# Patient Record
Sex: Female | Born: 1970 | Race: Black or African American | Hispanic: No | Marital: Single | Smoking: Current every day smoker
Health system: Southern US, Community
[De-identification: ages and names within clinical notes are randomized; demographics above are authoritative.]

## PROBLEM LIST (undated history)

## (undated) ENCOUNTER — Ambulatory Visit

## (undated) DIAGNOSIS — F32A Depression, unspecified: Secondary | ICD-10-CM

## (undated) DIAGNOSIS — Z789 Other specified health status: Secondary | ICD-10-CM

## (undated) DIAGNOSIS — M1990 Unspecified osteoarthritis, unspecified site: Secondary | ICD-10-CM

## (undated) DIAGNOSIS — F1090 Alcohol use, unspecified, uncomplicated: Secondary | ICD-10-CM

## (undated) DIAGNOSIS — F319 Bipolar disorder, unspecified: Secondary | ICD-10-CM

## (undated) DIAGNOSIS — E119 Type 2 diabetes mellitus without complications: Secondary | ICD-10-CM

## (undated) DIAGNOSIS — T7840XA Allergy, unspecified, initial encounter: Secondary | ICD-10-CM

## (undated) DIAGNOSIS — I10 Essential (primary) hypertension: Secondary | ICD-10-CM

## (undated) DIAGNOSIS — E785 Hyperlipidemia, unspecified: Secondary | ICD-10-CM

## (undated) DIAGNOSIS — F329 Major depressive disorder, single episode, unspecified: Secondary | ICD-10-CM

## (undated) DIAGNOSIS — Z7289 Other problems related to lifestyle: Secondary | ICD-10-CM

## (undated) DIAGNOSIS — Z720 Tobacco use: Secondary | ICD-10-CM

## (undated) DIAGNOSIS — F419 Anxiety disorder, unspecified: Secondary | ICD-10-CM

## (undated) DIAGNOSIS — O24419 Gestational diabetes mellitus in pregnancy, unspecified control: Secondary | ICD-10-CM

## (undated) HISTORY — DX: Bipolar disorder, unspecified: F31.9

## (undated) HISTORY — DX: Gestational diabetes mellitus in pregnancy, unspecified control: O24.419

## (undated) HISTORY — DX: Other specified health status: Z78.9

## (undated) HISTORY — DX: Depression, unspecified: F32.A

## (undated) HISTORY — PX: WISDOM TOOTH EXTRACTION: SHX21

## (undated) HISTORY — DX: Other problems related to lifestyle: Z72.89

## (undated) HISTORY — DX: Essential (primary) hypertension: I10

## (undated) HISTORY — DX: Tobacco use: Z72.0

## (undated) HISTORY — DX: Hyperlipidemia, unspecified: E78.5

## (undated) HISTORY — DX: Unspecified osteoarthritis, unspecified site: M19.90

## (undated) HISTORY — DX: Anxiety disorder, unspecified: F41.9

## (undated) HISTORY — DX: Alcohol use, unspecified, uncomplicated: F10.90

## (undated) HISTORY — DX: Allergy, unspecified, initial encounter: T78.40XA

## (undated) HISTORY — DX: Major depressive disorder, single episode, unspecified: F32.9

---

## 1998-06-01 ENCOUNTER — Other Ambulatory Visit: Admission: RE | Admit: 1998-06-01 | Discharge: 1998-06-01 | Payer: Self-pay | Admitting: *Deleted

## 1998-07-20 ENCOUNTER — Ambulatory Visit (HOSPITAL_COMMUNITY): Admission: RE | Admit: 1998-07-20 | Discharge: 1998-07-20 | Payer: Self-pay | Admitting: Obstetrics and Gynecology

## 1998-12-06 ENCOUNTER — Encounter: Admission: RE | Admit: 1998-12-06 | Discharge: 1999-03-06 | Payer: Self-pay | Admitting: Obstetrics and Gynecology

## 1999-01-06 ENCOUNTER — Encounter: Payer: Self-pay | Admitting: Obstetrics and Gynecology

## 1999-01-06 ENCOUNTER — Ambulatory Visit (HOSPITAL_COMMUNITY): Admission: RE | Admit: 1999-01-06 | Discharge: 1999-01-06 | Payer: Self-pay | Admitting: Obstetrics and Gynecology

## 1999-01-19 ENCOUNTER — Encounter: Payer: Self-pay | Admitting: Obstetrics and Gynecology

## 1999-01-19 ENCOUNTER — Ambulatory Visit (HOSPITAL_COMMUNITY): Admission: RE | Admit: 1999-01-19 | Discharge: 1999-01-19 | Payer: Self-pay | Admitting: Obstetrics and Gynecology

## 1999-02-03 ENCOUNTER — Inpatient Hospital Stay (HOSPITAL_COMMUNITY): Admission: AD | Admit: 1999-02-03 | Discharge: 1999-02-05 | Payer: Self-pay | Admitting: Obstetrics and Gynecology

## 2001-07-23 HISTORY — PX: CHOLECYSTECTOMY: SHX55

## 2001-10-31 ENCOUNTER — Emergency Department (HOSPITAL_COMMUNITY): Admission: EM | Admit: 2001-10-31 | Discharge: 2001-10-31 | Payer: Self-pay | Admitting: Emergency Medicine

## 2002-01-24 ENCOUNTER — Emergency Department (HOSPITAL_COMMUNITY): Admission: EM | Admit: 2002-01-24 | Discharge: 2002-01-24 | Payer: Self-pay | Admitting: *Deleted

## 2002-01-24 ENCOUNTER — Encounter: Payer: Self-pay | Admitting: Emergency Medicine

## 2002-02-04 ENCOUNTER — Encounter (INDEPENDENT_AMBULATORY_CARE_PROVIDER_SITE_OTHER): Payer: Self-pay | Admitting: *Deleted

## 2002-02-04 ENCOUNTER — Observation Stay (HOSPITAL_COMMUNITY): Admission: RE | Admit: 2002-02-04 | Discharge: 2002-02-05 | Payer: Self-pay | Admitting: *Deleted

## 2002-07-28 ENCOUNTER — Other Ambulatory Visit: Admission: RE | Admit: 2002-07-28 | Discharge: 2002-07-28 | Payer: Self-pay | Admitting: Obstetrics and Gynecology

## 2005-08-21 ENCOUNTER — Other Ambulatory Visit: Admission: RE | Admit: 2005-08-21 | Discharge: 2005-08-21 | Payer: Self-pay | Admitting: Obstetrics and Gynecology

## 2006-05-08 ENCOUNTER — Ambulatory Visit: Payer: Self-pay | Admitting: General Practice

## 2006-06-26 ENCOUNTER — Ambulatory Visit: Payer: Self-pay | Admitting: Internal Medicine

## 2006-06-26 LAB — CONVERTED CEMR LAB
ALT: 20 units/L (ref 0–40)
AST: 21 units/L (ref 0–37)
Albumin: 3.8 g/dL (ref 3.5–5.2)
Alkaline Phosphatase: 45 units/L (ref 39–117)
BUN: 12 mg/dL (ref 6–23)
Basophils Absolute: 0 10*3/uL (ref 0.0–0.1)
Basophils Relative: 0.4 % (ref 0.0–1.0)
Bilirubin Urine: NEGATIVE
CO2: 28 meq/L (ref 19–32)
Calcium: 9.5 mg/dL (ref 8.4–10.5)
Chloride: 104 meq/L (ref 96–112)
Chol/HDL Ratio, serum: 4.9
Cholesterol: 171 mg/dL (ref 0–200)
Creatinine, Ser: 0.9 mg/dL (ref 0.4–1.2)
Eosinophil percent: 1.9 % (ref 0.0–5.0)
GFR calc non Af Amer: 76 mL/min
Glomerular Filtration Rate, Af Am: 92 mL/min/{1.73_m2}
Glucose, Bld: 115 mg/dL — ABNORMAL HIGH (ref 70–99)
HCT: 38.8 % (ref 36.0–46.0)
HDL: 34.9 mg/dL — ABNORMAL LOW (ref >39.0)
Hemoglobin: 12.9 g/dL (ref 12.0–15.0)
Ketones, ur: NEGATIVE mg/dL
LDL Cholesterol: 117 mg/dL — ABNORMAL HIGH (ref 0–99)
Leukocytes, UA: NEGATIVE
Lymphocytes Relative: 35.6 % (ref 12.0–46.0)
MCHC: 33.2 g/dL (ref 30.0–36.0)
MCV: 95.4 fL (ref 78.0–100.0)
Monocytes Absolute: 0.6 10*3/uL (ref 0.2–0.7)
Monocytes Relative: 9.7 % (ref 3.0–11.0)
Neutro Abs: 3.5 10*3/uL (ref 1.4–7.7)
Neutrophils Relative %: 52.4 % (ref 43.0–77.0)
Nitrite: NEGATIVE
Platelets: 436 10*3/uL — ABNORMAL HIGH (ref 150–400)
Potassium: 4.1 meq/L (ref 3.5–5.1)
RBC: 4.06 M/uL (ref 3.87–5.11)
RDW: 11.7 % (ref 11.5–14.6)
Sodium: 138 meq/L (ref 135–145)
Specific Gravity, Urine: 1.025 (ref 1.000–1.03)
TSH: 1.49 microintl units/mL (ref 0.35–5.50)
Total Bilirubin: 0.9 mg/dL (ref 0.3–1.2)
Total Protein, Urine: NEGATIVE mg/dL
Total Protein: 7 g/dL (ref 6.0–8.3)
Triglyceride fasting, serum: 96 mg/dL (ref 0–149)
Urine Glucose: NEGATIVE mg/dL
Urobilinogen, UA: 0.2 (ref 0.0–1.0)
VLDL: 19 mg/dL (ref 0–40)
WBC: 6.5 10*3/uL (ref 4.5–10.5)
pH: 5.5 (ref 5.0–8.0)

## 2006-07-01 ENCOUNTER — Ambulatory Visit: Payer: Self-pay | Admitting: Internal Medicine

## 2006-07-15 ENCOUNTER — Ambulatory Visit: Payer: Self-pay | Admitting: Internal Medicine

## 2006-08-27 ENCOUNTER — Ambulatory Visit: Payer: Self-pay | Admitting: Internal Medicine

## 2006-10-29 ENCOUNTER — Ambulatory Visit: Payer: Self-pay | Admitting: Internal Medicine

## 2007-01-07 ENCOUNTER — Ambulatory Visit: Payer: Self-pay | Admitting: Internal Medicine

## 2007-02-06 ENCOUNTER — Inpatient Hospital Stay (HOSPITAL_COMMUNITY): Admission: RE | Admit: 2007-02-06 | Discharge: 2007-02-11 | Payer: Self-pay | Admitting: *Deleted

## 2007-02-06 ENCOUNTER — Ambulatory Visit: Payer: Self-pay | Admitting: *Deleted

## 2007-02-06 ENCOUNTER — Ambulatory Visit: Payer: Self-pay | Admitting: Internal Medicine

## 2007-02-12 ENCOUNTER — Other Ambulatory Visit (HOSPITAL_COMMUNITY): Admission: RE | Admit: 2007-02-12 | Discharge: 2007-03-27 | Payer: Self-pay | Admitting: Psychiatry

## 2007-03-14 DIAGNOSIS — F17200 Nicotine dependence, unspecified, uncomplicated: Secondary | ICD-10-CM

## 2007-03-14 DIAGNOSIS — Z720 Tobacco use: Secondary | ICD-10-CM | POA: Insufficient documentation

## 2007-03-24 ENCOUNTER — Ambulatory Visit: Payer: Self-pay | Admitting: Psychiatry

## 2007-04-14 DIAGNOSIS — E1165 Type 2 diabetes mellitus with hyperglycemia: Secondary | ICD-10-CM | POA: Insufficient documentation

## 2007-05-14 ENCOUNTER — Ambulatory Visit: Payer: Self-pay | Admitting: General Practice

## 2007-05-14 DIAGNOSIS — F319 Bipolar disorder, unspecified: Secondary | ICD-10-CM

## 2007-05-29 ENCOUNTER — Ambulatory Visit: Payer: Self-pay | Admitting: Internal Medicine

## 2007-05-29 DIAGNOSIS — M25569 Pain in unspecified knee: Secondary | ICD-10-CM

## 2007-05-29 DIAGNOSIS — F1010 Alcohol abuse, uncomplicated: Secondary | ICD-10-CM | POA: Insufficient documentation

## 2007-05-29 LAB — CONVERTED CEMR LAB
BUN: 8 mg/dL (ref 6–23)
CO2: 27 meq/L (ref 19–32)
Calcium: 9.1 mg/dL (ref 8.4–10.5)
Chloride: 104 meq/L (ref 96–112)
Creatinine, Ser: 0.7 mg/dL (ref 0.4–1.2)
GFR calc Af Amer: 122 mL/min
GFR calc non Af Amer: 101 mL/min
Glucose, Bld: 95 mg/dL (ref 70–99)
Hgb A1c MFr Bld: 6.3 % — ABNORMAL HIGH (ref 4.6–6.0)
Potassium: 4.3 meq/L (ref 3.5–5.1)
Sodium: 138 meq/L (ref 135–145)
TSH: 1.21 microintl units/mL (ref 0.35–5.50)

## 2007-06-06 ENCOUNTER — Encounter (INDEPENDENT_AMBULATORY_CARE_PROVIDER_SITE_OTHER): Payer: Self-pay | Admitting: *Deleted

## 2007-07-10 ENCOUNTER — Ambulatory Visit: Payer: Self-pay | Admitting: Internal Medicine

## 2007-12-31 ENCOUNTER — Encounter: Payer: Self-pay | Admitting: Internal Medicine

## 2008-03-02 ENCOUNTER — Ambulatory Visit: Payer: Self-pay | Admitting: Internal Medicine

## 2008-03-04 ENCOUNTER — Telehealth: Payer: Self-pay | Admitting: Internal Medicine

## 2008-03-09 ENCOUNTER — Encounter: Payer: Self-pay | Admitting: Internal Medicine

## 2008-04-22 ENCOUNTER — Encounter: Payer: Self-pay | Admitting: Internal Medicine

## 2009-03-10 ENCOUNTER — Encounter: Admission: RE | Admit: 2009-03-10 | Discharge: 2009-03-10 | Payer: Self-pay | Admitting: Orthopedic Surgery

## 2009-08-05 ENCOUNTER — Ambulatory Visit: Payer: Self-pay | Admitting: Internal Medicine

## 2009-10-06 ENCOUNTER — Encounter: Payer: Self-pay | Admitting: Internal Medicine

## 2009-10-18 ENCOUNTER — Ambulatory Visit: Payer: Self-pay | Admitting: Internal Medicine

## 2009-10-18 LAB — CONVERTED CEMR LAB
Platelets: 447 10*3/uL
WBC, blood: 6.2 10*3/uL

## 2009-10-20 ENCOUNTER — Telehealth: Payer: Self-pay | Admitting: Internal Medicine

## 2010-01-12 ENCOUNTER — Encounter: Payer: Self-pay | Admitting: Internal Medicine

## 2010-01-12 ENCOUNTER — Telehealth: Payer: Self-pay | Admitting: Internal Medicine

## 2010-01-17 ENCOUNTER — Ambulatory Visit (HOSPITAL_BASED_OUTPATIENT_CLINIC_OR_DEPARTMENT_OTHER): Admission: RE | Admit: 2010-01-17 | Discharge: 2010-01-17 | Payer: Self-pay | Admitting: Internal Medicine

## 2010-01-17 ENCOUNTER — Ambulatory Visit: Payer: Self-pay | Admitting: Internal Medicine

## 2010-01-17 ENCOUNTER — Ambulatory Visit: Payer: Self-pay | Admitting: Interventional Radiology

## 2010-01-17 ENCOUNTER — Telehealth: Payer: Self-pay | Admitting: Internal Medicine

## 2010-01-17 DIAGNOSIS — M545 Low back pain: Secondary | ICD-10-CM

## 2010-01-17 DIAGNOSIS — R609 Edema, unspecified: Secondary | ICD-10-CM | POA: Insufficient documentation

## 2010-04-17 ENCOUNTER — Ambulatory Visit: Payer: Self-pay | Admitting: Internal Medicine

## 2010-05-31 ENCOUNTER — Encounter: Payer: Self-pay | Admitting: Internal Medicine

## 2010-06-20 ENCOUNTER — Encounter: Payer: Self-pay | Admitting: Internal Medicine

## 2010-08-07 ENCOUNTER — Encounter: Payer: Self-pay | Admitting: Internal Medicine

## 2010-08-07 ENCOUNTER — Ambulatory Visit
Admission: RE | Admit: 2010-08-07 | Discharge: 2010-08-07 | Payer: Self-pay | Source: Home / Self Care | Attending: Internal Medicine | Admitting: Internal Medicine

## 2010-08-07 LAB — CONVERTED CEMR LAB
BUN: 10 mg/dL (ref 6–23)
Calcium: 9.2 mg/dL (ref 8.4–10.5)
Creatinine, Ser: 0.84 mg/dL (ref 0.40–1.20)
Creatinine, Urine: 169.7 mg/dL
Microalb Creat Ratio: 3.5 mg/g (ref 0.0–30.0)

## 2010-08-08 ENCOUNTER — Encounter: Payer: Self-pay | Admitting: Internal Medicine

## 2010-08-20 LAB — CONVERTED CEMR LAB
BUN: 23 mg/dL
Calcium: 9.5 mg/dL
Cholesterol: 163 mg/dL
HDL: 46 mg/dL
LDL Cholesterol: 100 mg/dL
LDL/HDL Ratio: 100
TSH: 1.28 microintl units/mL
Triglyceride fasting, serum: 87 mg/dL

## 2010-08-24 NOTE — Progress Notes (Signed)
Summary: Rx.  Phone Note Refill Request Message from:  Patient on October 20, 2009 11:09 AM  Refills Requested: Medication #1:  VOLTAREN 1 % GEL apply three times a day as directed.   Dosage confirmed as above?Dosage Confirmed Her insurance wants her to use the pill instead of gel.Marland KitchenMarland KitchenMarland KitchenPlease call pharmacy, so pt. can pick up.   Initial call taken by: Michaelle Copas,  October 20, 2009 11:09 AM  Follow-up for Phone Call        I suggest she use over the counter NSAIDs - iburofen or aleve.  use sparingly.  take with food and plenty of fluids.  avoid long term use - > 2 weeks Follow-up by: D. Thomos Lemons DO,  October 20, 2009 11:45 AM  Additional Follow-up for Phone Call Additional follow up Details #1::        Called and informed pt. of Dr.Leotha Westermeyer's directions Additional Follow-up by: Michaelle Copas,  October 20, 2009 11:56 AM

## 2010-08-24 NOTE — Assessment & Plan Note (Signed)
Summary: [name]   Vital Signs:  Patient profile:   40 year old female Height:      67 inches Weight:      255.25 pounds BMI:     40.12 O2 Sat:      98 % on Room air Temp:     98.2 degrees F oral Pulse rate:   72 / minute Pulse rhythm:   regular Resp:     16 per minute BP sitting:   128 / 80  (right arm) Cuff size:   large  Vitals Entered By: Tanya Wilcox CMA (January 17, 2010 10:08 AM)  O2 Flow:  Room air CC: Rm 2 Right foot swelling Is Patient Diabetic? No   Primary Care Provider:  Dondra Spry DO  CC:  Rm 2 Right foot swelling.  History of Present Illness: right foot swelling for the past month , pian with extension and rotation, pain has improved, swelling does occur daily, some elevation and rubbing of alchol with relief, Lamotrigine will increaase to 300mg  and refill on test strips needed to local and mail order  Preventive Screening-Counseling & Management  Alcohol-Tobacco     Smoking Status: current  Allergies: No Known Drug Allergies  Past History:  Past Medical History: Bipolar Alcohol use  History of gestational diabetes  Tobacco abuse   History of cannabis use Borderline DM II Left knee osteoarthritis  Past Surgical History: Cholecystectomy - 2003     Family History: Mother has hypertension and high cholesterol. Father deceased age 36 - history of alcohol abuse ( died in MVA )      Social History: Single Current Smoker - 1/4 pack day  Occupation:[employer]  Alcohol - occasionally Drug use-no     Review of Systems       right sided low back pain.  no dysuria.  " I can feel it when I urinate".  no fever or chills  Physical Exam  General:  alert, well-developed, and well-nourished.   Lungs:  normal respiratory effort and normal breath sounds.   Heart:  normal rate, regular rhythm, and no gallop.   Abdomen:  soft, non-tender, and normal bowel sounds.   no flank tenderness Extremities:  trace left pedal edema and 1+  right pedal edema.     Impression & Recommendations:  Problem # 1:  EDEMA (ICD-782.3) pt c/o right lower ext / ankle swelling.  she denies trauma or injury.  probable venous insuff.  rule out DVT rx for compression stocking provided Orders: LE Venous Duplex (DVT) (DVT)  Problem # 2:  DIABETES MELLITUS, TYPE II, BORDERLINE (ICD-790.29) some wt gain since prev visit.  she sometimes forgets to take evening dose.  she has pill box Pt counseled on diet and exercise.  Her updated medication list for this problem includes:    Metformin Hcl 1000 Mg Tabs (Metformin hcl) ..... One by mouth bid  Orders: T-Basic Metabolic Panel [id number]) T- Hemoglobin A1C (09811-91478) T-Urinalysis (81003-65000)  Problem # 3:  BIPOLAR AFFECTIVE DISORDER (ICD-296.80) pt c/o fatigue.  lamictal titrated to 300 mg by pysch.  rule to anemia.  lamictal can be assoc with pancytopenia Orders: T-CBC w/Diff ([id number])  Problem # 4:  BACK PAIN, RIGHT (ICD-724.5) intermittent right sided back pain. probable spondylosis.  use OTC analgesics as needed check u/a  Complete Medication List: 1)  Abilify 15 Mg Tabs (Aripiprazole) .... Take 1 tablet by mouth once a day 2)  Onetouch Ultra Test Strp (Glucose blood) .... Test daily  3)  Metformin Hcl 1000 Mg Tabs (Metformin hcl) .... One by mouth bid 4)  Zolpidem Tartrate 5 Mg Tabs (Zolpidem tartrate) .... One by mouth at bedtime prn 5)  Lamotrigine 200 Mg Tabs (Lamotrigine) .... One by mouth once daily 6)  Voltaren 1 % Gel (Diclofenac sodium) .... Apply three times a day as directed  Patient Instructions: 1)  www.diabetes.org 2)  Limit carbs to 30 grams per meal 3)  Keep your Sept appointment 4)  Limit your sodium intake 2.5 grams per day Prescriptions: METFORMIN HCL 1000 MG TABS (METFORMIN HCL) one by mouth bid  #180 x 1   Entered and Authorized by:   D. Thomos Lemons DO   Signed by:   D. Thomos Lemons DO on 01/17/2010   Method used:   Electronically to         [facility]* (mail-order)       [facility]       [address]       [place], [place]  [address]       Ph: [phone]       Fax: [phone]   RxID:   [id number] [place] TEST   STRP (GLUCOSE BLOOD) test daily  #100 x 3   Entered and Authorized by:   D. Thomos Lemons DO   Signed by:   D. Thomos Lemons DO on 01/17/2010   Method used:   Electronically to        [facility]* (mail-order)       [facility]       [address]       [place], [place]  [address]       Ph: [phone]       Fax: [phone]   RxID:   [id number]

## 2010-08-24 NOTE — Letter (Signed)
   [place] [facility] [address] [place], [place]  [address]  [place] Phone: [phone]      August 08, 2010   [facility] [address] [place], [place] [address]  RE:  LAB RESULTS  Dear  Ms. Agresta,  The following is an interpretation of your most recent lab tests.  Please take note of any instructions provided or changes to medications that have resulted from your lab work.  ELECTROLYTES:  Good - no changes needed  KIDNEY FUNCTION TESTS:  Good - no changes needed    DIABETIC STUDIES:  Fair - schedule a follow-up appointment Blood Glucose: 132   HgbA1C: 7.1   Microalbumin/Creatinine Ratio: 3.5          Sincerely Yours,    Dr. Thomos Lemons  Appended Document:  mailed

## 2010-08-24 NOTE — Assessment & Plan Note (Signed)
Summary: 6 MONTH FOLLOW UP/MHF   Vital Signs:  Patient profile:   40 year old female Weight:      258 pounds BMI:     40.55 O2 Sat:      100 % on Room air Temp:     98.4 degrees F oral Pulse rate:   69 / minute Pulse rhythm:   regular Resp:     20 per minute BP sitting:   120 / 68  (right arm) Cuff size:   large  Vitals Entered By: Glendell Docker CMA (April 17, 2010 3:24 PM)  O2 Flow:  Room air CC: 6 month follow up  Is Patient Diabetic? No Pain Assessment Patient in pain? no      Comments flu shot to be given with employer   Primary Care Provider:  D. Thomos Lemons DO  CC:  6 month follow up .  History of Present Illness: 40 y/o AA female with hx of bipolar and borderline DM for f/u some wt gain since prev visit  Current Diet: Breakfast:  [place] sandwich Lunch: [place] sandwich, chips and soft drink DInner: chicken breast , [place],  spaghetti,  hamburger helper Snacks: infrequent Beverage: soft drinks     Preventive Screening-Counseling & Management  Alcohol-Tobacco     Smoking Status: current  Allergies (verified): No Known Drug Allergies  Past History:  Past Medical History: Bipolar Alcohol use  History of gestational diabetes   Tobacco abuse   History of cannabis use Borderline DM II Left knee osteoarthritis  Social History: Single (daughter - age 39) [employer] Current Smoker - 1/4 pack day  Occupation: [employer]  Alcohol - occasionally Drug use-no      Physical Exam  General:  alert, well-developed, and well-nourished.   Lungs:  normal respiratory effort and normal breath sounds.   Heart:  normal rate, soft blowing murmur LSB   Impression & Recommendations:  Problem # 1:  DIABETES MELLITUS, TYPE II, BORDERLINE (ICD-790.29) recent A1c reported at 6.1 Pt counseled on diet and exercise.  Her updated medication list for this problem includes:    Metformin Hcl 1000 Mg Tabs (Metformin hcl) ..... One by mouth bid  Labs  Reviewed: Creat: 0.7 (05/29/2007)     Last Eye Exam: normal (01/12/2010)  Problem # 2:  BIPOLAR AFFECTIVE DISORDER (ICD-296.80) depakote may be contributing to wt gain  Complete Medication List: 1)  Onetouch Ultra Test Strp (Glucose blood) .... Test daily 2)  Metformin Hcl 1000 Mg Tabs (Metformin hcl) .... One by mouth bid 3)  Zolpidem Tartrate 5 Mg Tabs (Zolpidem tartrate) .... One by mouth at bedtime prn 4)  Lamictal 25 Mg Tabs (Lamotrigine) .... 2 tablets by mouth at bedtime 5)  Voltaren 1 % Gel (Diclofenac sodium) .... Apply three times a day as directed 6)  Wellbutrin Xl 300 Mg Xr24h-tab (Bupropion hcl) .... Take 1 tablet by mouth every morning 7)  Depakote 500 Mg Tbec (Divalproex sodium) .... 2 tablets by mouth at bedtime  Patient Instructions: 1)  Please schedule a follow-up appointment in 4 months.  Current Allergies (reviewed today): No known allergies

## 2010-08-24 NOTE — Miscellaneous (Signed)
Summary: Orders Update  Clinical Lists Changes  Orders: Added new Test order of TLB-BMP (Basic Metabolic Panel-BMET) (80048-METABOL) - Signed Added new Test order of TLB-A1C / Hgb A1C (Glycohemoglobin) (83036-A1C) - Signed 

## 2010-08-24 NOTE — Progress Notes (Signed)
Summary: Test Results  Phone Note Outgoing Call   Summary of Call: call pt - lower ext doppler negative for blood clot Initial call taken by: D. Thomos Lemons DO,  January 17, 2010 5:37 PM  Follow-up for Phone Call        patient advised per Dr Artist Pais instructions Follow-up by: Glendell Docker CMA,  January 18, 2010 8:28 AM

## 2010-08-24 NOTE — Assessment & Plan Note (Signed)
Summary: Medication refills/hea   Vital Signs:  Patient profile:   40 year old female Height:      67 inches Weight:      244 pounds BMI:     38.35 O2 Sat:      100 % on Room air Temp:     98.1 degrees F oral Pulse rate:   82 / minute Pulse rhythm:   regular Resp:     18 per minute BP sitting:   120 / 76  (right arm) Cuff size:   large  Vitals Entered By: Glendell Docker CMA (August 05, 2009 2:30 PM)  O2 Flow:  Room air  Primary Care Provider:  D. Thomos Lemons DO  CC:  Follow up Medication.  History of Present Illness: Follow up medication  40 y/o AA female with hx of mood d/o (bipolar) and borerline diabetes for f/u she has not taken any medication for the past 6 months she is monitoring her blood sugar.   CBG yesterday - 112  Preventive Screening-Counseling & Management  Alcohol-Tobacco     Smoking Status: current      Drug Use:  no.    Allergies (verified): No Known Drug Allergies  Past History:  Past Medical History: Bipolar Alcohol use  History of gestational diabetes Tobacco abuse  History of cannabis use  Past Surgical History: Cholecystectomy - 2003    Family History: Mother has hypertension and high cholesterol. Father deceased age 21 - history of alcohol abuse ( died in MVA )   Social History: Single Current Smoker - 1/4 pack day Occupation:[employer]  Alcohol - occasionally Drug use-no Drug Use:  no  Review of Systems       chronic insomnia  Physical Exam  General:  alert and overweight-appearing.   Neck:  supple and no masses.   Lungs:  normal respiratory effort and normal breath sounds.   Heart:  normal rate, regular rhythm, no murmur, and no gallop.   Extremities:  No lower extremity edema' Neurologic:  cranial nerves II-XII intact and gait normal.   Psych:  normally interactive, good eye contact, and slightly anxious.     Impression & Recommendations:  Problem # 1:  BIPOLAR AFFECTIVE DISORDER (ICD-296.80) Pt stopped  sertraline on her own.   she noticed increased tearfulness and irritability.  Restart sertraline.  arrange f/u with psych for long term mgt.  med compliance encouraged.  no new life stressor.  Orders: Psychiatric Referral (Psych)  Problem # 2:  DIABETES MELLITUS, TYPE II, BORDERLINE (ICD-790.29) resume metformin.  monitor a1c.  Her updated medication list for this problem includes:    Metformin Hcl 500 Mg Tabs (Metformin hcl) ..... One by mouth bid  Labs Reviewed: Creat: 0.7 (05/29/2007)     Problem # 3:  TOBACCO ABUSE (ICD-305.1)  Complete Medication List: 1)  Abilify 5 Mg Tabs (Aripiprazole) .... One by mouth at bedtime 2)  Sertraline Hcl 50 Mg Tabs (Sertraline hcl) .... One half by mouth once daily x 7 days, then one by mouth qd 3)  Onetouch Ultra Test Strp (Glucose blood) .... Test daily 4)  Metformin Hcl 500 Mg Tabs (Metformin hcl) .... One by mouth bid 5)  Zolpidem Tartrate 5 Mg Tabs (Zolpidem tartrate) .... One by mouth at bedtime prn  Patient Instructions: 1)  Please schedule a follow-up appointment in 2 months. Prescriptions: ZOLPIDEM TARTRATE 5 MG TABS (ZOLPIDEM TARTRATE) one by mouth at bedtime prn  #30 x 1   Entered and Authorized by:  Dondra Spry DO   Signed by:   D. Thomos Lemons DO on 08/05/2009   Method used:   Print then Give to Patient   RxID:   [id number] SERTRALINE HCL 50 MG TABS (SERTRALINE HCL) one half by mouth once daily x 7 days, then one by mouth qd  #30 x 2   Entered and Authorized by:   D. Thomos Lemons DO   Signed by:   D. Thomos Lemons DO on 08/05/2009   Method used:   Print then Give to Patient   RxID:   [id number] METFORMIN HCL 500 MG TABS (METFORMIN HCL) one by mouth bid  #180 x 1   Entered and Authorized by:   D. Thomos Lemons DO   Signed by:   D. Thomos Lemons DO on 08/05/2009   Method used:   Print then Give to Patient   RxID:   [id number]   Current Allergies (reviewed today): No known allergies    Immunization  History:  Influenza Immunization History:    Influenza:  historical (04/12/2009)

## 2010-08-24 NOTE — Letter (Signed)
Summary: [facility]   Imported By: Lanelle Bal 01/19/2010 16:17:09  _____________________________________________________________________  External Attachment:    Type:   Image     Comment:   External Document

## 2010-08-24 NOTE — Miscellaneous (Signed)
Summary: eye exam update  Clinical Lists Changes  Observations: Added new observation of DMEYEEXAMNXT: 01/2011 (01/12/2010 14:02) Added new observation of DMEYEEXMRES: normal (01/12/2010 14:02) Added new observation of EYE EXAM BY: Fredrich Birks, OD [phone] (01/12/2010 14:02) Added new observation of DIAB EYE EX: normal (01/12/2010 14:02)        Diabetes Management Exam:    Eye Exam:       Eye Exam done elsewhere          Date: 01/12/2010          Results: normal          Done by: Fredrich Birks, OD [phone]

## 2010-08-24 NOTE — Assessment & Plan Note (Signed)
Summary: 2 mo. f/u, cpx-jr   Vital Signs:  Patient profile:   40 year old female Height:      67 inches Weight:      251 pounds BMI:     39.45 O2 Sat:      100 % on Room air Temp:     98.6 degrees F oral Pulse rate:   73 / minute Pulse rhythm:   regular Resp:     16 per minute BP sitting:   114 / 70  (right arm) Cuff size:   large  Vitals Entered By: Glendell Docker CMA (October 18, 2009 2:35 PM)  O2 Flow:  Room air  Primary Care Provider:  D. Thomos Lemons DO   History of Present Illness: 40 y/o with bipolar, abnormal glucose for routine cpx she reestablished with Dr. Tomasa Rand lamictal added. she is feeling better  DM II borderline.  she is having tough time controlling her appetite reviewed labs  a1c stable at 6.1 lipids - LDL < 100  Preventive Screening-Counseling & Management  Alcohol-Tobacco     Smoking Status: current  Allergies (verified): No Known Drug Allergies  Past History:  Past Medical History: Bipolar Alcohol use  History of gestational diabetes Tobacco abuse   History of cannabis use Borderline DM II Left knee osteoarthritis  Family History: Mother has hypertension and high cholesterol. Father deceased age 34 - history of alcohol abuse ( died in MVA )     Social History: Single Current Smoker - 1/4 pack day  Occupation:[employer]  Alcohol - occasionally Drug use-no   Review of Systems  The patient denies weight loss, weight gain, chest pain, syncope, dyspnea on exertion, headaches, abdominal pain, melena, hematochezia, and severe indigestion/heartburn.         intermittent left knee pain.  better with voltaren gel.  she was seen by ortho - knee pain from DJD  Physical Exam  General:  alert and overweight-appearing.   Head:  normocephalic and atraumatic.   Eyes:  pupils equal, pupils round, and pupils reactive to light.   Ears:  R ear normal and L ear normal.   Mouth:  Oral mucosa and oropharynx without lesions or exudates.  Teeth  in good repair. Neck:  No deformities, masses, or tenderness noted. Lungs:  Normal respiratory effort, chest expands symmetrically. Lungs are clear to auscultation, no crackles or wheezes. Heart:  Normal rate and regular rhythm. S1 and S2 normal without gallop, murmur, click, rub or other extra sounds. Abdomen:  obese, soft, non-tender, normal bowel sounds, no masses, no hepatomegaly, and no splenomegaly.   Extremities:  No lower extremity edema  Neurologic:  cranial nerves II-XII intact and gait normal.     Impression & Recommendations:  Problem # 1:  PREVENTIVE HEALTH CARE (ICD-V70.0) Reviewed adult health maintenance protocols.  pt counseled to quit smoking - 3-5 minutes.  she is planning on using nicotine patches.  nicotine lozenges cause sore throat  Pap smear: normal (09/06/2009) Flu Vax: Historical (04/12/2009)   Pneumovax: Pneumovax (05/29/2007) Chol: 171 (06/26/2006)   HDL: 34.9 (06/26/2006)   LDL: 117 (06/26/2006)   TG: 96 (06/26/2006) TSH: 1.21 (05/29/2007)   HgbA1C: 6.3 (05/29/2007)     Problem # 2:  DIABETES MELLITUS, TYPE II, BORDERLINE (ICD-790.29) A1c 6.1.  increase metformin to 1 gram by mouth two times a day.  carb counting guide provide.  limit carbs to  Her updated medication list for this problem includes:    Metformin Hcl 1000 Mg Tabs (Metformin hcl) .Marland KitchenMarland KitchenMarland KitchenMarland Kitchen  One by mouth bid  Labs Reviewed: Creat: 0.7 (05/29/2007)     Complete Medication List: 1)  Abilify 15 Mg Tabs (Aripiprazole) .... Take 1 tablet by mouth once a day 2)  Onetouch Ultra Test Strp (Glucose blood) .... Test daily 3)  Metformin Hcl 1000 Mg Tabs (Metformin hcl) .... One by mouth bid 4)  Zolpidem Tartrate 5 Mg Tabs (Zolpidem tartrate) .... One by mouth at bedtime prn 5)  Lamotrigine 200 Mg Tabs (Lamotrigine) .... One by mouth once daily 6)  Voltaren 1 % Gel (Diclofenac sodium) .... Apply three times a day as directed  Patient Instructions: 1)  Please schedule a follow-up appointment in 6  months. 2)  BMP prior to visit, ICD-9:  790.29 3)  HbgA1C prior to visit, ICD-9:  790.29 4)  [email or web address] 5)  Please return for lab work one (1) week before your next appointment.  Prescriptions: METFORMIN HCL 1000 MG TABS (METFORMIN HCL) one by mouth bid  #180 x 1   Entered and Authorized by:   D. Thomos Lemons DO   Signed by:   D. Thomos Lemons DO on 10/18/2009   Method used:   Electronically to        [facility] [address] [phone]* (retail)       [address] [place], [place]  [address]       Ph: [phone]       Fax: [phone]   RxID:   [id number] VOLTAREN 1 % GEL (DICLOFENAC SODIUM) apply three times a day as directed  #3 pk x 5   Entered and Authorized by:   D. Thomos Lemons DO   Signed by:   D. Thomos Lemons DO on 10/18/2009   Method used:   Electronically to        [facility] [address] [phone]* (retail)       [address] [place], [place]  [address]       Ph: [phone]       Fax: [phone]   RxID:   [id number] METFORMIN HCL 1000 MG TABS (METFORMIN HCL) one by mouth bid  #60 x 5   Entered and Authorized by:   D. Thomos Lemons DO   Signed by:   D. Thomos Lemons DO on 10/18/2009   Method used:   Electronically to        [facility] [address] [phone]* (retail)       [address] [place], [place]  [address]       Ph: [phone]       Fax: [phone]   RxID:   [id number]    Preventive Care Screening  Pap Smear:    Date:  09/06/2009    Results:  normal      Current Allergies (reviewed today): No known allergies    Appended Document: Orders Update    Clinical Lists Changes  Orders: Added new Service order of Tdap => 79yrs IM (37169) - Signed Added new Service order of Admin 1st Vaccine (67893) - Signed Observations: Added new observation of TD BOOST VIS: 06/10/07 version given October 18, 2009. (10/18/2009 15:44) Added new observation of TD BOOSTERLO: [id number] (10/18/2009  15:44) Added new observation of TD BOOST EXP: 09/17/2011 (10/18/2009 15:44) Added new observation of TD BOOSTERBY: Glendell Docker CMA (10/18/2009 15:44) Added new observation of TD BOOSTERRT: IM (10/18/2009 15:44) Added new observation of TDBOOSTERDSE: 0.5 ml (10/18/2009 15:44) Added  new observation of TD BOOSTERMF: GlaxoSmithKline (10/18/2009 15:44) Added new observation of TD BOOST SIT: left deltoid (10/18/2009 15:44) Added new observation of TD BOOSTER: Tdap (10/18/2009 15:44)       Immunizations Administered:  Tetanus Vaccine:    Vaccine Type: Tdap    Site: left deltoid    Mfr: GlaxoSmithKline    Dose: 0.5 ml    Route: IM    Given by: Glendell Docker CMA    Exp. Date: 09/17/2011    Lot #: [id number]    VIS given: 06/10/07 version given October 18, 2009.

## 2010-08-24 NOTE — Miscellaneous (Signed)
Summary: Lab Results  Clinical Lists Changes  Observations: Added new observation of PLATELETS: 447 10*3/mm3 (10/18/2009 15:47) Added new observation of HCT: 38.7 % (10/18/2009 15:47) Added new observation of HGB: 13.3 g/dL (84/69/6295 28:41) Added new observation of WBC: 6.2 10*3/mm3 (10/18/2009 15:47) Added new observation of TSH: 1.280 microintl units/mL (10/06/2009 16:04) Added new observation of CALCIUM: 9.5 mg/dL (32/44/0102 72:53) Added new observation of BUN: 23 mg/dL (66/44/0347 42:59) Added new observation of CO2 TOTAL: 22 mmol/L (10/06/2009 16:00) Added new observation of CHLORIDE: 100 mmol/L (10/06/2009 16:00) Added new observation of POTASSIUM: 4.7 mmol/L (10/06/2009 16:00) Added new observation of SODIUM: 136 mmol/L (10/06/2009 16:00) Added new observation of HGBA1C: 6.1 % (10/06/2009 15:54) Added new observation of C-LDL/C-HDL: 100  (10/06/2009 15:54) Added new observation of TRIGLYCERIDE: 87 mg/dL (56/38/7564 33:29) Added new observation of HDL: 46 mg/dL (51/88/4166 06:30) Added new observation of LDL: 100 mg/dL (16/07/930 35:57) Added new observation of CHOLESTEROL: 163 mg/dL (32/20/2542 70:62)      Lipid Panel Test Date: 10/06/2009                        Value        Units        H/L   Reference  Cholesterol:          163          mg/dL              (376-283) LDL Cholesterol:      100          mg/dL              (15-176) HDL Cholesterol:      46           mg/dL              (16-07) Triglyceride:         87           mg/dL              (37-106) LDL/HDL ratio:        100                        H    (2-5) HgBA1c                6.1                              Lab name:             [facility]    Lab Results  CBC               Normals    WBC:     6.2   x10E3/uL   (4.8 - 10.8)     HGB:     13.3   g/dL     (26.9 - 48.5)    HCT:     38.7   %     (37 - 47)    PLT:     447   x10E3/uL   (130 - 440)      Lab Results  CBC               Normals    WBC:      6.2   x10E3/uL   (4.8 - 10.8)     HGB:     13.3  g/dL     (40.9 - 81.1)    HCT:     38.7   %     (37 - 47)    PLT:     447   x10E3/uL   (130 - 440)          Chemistry Labs Test Date: 10/06/2009                      Value Units        H/L   Reference  Sodium:             136   mmol/L        L    (137-145) Potassium:          4.7   mmol/L             (3.6-5.0) Chloride:           100   mmol/L        L    (101-111) CO2:                22    mmol/L             (22-31) BUN:                22    mg/dL              (9-14) Creatinine:         0.96  mg/dL              (7.8-2.9) Glucose-fasting:    107   mg/dL              (56-213) Calcium (total):    9.5   mg/dL              (0-86.5)  Lab name:           [facility]      Lab Entry Test Date: 10/06/2009                        Value        Units        H/L   Reference  TSH:                  1.280        mIU/ml             (0.5-5.0)  Lab name:             [facility]

## 2010-08-24 NOTE — Medication Information (Signed)
Summary: Nonadherence with Metformin/[facility]  Nonadherence with Metformin/[facility]   Imported By: Lanelle Bal 07/20/2010 15:05:14  _____________________________________________________________________  External Attachment:    Type:   Image     Comment:   External Document

## 2010-08-24 NOTE — Progress Notes (Signed)
Summary: labs mailed  Phone Note Call from Patient Call back at [phone]   Caller: Patient Summary of Call: Pt called and requested that lab results from March be mailed to her home address.  Results mailed. Attempted to call pt at home and notify her that labs had been mailed but received poor connection and call was lost.  Mervin Kung CMA  January 12, 2010 5:32 PM

## 2010-08-29 LAB — HM PAP SMEAR: HM Pap smear: NORMAL

## 2010-08-30 NOTE — Assessment & Plan Note (Signed)
Summary: 4 MONTH FOLLOW UP/MHF   Vital Signs:  Patient profile:   40 year old female Height:      67 inches Weight:      266.50 pounds BMI:     41.89 O2 Sat:      100 % on Room air Temp:     98.2 degrees F oral Pulse rate:   69 / minute Resp:     18 per minute BP sitting:   122 / 80  (right arm) Cuff size:   large  Vitals Entered By: Glendell Docker CMA (August 07, 2010 10:41 AM)  O2 Flow:  Room air CC: 4 Month Follow up  Is Patient Diabetic? No Pain Assessment Patient in pain? no      Comments concerned with elevated blood sugar greater than 300, in morning 160-170, after meals runs about 260 and up, 7 day avg 177 & 30 day average 193, non-fasting this am   Primary Care Provider:  Dondra Spry DO  CC:  4 Month Follow up .  History of Present Illness: 40 y/o female for f/u re:  dM II blood sugar much higher than normal she denies signs or symptoms of infection sub optimal diet  Preventive Screening-Counseling & Management  Alcohol-Tobacco     Smoking Status: current  Allergies (verified): No Known Drug Allergies  Past History:  Past Medical History: Bipolar Alcohol use  History of gestational diabetes   Tobacco abuse    History of cannabis use Borderline DM II Left knee osteoarthritis  Past Surgical History: Cholecystectomy - 2003      Family History: Mother has hypertension and high cholesterol. Father deceased age 81 - history of alcohol abuse ( died in MVA )       Social History: Single (daughter - age 73) [facility] Current Smoker - 1/4 pack day  Occupation: [employer]  Alcohol - occasionally  Drug use-no      Physical Exam  General:  alert, well-developed, and well-nourished.   Lungs:  normal respiratory effort and normal breath sounds.   Heart:  normal rate, regular rhythm, and no gallop.   Extremities:  trace left pedal edema and trace right pedal edema.     Impression & Recommendations:  Problem # 1:  DIABETES MELLITUS, TYPE II  (ICD-250.00) Assessment Deteriorated add victoza we discussed potential side effects  Her updated medication list for this problem includes:    Metformin Hcl 1000 Mg Tabs (Metformin hcl) ..... One by mouth bid    Victoza 18 Mg/64ml Soln (Liraglutide) ..... Inject 1.2 mg subcutaneously once daily  Labs Reviewed: Creat: 0.7 (05/29/2007)     Last Eye Exam: normal (01/12/2010) Reviewed HgBA1c results: 6.1 (10/06/2009)  6.3 (05/29/2007)  Complete Medication List: 1)  Onetouch Ultra Test Strp (Glucose blood) .... Test daily 2)  Metformin Hcl 1000 Mg Tabs (Metformin hcl) .... One by mouth bid 3)  Lamictal 25 Mg Tabs (Lamotrigine) .... 2 tablets by mouth at bedtime 4)  Wellbutrin Xl 300 Mg Xr24h-tab (Bupropion hcl) .... Take 1 tablet by mouth every morning 5)  Depakote 500 Mg Tbec (Divalproex sodium) .... 3  tablets by mouth at bedtime 6)  Victoza 18 Mg/31ml Soln (Liraglutide) .... Inject 1.2 mg subcutaneously once daily 7)  Bd Pen Needle Short U/f 31g X 8 Mm Misc (Insulin pen needle) .... Use once daily as directed  Other Orders: T-Basic Metabolic Panel [phone]) T- Hemoglobin A1C ([id number]) T-Urine Microalbumin w/creat. ratio [phone])  Patient Instructions: 1)  Please schedule a  follow-up appointment in 1 month. 2)  Limit your carbs to 25-30 grams per meal Prescriptions: BD PEN NEEDLE SHORT U/F 31G X 8 MM MISC (INSULIN PEN NEEDLE) use once daily as directed  #30 x 0   Entered and Authorized by:   D. Thomos Lemons DO   Signed by:   D. Thomos Lemons DO on 08/07/2010   Method used:   Electronically to        [facility] [phone]* (retail)       [address] [place], [place]  [address]       Ph: [phone]       Fax: [phone]   RxID:   [id number] VOZDGUY 18 MG/3ML SOLN (LIRAGLUTIDE) inject 1.2 mg Subcutaneously once daily  #1 x 0   Entered and Authorized by:   D. Thomos Lemons DO   Signed by:   D. Thomos Lemons DO on 08/07/2010   Method used:    Samples Given   RxID:   [id number]    Orders Added: 1)  T-Basic Metabolic Panel [[id number]] 2)  T- Hemoglobin A1C [83036-23375] 3)  T-Urine Microalbumin w/creat. ratio [[id number]] 4)  Est. Patient Level III [43329]    Current Allergies (reviewed today): No known allergies

## 2010-09-08 ENCOUNTER — Encounter: Payer: Self-pay | Admitting: Internal Medicine

## 2010-09-08 ENCOUNTER — Ambulatory Visit (INDEPENDENT_AMBULATORY_CARE_PROVIDER_SITE_OTHER): Payer: 59 | Admitting: Internal Medicine

## 2010-09-08 DIAGNOSIS — M545 Low back pain, unspecified: Secondary | ICD-10-CM

## 2010-09-08 DIAGNOSIS — E119 Type 2 diabetes mellitus without complications: Secondary | ICD-10-CM

## 2010-09-11 ENCOUNTER — Encounter: Payer: Self-pay | Admitting: Internal Medicine

## 2010-09-13 NOTE — Assessment & Plan Note (Signed)
Summary: 1 MONTH FOLLOW UP/MHF   Vital Signs:  Patient profile:   40 year old female Height:      67 inches Weight:      259 pounds BMI:     40.71 O2 Sat:      100 % on Room air Temp:     98.3 degrees F oral Pulse rate:   91 / minute Resp:     18 per minute BP sitting:   122 / 80  (left arm) Cuff size:   large  Vitals Entered By: Glendell Docker CMA (September 08, 2010 9:30 AM)  O2 Flow:  Room air CC: 1 month Follow , Type 2 diabetes mellitus follow-up Is Patient Diabetic? Yes Did you bring your meter with you today? Yes Pain Assessment Patient in pain? no      Comments c/o left low back discomfort for the past 4 months, Victoza causing her to have low blood sugars with the Metformin when she increased the dose, 7 day avg 172, 14 day 157, 30 day 132,patient states she has switched to electronic cigarette   Primary Care Provider:  DThomos Lemons DO  CC:  1 month Follow  and Type 2 diabetes mellitus follow-up.  History of Present Illness:  Type 2 Diabetes Mellitus Follow-Up      This is a 40 year old woman who presents for Type 2 diabetes mellitus follow-up.  The patient denies self managed hypoglycemia, hypoglycemia requiring help, and weight gain.  Since the last visit the patient reports good dietary compliance and compliance with medications.   one episode of significant nausea in AM after taking victoza.  no abd pain.  no vomiting  occ left low back pain.  can radiate to left leg.  no severe pain.  worse with prolonged standing no lowere ext weakness  Preventive Screening-Counseling & Management  Alcohol-Tobacco     Smoking Status: quit  Allergies (verified): No Known Drug Allergies  Past History:  Past Medical History: Bipolar Alcohol use  History of gestational diabetes    Tobacco abuse    History of cannabis use Borderline DM II Left knee osteoarthritis  Social History: Smoking Status:  quit  Physical Exam  General:  alert, well-developed, and  well-nourished.   Lungs:  normal respiratory effort and normal breath sounds.   Heart:  normal rate, regular rhythm, and no gallop.   Extremities:  No lower extremity edema Neurologic:  cranial nerves II-XII intact and gait normal.     Impression & Recommendations:  Problem # 1:  DIABETES MELLITUS, TYPE II (ICD-250.00)  Pt having intermittent nausea with victoza.  Pt advised to use lower dose .6 mg to reduce GI side effects.  we discussed symptoms of pancreatitis.  pt to report persistent GI symptoms and stop victoza with red flag symptoms  Her updated medication list for this problem includes:    Metformin Hcl 1000 Mg Tabs (Metformin hcl) ..... One by mouth bid    Victoza 18 Mg/52ml Soln (Liraglutide) ..... Inject 1.2 mg subcutaneously once daily  Orders: T- Hemoglobin A1C ([id number]) T-Basic Metabolic Panel ([id number]) T-Urine Microalbumin w/creat. ratio [phone])  Problem # 2:  BACK PAIN, LUMBAR (ICD-724.2) back pain due lumbar spondylosis vs bulging disc occ mild radiation to left leg. reviewed stretching exerises Patient advised to call office if symptoms persist or worsen.  Complete Medication List: 1)  Onetouch Ultra Test Strp (Glucose blood) .... Test daily 2)  Metformin Hcl 1000 Mg Tabs (Metformin hcl) .... One by mouth  bid 3)  Lamictal 150 Mg Tabs (Lamotrigine) .... Take 1 tab by mouth at bedtime 4)  Wellbutrin Xl 300 Mg Xr24h-tab (Bupropion hcl) .... Take 1 tablet by mouth every morning 5)  Depakote 500 Mg Tbec (Divalproex sodium) .... 3  tablets by mouth at bedtime 6)  Victoza 18 Mg/106ml Soln (Liraglutide) .... Inject 1.2 mg subcutaneously once daily 7)  Bd Pen Needle Short U/f 31g X 8 Mm Misc (Insulin pen needle) .... Use once daily as directed  Patient Instructions: 1)  Please schedule a follow-up appointment in 3 months. 2)  BMP prior to visit, ICD-9:  250.00 3)  HbgA1C prior to visit, ICD-9:  250.00 4)  Urine Microalbumin prior to visit, ICD-9:   250.00 5)  Please return for lab work one (1) week before your next appointment.  Prescriptions: METFORMIN HCL 1000 MG TABS (METFORMIN HCL) one by mouth bid  #180 x 1   Entered and Authorized by:   D. Thomos Lemons DO   Signed by:   D. Thomos Lemons DO on 09/08/2010   Method used:   Print then Give to Patient   RxID:   [id number] VICTOZA 18 MG/3ML SOLN (LIRAGLUTIDE) inject 1.2 mg Subcutaneously once daily  #1 month x 3   Entered and Authorized by:   D. Thomos Lemons DO   Signed by:   D. Thomos Lemons DO on 09/08/2010   Method used:   Electronically to        [facility]  [address] [phone][facility] (retail)       [address]       [place], [place]  [address]       Ph: [phone] or [phone]       Fax: [phone]   RxID:   [id number]    Orders Added: 1)  T- Hemoglobin A1C [83036-23375] 2)  T-Basic Metabolic Panel [[id number]] 3)  T-Urine Microalbumin w/creat. ratio [[id number]] 4)  Est. Patient Level III [33825]    Current Allergies (reviewed today): No known allergies

## 2010-09-14 ENCOUNTER — Telehealth: Payer: Self-pay | Admitting: Internal Medicine

## 2010-09-19 NOTE — Medication Information (Signed)
Summary: Victoza Approved  Victoza Approved   Imported By: Maryln Gottron 09/14/2010 15:14:28  _____________________________________________________________________  External Attachment:    Type:   Image     Comment:   External Document

## 2010-09-19 NOTE — Progress Notes (Signed)
Summary: Pen Needles  Phone Note Call from Patient Call back at Home Phone [phone]   Caller: Patient Call For: D. Thomos Lemons DO Summary of Call: patient called and left voice message stating she was given a rx fro Victoza, however she does not have the pen needles to use them. She is [facility] [address] Initial call taken by: Glendell Docker CMA,  September 14, 2010 11:51 AM  Follow-up for Phone Call        call returned to patient she states the [facility] does not accept her insurance. She is requesting [facility] [address]. [facility] [address] per patient request Follow-up by: Glendell Docker CMA,  September 14, 2010 11:56 AM    Prescriptions: BD PEN NEEDLE SHORT U/F 31G X 8 MM MISC (INSULIN PEN NEEDLE) use once daily as directed  #30 x 0   Entered by:   Glendell Docker CMA   Authorized by:   D. Thomos Lemons DO   Signed by:   Glendell Docker CMA on 09/14/2010   Method used:   [facility]ronically [facility]        [facility]  [address] [phone][facility] (retail)       [address]       [place], [place]  [address]       Ph: [phone] or [phone]       Fax: [phone]   RxID:   [id number]

## 2010-12-05 NOTE — Discharge Summary (Signed)
Tanya Wilcox, Tanya Wilcox NO.:  [id number]   MEDICAL RECORD NO.:  [id number]          PATIENT TYPE:  IPS   LOCATION:  0301                          FACILITY:  [facility]   PHYSICIAN:  Jasmine Pang, M.D. DATE OF BIRTH:  Feb 15, 1971   DATE OF ADMISSION:  02/06/2007  DATE OF DISCHARGE:  02/11/2007                               DISCHARGE SUMMARY   IDENTIFYING INFORMATION:  This is a 40 year old single African-American  female who was admitted on a voluntary basis on February 06, 2007.   HISTORY OF PRESENT ILLNESS:  The patient complains of depression and  irritability.  She states she has been wanting to die.  She has been  drinking to help her sleep.  She has been using marijuana to help her  sleep and calm down.  She is out of her medications recently and was  asking her employer for some time off.  She has been sleeping about 3-4  hours at night.  She has been using Tylenol PM to sleep.  She has  decreased appetite.  She has no psychosis.   PAST PSYCHIATRIC HISTORY:  This is the first [facility] admission for the  patient.  At age 51, she overdosed on Advil.  She also is on Celexa 20  mg daily with a recent increase to 40 mg daily.   ALCOHOL/DRUG HISTORY:  The patient has been smoking cigarettes daily.   MEDICAL HISTORY:  She has non-insulin-dependent diabetes mellitus.   MEDICATIONS:  She is currently on Celexa 40 mg daily as indicated above.  She also had recently taken 2 Valiums.   ALLERGIES:  She has no known drug allergies.   PHYSICAL EXAMINATION:  The patient was an overweight female, anxious  without physical or medical problems.   LABORATORY DATA:  CBC was within normal limits.  CMET was within normal  limits.   HOSPITAL COURSE:  Upon admission, the patient was continued on her  metformin 1000 mg p.o. q.d. and Ambien 5 mg p.o. q.h.s. p.r.n., may  repeat x1 in one hour, Ativan 1 mg p.o. q.4h. p.r.n. anxiety.  On February 07, 2007, she was started on Seroquel 50 mg p.o.  b.i.d. and at h.s.  Ativan was discontinued and Librium 25 mg p.o. q.6h. p.r.n. withdrawal  symptoms or anxiety.  On February 08, 2007, she was changed to metformin 500  mg in the morning and 500 mg at dinner.  She preferred the dose this  way.  She was also started on Zoloft 50 mg p.o. q.d.  On February 10, 2007,  she was started on Seroquel 50 mg p.o. now for anxiety and then Seroquel  50 mg p.o. q.6h. p.r.n. agitation.  The patient tolerated her  medications well with no significant side effects.  She was friendly and  cooperative.  She described a lot of financial stressors.  She stated  she had begun to feel suicidal.  She had had one OD in the past.  She  stated she was getting irritable with others at work and not sleeping.  She was trying alcohol and marijuana to  calm herself down.  She recently  felt so bad she could not go to work.  As hospitalization progressed,  mental status began to improve.  She was able to participate  appropriately in unit therapeutic groups and activities.  She still had  some anxiety about going home.  She states she has to deal with finances  and a recent breakup.  Her family visited.  Her mother was very  understanding and supportive.  She continued to have some irritability  and dysphoria but this was improving as hospitalization progressed.  On  February 11, 2007, mental status had improved.  The patient was friendly and  cooperative with good eye contact.  Speech was normal rate and flow.  Psychomotor activity was within normal limits.  Mood euthymic.  Affect  wide range.  There was no suicidal or homicidal ideation.  No thoughts  of self-injurious behavior.  No auditory or visual hallucinations.  No  paranoia or delusions.  Thoughts were logical and goal-directed.  Thought content no predominant theme.  Cognitive was grossly back to  baseline which was within normal limits.  The patient planned to return  to her house in [place] and her mother planned to  take her home.  She  was going to do CD IOP at the [facility].  Sharon  __________ from the CD IOP came up and met her.   DISCHARGE DIAGNOSES:  AXIS I:  Mood disorder not otherwise specified.  Polysubstance dependence.  AXIS II:  None.  AXIS III:  Non-insulin-dependent diabetes mellitus.  AXIS IV:  Severe (problems with primary support group, occupational  problem, economic problem, medical problems, burden of psychiatric  illness).  AXIS V:  GAF upon discharge 50; GAF upon admission 35; GAF highest past  year 65.   ACTIVITY/DIET:  There were no specific activity level or dietary  restrictions.   POST-HOSPITAL CARE PLANS:  The patient will go to the CD IOP at [facility] on February 14, 2007 at 10:30 a.m.  She will also follow up  with her family doctor as advised for medical problems.   DISCHARGE MEDICATIONS:  1. Seroquel 50 mg twice a day and at bedtime.  2. Zoloft 50 mg daily.  3. Metformin 500 mg p.o. b.i.d.      Jasmine Pang, M.D.  Electronically Signed     BHS/MEDQ  D:  03/11/2007  T:  03/11/2007  Job:  [id number]

## 2010-12-07 ENCOUNTER — Ambulatory Visit: Payer: 59 | Admitting: Internal Medicine

## 2010-12-08 NOTE — Assessment & Plan Note (Signed)
[facility]                           PRIMARY CARE OFFICE NOTE   Tanya Wilcox, Tanya Wilcox                        MRN:          [id number]  DATE:07/01/2006                            DOB:          07-Nov-1970    CHIEF COMPLAINT:  New patient to practice.   HISTORY OF PRESENT ILLNESS:  The patient is a 40 year old, African-  American female here to establish primary care.  She has not had a  primary care doctor within her recent past.  She did see an OB/GYN.  She  has a 8-year-old child.  She is known to have gestational diabetes.  The  patient states that she has not had any follow up of blood tests  checking for diabetes in some time.  She did have recent labs before our  visit today which is notable for mild hyperglycemia fasting at 115.  Hemoglobin A1c was not obtained.  She is not at this time having any  overt symptoms of polyuria or polydipsia.   Her main concern today has been some issues with her mood.  She has had  a history of depressed mood, intermittent, for several years.  She has  been on Wellbutrin in the past for smoking cessation.  She states that  this did help somewhat her symptomatology.  Upon further questioning,  she has issues of poor sleep, irritability, and has a history of mildly  impulsive behavior going on spending sprees, no issues with keeping a  job.  She has worked with current employer for the last 8 years.   There is no history of substance abuse; however, she states that in the  past she was a heavier drinker and states that the drinking was mainly  to help her sleep at night.  She has chronic insomnia.   PAST MEDICAL HISTORY SUMMARY:  1. Gestational diabetes.  2. Mood disorder.  3. Status post cholecystectomy, 2003.   CURRENT MEDICATIONS:  None.   ALLERGIES TO MEDICATIONS:  None known.   SOCIAL HISTORY:  The patient is single and works in Clinical biochemist for  [employer] and has a 64-year-old child who  lives with her.   FAMILY HISTORY:  Mother is age 20, has hypertension and high  cholesterol.  Father deceased at age 39.  He had a history of alcohol  abuse but died secondary to a motor vehicle accident.  No family history  of diabetes, colon cancer.   HABITS:  Currently she seldom drinks, 1 drink per week, but does smoke  on a daily basis, approximately 1 pack per day.  She has been doing so  since age 27.  She has tried Wellbutrin in the past without success.   REVIEW OF SYSTEMS:  She denies any fevers or chills.  No HEENT symptoms.  Denies any chest pain or shortness of breath.  Denies any chronic  heartburn. Frequent flatulence unrelated to foods.  No dysuria,  frequency, urgency and all other systems negative.   PHYSICAL EXAM:  VITALS:  Height is 5 feet 7 inches.  Weight is 257  pounds.  Temperature  is 98.3.  Pulse is 76.  BP is 137/90 in the left  arm in a seated position.  GENERAL:  The patient is a pleasant, morbidly obese, 40 year old African-  American female in no apparent distress.  HEENT: Normocephalic, atraumatic.  Pupils are equal and reactive to  light bilaterally.  Extraocular motility was intact.  The patient was  anicteric.  Conjunctivae was within normal limits.  External auditory  canals and tympanic membranes are clear bilaterally.  Hearing was  grossly normal.  Was able to hear finger rub bilaterally.  Oropharyngeal  exam was unremarkable.  NECK:  Supple, thick.  No adenopathy, carotid bruits, or thyromegaly.  CHEST:  Normal respiratory effort.  Chest was clear to auscultation  bilaterally.  No rhonchi, rales, or wheezing.  CARDIOVASCULAR:  Regular rate and rhythm.  No significant murmurs, rubs,  or gallops appreciated.  ABDOMEN:  Soft, protuberant, nontender.  Positive bowel sounds.  No  organomegaly.  MUSCULOSKELETAL:  No clubbing, cyanosis, or edema.  SKIN:  Warm and dry.  NEUROLOGIC:  Cranial nerves II through XII were grossly intact.  She was   nonfocal.   LABORATORY DATA:  Baseline CBC showed H and H of 12.9 and 38.8.  White  blood cells were 6.5,  platelets were 436.  Total cholesterol 171,  triglycerides 96, HDL 34.9, LDL of 117.  Comprehensive metabolic profile  was notable for elevated glucose at 115, creatinine at 0.9, BUN 12.  LFTs were unremarkable.  UA was negative.   IMPRESSION/RECOMMENDATIONS:  1. Abnormal glucose.  2. History of gestational diabetes.  3. Mood disorder, possible bipolar versus generalized anxiety      disorder.  4. Tobacco abuse.  5. Health maintenance.   RECOMMENDATIONS:  I am reluctant to try the patient on a typical agent  due to fear of weight gain.  There is a possibility that her main issues  are with generalized anxiety disorder and she will be started on  citalopram 20 mg once a day, 1/2 tablet to be taken for 7 days to be  upwardly titrated thereafter.  She is to followup with me in 2 weeks  regarding effects of the medication.   In terms of her abnormal glucose, she will obtain a hemoglobin A1c today  but start Metformin 500 mg p.o. b.i.d.  For the first week, she is to  take a 1/2 of a 500 mg tablet.  She was warned of the possible GI side  effects.  Ideally, I would like to have her on at least 850 twice a day.  If she is overtly diabetic, she may be a candidate for Byetta to further  assist in weight loss.   On followup visit, we will discuss possible use of Chantix for smoking  cessation; however, I would like to address her anxiety/depression  issues first.     Barbette Hair. Artist Pais, DO  Electronically Signed    RDY/MedQ  DD: 07/01/2006  DT: 07/01/2006  Job #: [id number]

## 2010-12-08 NOTE — Op Note (Signed)
[facility]  Patient:    MARQUETA, PULLEY Visit Number: [id number] MRN: [id number]          Service Type: SUR Location: [id number] Attending Physician:  Vikki Ports. Dictated by:   Earna Coder, M.D. Proc. Date: 02/04/02 Admit Date:  02/04/2002 Discharge Date: 02/05/2002                             Operative Report  PREOPERATIVE DIAGNOSIS:  Symptomatic cholelithiasis.  POSTOPERATIVE DIAGNOSIS:  Symptomatic cholelithiasis.  OPERATION PERFORMED:  Laparoscopic cholecystectomy.  SURGEON:  Stephenie Acres, M.D.  ASSISTANT:  Rose Phi. Maple Hudson, M.D.  ANESTHESIA:  General.  DESCRIPTION OF PROCEDURE:  The patient was taken to the operating room and placed in supine position.  After adequate anesthesia was induced using endotracheal tube, the abdomen was prepped and draped in normal sterile fashion.  A transverse infraumbilical incision was made, I dissected down to the fascia.  The fascia was opened vertically.  A 0 Vicryl pursestring suture was placed around the fascial defect and the peritoneum was entered.  Hasson trocar was placed in the abdomen and the abdomen insufflated with carbon dioxide.  This was accomplished to 15 mHg.  Under direct visualization, a 10 mm port was placed in a subxiphoid region and two 5 mm ports were placed in the right abdomen.  The gallbladder was identified and retracted cephalad.  A number of filmy adhesions were taken down off the infundibulum and the infundibulum was retracted laterally.  A nice window was created posterior to the cystic duct identifying its junction with the gallbladder.  The liver could be seen behind this window.  The cystic duct was triply clipped and divided.  The cystic artery was identified, dissected free of surrounding structures.  Good window was created posterior to it and it was triply clipped and divided.  The gallbladder was taken off the gallbladder bed using  Bovie electrocautery.  It was removed through the umbilical port.  Adequate hemostasis was assured.  The pneumoperitoneum was released.  All incisions were injected using Marcaine.  The trocars were removed and the infraumbilical fascial defect was closed with the 0 Vicryl pursestring suture.  Skin incisions were closed with subcuticular 4-0 Monocryl.  Steri-Strips and sterile dressings were applied.  The patient tolerated the procedure well and went to PACU in good condition. Dictated by:   Earna Coder, M.D. Attending Physician:  Danna Hefty R. DD:  02/04/02 TD:  02/09/02 Job: [id number]

## 2011-01-11 LAB — HM DIABETES EYE EXAM: HM Diabetic Eye Exam: NORMAL

## 2011-01-12 ENCOUNTER — Encounter: Payer: Self-pay | Admitting: Internal Medicine

## 2011-02-20 ENCOUNTER — Ambulatory Visit (INDEPENDENT_AMBULATORY_CARE_PROVIDER_SITE_OTHER): Payer: 59 | Admitting: Internal Medicine

## 2011-02-20 ENCOUNTER — Encounter: Payer: Self-pay | Admitting: Internal Medicine

## 2011-02-20 DIAGNOSIS — R112 Nausea with vomiting, unspecified: Secondary | ICD-10-CM

## 2011-02-20 DIAGNOSIS — R197 Diarrhea, unspecified: Secondary | ICD-10-CM | POA: Insufficient documentation

## 2011-02-20 MED ORDER — ONDANSETRON 8 MG PO TBDP
8.0000 mg | ORAL_TABLET | Freq: Three times a day (TID) | ORAL | Status: DC | PRN
Start: 2011-02-20 — End: 2011-02-26

## 2011-02-20 NOTE — Assessment & Plan Note (Signed)
Suspect possible gastroenteritis. Begin zofran prn n/v as well as otc imodium for diarrhea. Maintain hydration. Monitor fsbs carefully. Followup if no improvement or worsening.

## 2011-02-20 NOTE — Progress Notes (Signed)
  Subjective:    Patient ID: Tanya Wilcox, female    DOB: [birth year], 40 y.o.   MRN: [id number]  HPI Pt presents to clinic for evaluation of nausea. Notes 2 day h/o nausea without emesis. Has associated mild lower abdominal cramping and has noted recently loose watery stools without blood. No fever or chills but does c/o generalized malaise. No recent sick exposure. No other alleviating or exacerbating factors. No other complaints.  Reviewed pmh, medications and allergies    Review of Systems see hpi     Objective:   Physical Exam  Nursing note and vitals reviewed. Constitutional: She appears well-developed and well-nourished. No distress.  HENT:  Head: Normocephalic and atraumatic.  Right Ear: External ear normal.  Left Ear: External ear normal.  Eyes: Conjunctivae are normal. No scleral icterus.  Neck: Neck supple.  Abdominal: Soft. Bowel sounds are normal. She exhibits no distension and no mass. There is no tenderness. There is no rebound and no guarding.  Neurological: She is alert.  Skin: Skin is warm and dry. She is not diaphoretic.  Psychiatric: She has a normal mood and affect.          Assessment & Plan:   No problem-specific assessment & plan notes found for this encounter.

## 2011-02-26 ENCOUNTER — Encounter: Payer: Self-pay | Admitting: Internal Medicine

## 2011-02-26 ENCOUNTER — Ambulatory Visit (INDEPENDENT_AMBULATORY_CARE_PROVIDER_SITE_OTHER): Payer: 59 | Admitting: Internal Medicine

## 2011-02-26 DIAGNOSIS — E119 Type 2 diabetes mellitus without complications: Secondary | ICD-10-CM

## 2011-02-26 DIAGNOSIS — R519 Headache, unspecified: Secondary | ICD-10-CM | POA: Insufficient documentation

## 2011-02-26 DIAGNOSIS — F319 Bipolar disorder, unspecified: Secondary | ICD-10-CM

## 2011-02-26 DIAGNOSIS — R51 Headache: Secondary | ICD-10-CM

## 2011-02-26 DIAGNOSIS — R609 Edema, unspecified: Secondary | ICD-10-CM

## 2011-02-26 LAB — CBC WITH DIFFERENTIAL/PLATELET
Basophils Absolute: 0 10*3/uL (ref 0.0–0.1)
Eosinophils Absolute: 0.1 10*3/uL (ref 0.0–0.7)
Eosinophils Relative: 2 % (ref 0–5)
Lymphocytes Relative: 40 % (ref 12–46)
Lymphs Abs: 2.6 10*3/uL (ref 0.7–4.0)
MCV: 92.9 fL (ref 78.0–100.0)
Neutrophils Relative %: 51 % (ref 43–77)
Platelets: 433 10*3/uL — ABNORMAL HIGH (ref 150–400)
RBC: 3.8 MIL/uL — ABNORMAL LOW (ref 3.87–5.11)
RDW: 12.6 % (ref 11.5–15.5)
WBC: 6.5 10*3/uL (ref 4.0–10.5)

## 2011-02-26 LAB — BASIC METABOLIC PANEL
CO2: 23 mEq/L (ref 19–32)
Calcium: 8.8 mg/dL (ref 8.4–10.5)
Creat: 0.78 mg/dL (ref 0.50–1.10)
Sodium: 135 mEq/L (ref 135–145)

## 2011-02-26 MED ORDER — FUROSEMIDE 20 MG PO TABS: ORAL_TABLET | ORAL | Status: DC

## 2011-02-26 MED ORDER — LIRAGLUTIDE 18 MG/3ML ~~LOC~~ SOLN: 1.2000 mg | Freq: Every day | SUBCUTANEOUS | Status: DC

## 2011-02-26 MED ORDER — ISOMETHEPTENE-APAP-DICHLORAL 65-325-100 MG PO CAPS: 1.0000 | ORAL_CAPSULE | ORAL | Status: DC | PRN

## 2011-02-26 NOTE — Progress Notes (Signed)
  Subjective:    Patient ID: Tanya Wilcox, female    DOB: 05-Jan-1971, 40 y.o.   MRN: [id number]  HPI Pt presents to clinic for evaluation of multiple medical problems. States stopped all medications duet to ?side effects. FSBS 200-300 without hypoglycemia. C/o mild recent bilateral le edema without dyspnea or calf swelling. Has had 4 days of daily ha's that are mild and waxing/waning. Denies neurologic deficits. No obvious triggers. No exacerbating or alleviating factors. No other complaints.  Reviewed pmh, medications and allergies.    Review of Systems see hpi     Objective:   Physical Exam  Nursing note and vitals reviewed. Constitutional: She appears well-developed and well-nourished. No distress.  HENT:  Head: Normocephalic and atraumatic.  Right Ear: External ear normal.  Left Ear: External ear normal.  Eyes: Conjunctivae and EOM are normal. Pupils are equal, round, and reactive to light. No scleral icterus.  Neck: Neck supple.  Cardiovascular: Normal rate, regular rhythm, normal heart sounds and intact distal pulses.  Exam reveals no gallop and no friction rub.   No murmur heard. Pulmonary/Chest: Effort normal and breath sounds normal. No respiratory distress. She has no wheezes. She has no rales.  Musculoskeletal:       Trace to +1 bilateral le edema. No calf swelling/tenderness or palpable cords  Neurological: She is alert.  Skin: Skin is warm and dry. She is not diaphoretic.  Psychiatric: She has a normal mood and affect.          Assessment & Plan:

## 2011-02-26 NOTE — Assessment & Plan Note (Signed)
Resume victoza and daily fsbs. Obtain cbc, chem7, a1c, urine microalbumin.

## 2011-02-26 NOTE — Assessment & Plan Note (Signed)
Attempt low dose lasix prn.

## 2011-02-26 NOTE — Patient Instructions (Signed)
Please schedule chem7, a1c 250.0 and lipid/lft 272.4 prior to next appt

## 2011-02-26 NOTE — Assessment & Plan Note (Signed)
Attempt midrin prn. followup if no improvement or worsening.

## 2011-02-26 NOTE — Assessment & Plan Note (Signed)
Stable. Off medications. Instructed to notify psychiatry immediately. States understanding and agreement.

## 2011-02-27 LAB — MICROALBUMIN / CREATININE URINE RATIO
Creatinine, Urine: 224.5 mg/dL
Microalb, Ur: 0.56 mg/dL (ref 0.00–1.89)

## 2011-02-27 LAB — HEMOGLOBIN A1C: Mean Plasma Glucose: 171 mg/dL — ABNORMAL HIGH (ref <117)

## 2011-02-28 ENCOUNTER — Other Ambulatory Visit: Payer: Self-pay | Admitting: *Deleted

## 2011-02-28 ENCOUNTER — Telehealth: Payer: Self-pay | Admitting: *Deleted

## 2011-02-28 MED ORDER — LIRAGLUTIDE 18 MG/3ML ~~LOC~~ SOLN: 1.2000 mg | Freq: Every day | SUBCUTANEOUS | Status: DC

## 2011-02-28 NOTE — Telephone Encounter (Signed)
Call placed to patient regarding lab results. She was advised to resume Victoza. Rx refill was sent to pharmacy on 02/26/2011

## 2011-02-28 NOTE — Telephone Encounter (Signed)
Patient called requesting rx for Victoza to [facility]. She states that she is able to fill rx's at the local pharmacy only 3 times.   Rx for Victoza submitted to [facility]

## 2011-04-23 ENCOUNTER — Other Ambulatory Visit: Payer: 59

## 2011-04-30 ENCOUNTER — Encounter: Payer: Self-pay | Admitting: Internal Medicine

## 2011-04-30 ENCOUNTER — Ambulatory Visit (INDEPENDENT_AMBULATORY_CARE_PROVIDER_SITE_OTHER): Payer: 59 | Admitting: Internal Medicine

## 2011-04-30 ENCOUNTER — Telehealth: Payer: Self-pay | Admitting: Internal Medicine

## 2011-04-30 DIAGNOSIS — E785 Hyperlipidemia, unspecified: Secondary | ICD-10-CM

## 2011-04-30 DIAGNOSIS — E119 Type 2 diabetes mellitus without complications: Secondary | ICD-10-CM

## 2011-04-30 DIAGNOSIS — F319 Bipolar disorder, unspecified: Secondary | ICD-10-CM

## 2011-04-30 MED ORDER — LIRAGLUTIDE 18 MG/3ML ~~LOC~~ SOLN: 1.8000 mg | Freq: Every day | SUBCUTANEOUS | Status: DC

## 2011-04-30 NOTE — Progress Notes (Signed)
  Subjective:    Patient ID: Tanya Wilcox, female    DOB: 1971/03/02, 40 y.o.   MRN: [id number]  HPI Pt presents to clinic for followup of multiple medical problems. BP reviewed nl. States typical fsbs 150-160 without hypoglycemia. Has resumed and tolerating victoza. Has lost ~11 lbs since last visit. Is off bipolar medications and canceled psychiatry followup due to feeling good. Agree to contact them for followup though. States will obtain flu vaccine through employer. No other complaints.   Past Medical History  Diagnosis Date  . Bipolar disorder   . Alcohol use   . Tobacco abuse   . Gestational diabetes     borderline II  . Osteoarthritis     knee   Past Surgical History  Procedure Date  . Cholecystectomy 2003    reports that she has been smoking Cigarettes.  She has been smoking about .25 packs per day. She does not have any smokeless tobacco history on file. She reports that she drinks alcohol. Her drug history not on file. family history includes Alcohol abuse in her father; Hyperlipidemia in her mother; and Hypertension in her mother. No Known Allergies   Review of Systems see hpi    Objective:   Physical Exam  Physical Exam  Nursing note and vitals reviewed. Constitutional: Appears well-developed and well-nourished. No distress.  HENT:  Head: Normocephalic and atraumatic.  Right Ear: External ear normal.  Left Ear: External ear normal.  Eyes: Conjunctivae are normal. No scleral icterus.  Neck: Neck supple. Carotid bruit is not present.  Cardiovascular: Normal rate, regular rhythm and normal heart sounds.  Exam reveals no gallop and no friction rub.   No murmur heard. Pulmonary/Chest: Effort normal and breath sounds normal. No respiratory distress. He has no wheezes. no rales.  Lymphadenopathy:    He has no cervical adenopathy.  Neurological:Alert.  Skin: Skin is warm and dry. Not diaphoretic.  Psychiatric: Has a normal mood and affect.        Assessment &  Plan:

## 2011-04-30 NOTE — Assessment & Plan Note (Signed)
Obtain lipid/lft. 

## 2011-04-30 NOTE — Assessment & Plan Note (Signed)
asx currently off medications. Recommend followup with psychiatry and agrees.

## 2011-04-30 NOTE — Telephone Encounter (Signed)
Patient called with fax for [facility]. Fax [phone]

## 2011-04-30 NOTE — Assessment & Plan Note (Signed)
suboptimal control. Increase victoza dose. Obtain chem7 and a1c

## 2011-04-30 NOTE — Patient Instructions (Signed)
Please schedule chem7, a1c (250.0) and lipid/lft (272.4) prior to next visit 

## 2011-04-30 NOTE — Telephone Encounter (Signed)
Lab orders entered for December. Reminder flag sent to fax labs to [facility] for December 2012.

## 2011-05-02 ENCOUNTER — Telehealth: Payer: Self-pay | Admitting: Internal Medicine

## 2011-05-02 MED ORDER — GLUCOSE BLOOD VI STRP: 1.0000 | ORAL_STRIP | Freq: Three times a day (TID) | Status: DC

## 2011-05-02 NOTE — Telephone Encounter (Signed)
Rx refill sent to parmacy.

## 2011-05-02 NOTE — Telephone Encounter (Signed)
New Rx  One touch ultra test strips. 90 day supply. Refills 4

## 2011-05-04 LAB — URINE DRUGS OF ABUSE SCREEN W ALC, ROUTINE (REF LAB)
Benzodiazepines.: NEGATIVE
Cocaine Metabolites: NEGATIVE
Cocaine Metabolites: NEGATIVE
Creatinine,U: 162
Creatinine,U: 93
Ethyl Alcohol: <5
Ethyl Alcohol: <5
Phencyclidine (PCP): NEGATIVE
Phencyclidine (PCP): NEGATIVE

## 2011-05-04 LAB — PROPOXYPHENE, CONFIRMATION
Propoxyphene Metabolite: 2000 ng/mL
Propoxyphene or Metab. GC/MS: NEGATIVE

## 2011-05-07 LAB — URINE DRUGS OF ABUSE SCREEN W ALC, ROUTINE (REF LAB)
Barbiturate Quant, Ur: NEGATIVE
Benzodiazepines.: NEGATIVE
Benzodiazepines.: NEGATIVE
Cocaine Metabolites: NEGATIVE
Creatinine,U: 122.3
Ethyl Alcohol: <5
Ethyl Alcohol: <10
Marijuana Metabolite: NEGATIVE
Methadone: NEGATIVE
Phencyclidine (PCP): NEGATIVE
Phencyclidine (PCP): NEGATIVE
Propoxyphene: NEGATIVE

## 2011-05-07 LAB — COMPREHENSIVE METABOLIC PANEL
ALT: 15
AST: 15
Alkaline Phosphatase: 44
CO2: 26
Calcium: 8.9
GFR calc Af Amer: >60
Glucose, Bld: 102 — ABNORMAL HIGH
Potassium: 4.3
Sodium: 141
Total Protein: 6.4

## 2011-05-07 LAB — DRUGS OF ABUSE SCREEN W/O ALC, ROUTINE URINE
Cocaine Metabolites: NEGATIVE
Creatinine,U: 98.6
Methadone: NEGATIVE
Opiate Screen, Urine: NEGATIVE
Phencyclidine (PCP): NEGATIVE

## 2011-05-07 LAB — URINE MICROSCOPIC-ADD ON

## 2011-05-07 LAB — URINALYSIS, ROUTINE W REFLEX MICROSCOPIC
Bilirubin Urine: NEGATIVE
Glucose, UA: NEGATIVE
Ketones, ur: NEGATIVE
Specific Gravity, Urine: 1.017
pH: 7.5

## 2011-05-07 LAB — CBC
Hemoglobin: 12.9
RBC: 3.91
RDW: 12.1

## 2011-05-07 LAB — MAGNESIUM: Magnesium: 2.1

## 2011-07-03 ENCOUNTER — Encounter: Payer: Self-pay | Admitting: Internal Medicine

## 2011-07-03 ENCOUNTER — Ambulatory Visit (INDEPENDENT_AMBULATORY_CARE_PROVIDER_SITE_OTHER): Payer: 59 | Admitting: Internal Medicine

## 2011-07-03 VITALS — BP 112/82 | HR 91 | Temp 98.1°F | Resp 18

## 2011-07-03 DIAGNOSIS — F419 Anxiety disorder, unspecified: Secondary | ICD-10-CM

## 2011-07-03 DIAGNOSIS — F411 Generalized anxiety disorder: Secondary | ICD-10-CM

## 2011-07-03 MED ORDER — BUPROPION HCL ER (XL) 300 MG PO TB24: 300.0000 mg | ORAL_TABLET | ORAL | Status: DC

## 2011-07-03 MED ORDER — ALPRAZOLAM 0.25 MG PO TABS: 0.2500 mg | ORAL_TABLET | Freq: Three times a day (TID) | ORAL | Status: AC | PRN

## 2011-07-03 NOTE — Progress Notes (Signed)
  Subjective:    Patient ID: Tanya Wilcox, female    DOB: 27-Feb-1971, 40 y.o.   MRN: [id number]  HPI Pt presents to clinic for evaluation of anxiety. Notes recent increase in anxiety without obvious secondary stressors. Mind is racing. Denies depressive sx's but wishes to avoid that occuring. Has h/o bipolar previously followed by psychiatry. Stopped medications and seeing psychiatrist. Did not like having to take medications. Previously took depakote and lamictal with wellbutrin being added last. Never saw therapist and interested in pursuing this. Denies si/hi. No alleviating or exacerbating factors. No other complaints.  Past Medical History  Diagnosis Date  . Bipolar disorder   . Alcohol use   . Tobacco abuse   . Gestational diabetes     borderline II  . Osteoarthritis     knee   Past Surgical History  Procedure Date  . Cholecystectomy 2003    reports that she has been smoking Cigarettes.  She has been smoking about .25 packs per day. She does not have any smokeless tobacco history on file. She reports that she drinks alcohol. Her drug history not on file. family history includes Alcohol abuse in her father; Hyperlipidemia in her mother; and Hypertension in her mother. No Known Allergies   Review of Systems see hpi     Objective:   Physical Exam  Nursing note and vitals reviewed. Constitutional: She appears well-developed and well-nourished. No distress.  HENT:  Head: Normocephalic and atraumatic.  Neurological: She is alert.  Skin: Skin is warm and dry. She is not diaphoretic.  Psychiatric: Judgment and thought content normal. Her mood appears anxious. Her affect is not angry, not blunt, not labile and not inappropriate. Her speech is not rapid and/or pressured, not delayed, not tangential and not slurred. She is actively hallucinating. She is not agitated, not aggressive, is not hyperactive, not slowed, not withdrawn and not combative. Cognition and memory are normal. She  does not exhibit a depressed mood. She is communicative. She is attentive.          Assessment & Plan:

## 2011-07-03 NOTE — Assessment & Plan Note (Signed)
Resume wellbutrin xl 300mg  po qd. Pt reluctant to pursue multiple daily medications. Attempt short term sparing use xanax. Aware of potential for sedation, addiction and/or tolerance. Refer to psychiatry and therapist.

## 2011-07-24 LAB — CBC AND DIFFERENTIAL: Hemoglobin: 14.4 g/dL (ref 12.0–16.0)

## 2011-07-25 ENCOUNTER — Ambulatory Visit: Payer: 59 | Admitting: Internal Medicine

## 2011-07-26 ENCOUNTER — Ambulatory Visit (INDEPENDENT_AMBULATORY_CARE_PROVIDER_SITE_OTHER): Payer: 59 | Admitting: Internal Medicine

## 2011-07-26 ENCOUNTER — Encounter: Payer: Self-pay | Admitting: Internal Medicine

## 2011-07-26 ENCOUNTER — Ambulatory Visit (HOSPITAL_BASED_OUTPATIENT_CLINIC_OR_DEPARTMENT_OTHER)
Admission: RE | Admit: 2011-07-26 | Discharge: 2011-07-26 | Disposition: A | Discharge status: Home / Self Care | Payer: 59 | Source: Ambulatory Visit | Attending: Internal Medicine | Admitting: Internal Medicine

## 2011-07-26 ENCOUNTER — Telehealth: Payer: Self-pay | Admitting: Internal Medicine

## 2011-07-26 DIAGNOSIS — M545 Low back pain, unspecified: Secondary | ICD-10-CM

## 2011-07-26 DIAGNOSIS — F319 Bipolar disorder, unspecified: Secondary | ICD-10-CM

## 2011-07-26 DIAGNOSIS — M79609 Pain in unspecified limb: Secondary | ICD-10-CM

## 2011-07-26 DIAGNOSIS — M549 Dorsalgia, unspecified: Secondary | ICD-10-CM

## 2011-07-26 DIAGNOSIS — E119 Type 2 diabetes mellitus without complications: Secondary | ICD-10-CM

## 2011-07-26 DIAGNOSIS — F411 Generalized anxiety disorder: Secondary | ICD-10-CM

## 2011-07-26 DIAGNOSIS — E785 Hyperlipidemia, unspecified: Secondary | ICD-10-CM

## 2011-07-26 DIAGNOSIS — F419 Anxiety disorder, unspecified: Secondary | ICD-10-CM

## 2011-07-26 DIAGNOSIS — M5380 Other specified dorsopathies, site unspecified: Secondary | ICD-10-CM

## 2011-07-26 LAB — BASIC METABOLIC PANEL
CO2: 24 mEq/L (ref 19–32)
Calcium: 9.5 mg/dL (ref 8.4–10.5)
Chloride: 105 mEq/L (ref 96–112)
Glucose, Bld: 145 mg/dL — ABNORMAL HIGH (ref 70–99)
Potassium: 4.5 mEq/L (ref 3.5–5.3)
Sodium: 138 mEq/L (ref 135–145)

## 2011-07-26 LAB — HEPATIC FUNCTION PANEL
ALT: 21 U/L (ref 0–35)
AST: 17 U/L (ref 0–37)
Albumin: 4.1 g/dL (ref 3.5–5.2)
Alkaline Phosphatase: 49 U/L (ref 39–117)
Indirect Bilirubin: 0.2 mg/dL (ref 0.0–0.9)
Total Protein: 7 g/dL (ref 6.0–8.3)

## 2011-07-26 LAB — LIPID PANEL
HDL: 42 mg/dL (ref >39)
LDL Cholesterol: 120 mg/dL — ABNORMAL HIGH (ref 0–99)

## 2011-07-26 MED ORDER — DICLOFENAC SODIUM 75 MG PO TBEC: DELAYED_RELEASE_TABLET | ORAL | Status: DC

## 2011-07-26 NOTE — Telephone Encounter (Signed)
Patient has follow up appt on 10/24/11. Please schedule chem7, cbc, a1c (250.0) prior to next visit. Patient will be going to [facility] lab.

## 2011-07-26 NOTE — Assessment & Plan Note (Signed)
Improved. reattempt psychiatrist and therapist referral.

## 2011-07-26 NOTE — Telephone Encounter (Signed)
Lab orders entered for March 2013. 

## 2011-07-26 NOTE — Progress Notes (Signed)
  Subjective:    Patient ID: Tanya Wilcox, female    DOB: 10-13-1970, 41 y.o.   MRN: [id number]  HPI Pt presents to clinic for followup of multiple medical problems. H/o bipolar/anxiety and is improved taking welbutrin. Did not hear from psychiatrist or therapist office for appt. States is not checking fsbs. BP reviewed normotensive. Notes chronic intermittent left lbp radiating down left leg to above the knee. Duration is several months. No paresthesias, injury or weakness. Taking no medication for the problem. No alleviating of exacerbating factors. No other complaints.  Past Medical History  Diagnosis Date  . Bipolar disorder   . Alcohol use   . Tobacco abuse   . Gestational diabetes     borderline II  . Osteoarthritis     knee   Past Surgical History  Procedure Date  . Cholecystectomy 2003    reports that she has been smoking Cigarettes.  She has been smoking about .25 packs per day. She has never used smokeless tobacco. She reports that she drinks alcohol. Her drug history not on file. family history includes Alcohol abuse in her father; Hyperlipidemia in her mother; and Hypertension in her mother. No Known Allergies    Review of Systems see hpi     Objective:   Physical Exam  Nursing note and vitals reviewed. Constitutional: She appears well-developed and well-nourished. No distress.  HENT:  Head: Normocephalic and atraumatic.  Right Ear: External ear normal.  Left Ear: External ear normal.  Eyes: Conjunctivae are normal. No scleral icterus.  Neck: Neck supple.  Cardiovascular: Normal rate, regular rhythm and normal heart sounds.   Pulmonary/Chest: Effort normal.  Musculoskeletal:       LLE strength 5/5. Gait nl  Neurological: She is alert.  Skin: Skin is warm and dry. She is not diaphoretic.  Psychiatric: She has a normal mood and affect. Her behavior is normal.          Assessment & Plan:

## 2011-07-26 NOTE — Patient Instructions (Signed)
Please schedule chem7, cbc, a1c 250.0 prior to next visit 

## 2011-07-26 NOTE — Assessment & Plan Note (Signed)
Obtain lipid/lft. 

## 2011-07-26 NOTE — Assessment & Plan Note (Signed)
Obtain ls plain radiograph. Attempt voltaren prn with food and no other nsaids. Followup if no improvement or worsening.

## 2011-07-26 NOTE — Assessment & Plan Note (Signed)
Unknown control. Resume fsbs. Obtain chem7 and a1c

## 2011-08-03 ENCOUNTER — Other Ambulatory Visit: Payer: Self-pay | Admitting: Internal Medicine

## 2011-08-03 DIAGNOSIS — E785 Hyperlipidemia, unspecified: Secondary | ICD-10-CM

## 2011-08-14 ENCOUNTER — Ambulatory Visit (INDEPENDENT_AMBULATORY_CARE_PROVIDER_SITE_OTHER): Payer: 59 | Admitting: Professional

## 2011-08-14 DIAGNOSIS — F411 Generalized anxiety disorder: Secondary | ICD-10-CM

## 2011-08-21 ENCOUNTER — Ambulatory Visit (INDEPENDENT_AMBULATORY_CARE_PROVIDER_SITE_OTHER): Payer: 59 | Admitting: Professional

## 2011-08-21 DIAGNOSIS — F411 Generalized anxiety disorder: Secondary | ICD-10-CM

## 2011-08-23 ENCOUNTER — Ambulatory Visit: Payer: 59 | Admitting: Professional

## 2011-08-28 ENCOUNTER — Ambulatory Visit (INDEPENDENT_AMBULATORY_CARE_PROVIDER_SITE_OTHER): Payer: 59 | Admitting: Professional

## 2011-08-28 DIAGNOSIS — F411 Generalized anxiety disorder: Secondary | ICD-10-CM

## 2011-09-11 ENCOUNTER — Ambulatory Visit: Payer: 59 | Admitting: Professional

## 2011-09-25 ENCOUNTER — Ambulatory Visit (INDEPENDENT_AMBULATORY_CARE_PROVIDER_SITE_OTHER): Payer: 59 | Admitting: Professional

## 2011-09-25 DIAGNOSIS — F411 Generalized anxiety disorder: Secondary | ICD-10-CM

## 2011-10-09 ENCOUNTER — Ambulatory Visit (INDEPENDENT_AMBULATORY_CARE_PROVIDER_SITE_OTHER): Payer: 59 | Admitting: Professional

## 2011-10-09 DIAGNOSIS — F411 Generalized anxiety disorder: Secondary | ICD-10-CM

## 2011-10-24 ENCOUNTER — Encounter: Payer: Self-pay | Admitting: Internal Medicine

## 2011-10-24 ENCOUNTER — Ambulatory Visit (INDEPENDENT_AMBULATORY_CARE_PROVIDER_SITE_OTHER): Payer: 59 | Admitting: Internal Medicine

## 2011-10-24 ENCOUNTER — Other Ambulatory Visit: Payer: Self-pay | Admitting: *Deleted

## 2011-10-24 VITALS — BP 122/80 | HR 70 | Temp 98.0°F | Resp 18 | Ht 71.0 in | Wt 251.0 lb

## 2011-10-24 DIAGNOSIS — M545 Low back pain, unspecified: Secondary | ICD-10-CM

## 2011-10-24 DIAGNOSIS — E119 Type 2 diabetes mellitus without complications: Secondary | ICD-10-CM

## 2011-10-24 DIAGNOSIS — E785 Hyperlipidemia, unspecified: Secondary | ICD-10-CM

## 2011-10-24 LAB — CBC
Hemoglobin: 14.4 g/dL (ref 12.0–15.0)
MCH: 33.3 pg (ref 26.0–34.0)
MCV: 97.7 fL (ref 78.0–100.0)
Platelets: 439 10*3/uL — ABNORMAL HIGH (ref 150–400)
RBC: 4.33 MIL/uL (ref 3.87–5.11)
WBC: 6.9 10*3/uL (ref 4.0–10.5)

## 2011-10-24 LAB — BASIC METABOLIC PANEL
CO2: 25 mEq/L (ref 19–32)
Chloride: 101 mEq/L (ref 96–112)
Creat: 0.88 mg/dL (ref 0.50–1.10)
Glucose, Bld: 263 mg/dL — ABNORMAL HIGH (ref 70–99)

## 2011-10-24 LAB — LIPID PANEL
Cholesterol: 221 mg/dL — ABNORMAL HIGH (ref 0–200)
LDL Cholesterol: 136 mg/dL — ABNORMAL HIGH (ref 0–99)
Total CHOL/HDL Ratio: 5.8 Ratio
Triglycerides: 235 mg/dL — ABNORMAL HIGH (ref <150)
VLDL: 47 mg/dL — ABNORMAL HIGH (ref 0–40)

## 2011-10-24 NOTE — Patient Instructions (Signed)
Please schedule labs prior to next visit Chem7, a1c-250.0 

## 2011-10-24 NOTE — Assessment & Plan Note (Signed)
Obtain lipid 

## 2011-10-24 NOTE — Assessment & Plan Note (Signed)
resolved 

## 2011-10-24 NOTE — Progress Notes (Signed)
  Subjective:    Patient ID: Tanya Wilcox, female    DOB: [birth year], 41 y.o.   MRN: [id number]  HPI Pt presents to clinic for followup of multiple medical problems. Mood improved. Now seeing therapist and psychiatrist. Continues to use tobacco one ppd but not ready to quit. Back pain resolved and only had to take voltaren twice. Reports fsbs 260-300. Taking victoza 3x/wk. When takes victoza fsbs doesn't come down.   Past Medical History  Diagnosis Date  . Bipolar disorder   . Alcohol use   . Tobacco abuse   . Gestational diabetes     borderline II  . Osteoarthritis     knee   Past Surgical History  Procedure Date  . Cholecystectomy 2003    reports that she has been smoking Cigarettes.  She has been smoking about .25 packs per day. She has never used smokeless tobacco. She reports that she drinks alcohol. Her drug history not on file. family history includes Alcohol abuse in her father; Hyperlipidemia in her mother; and Hypertension in her mother. No Known Allergies    Review of Systems see hpi     Objective:   Physical Exam  Physical Exam  Nursing note and vitals reviewed. Constitutional: Appears well-developed and well-nourished. No distress.  HENT:  Head: Normocephalic and atraumatic.  Right Ear: External ear normal.  Left Ear: External ear normal.  Eyes: Conjunctivae are normal. No scleral icterus.  Neck: Neck supple. Carotid bruit is not present.  Cardiovascular: Normal rate, regular rhythm and normal heart sounds.  Exam reveals no gallop and no friction rub.   No murmur heard. Pulmonary/Chest: Effort normal and breath sounds normal. No respiratory distress. He has no wheezes. no rales.  Lymphadenopathy:    He has no cervical adenopathy.  Neurological:Alert.  Skin: Skin is warm and dry. Not diaphoretic.  Psychiatric: Has a normal mood and affect.  Diabetic foot exam: +2 DP pulses, no diabetic wounds, ulcerations or significant callousing. Monofilament exam nl.        Assessment & Plan:

## 2011-10-24 NOTE — Assessment & Plan Note (Signed)
Loss of control complicated by noncompliance. Obtain cbc, chem7, a1c and lipid. Consider change of victoza pending results.

## 2011-10-29 ENCOUNTER — Telehealth: Payer: Self-pay | Admitting: *Deleted

## 2011-10-29 DIAGNOSIS — E785 Hyperlipidemia, unspecified: Secondary | ICD-10-CM

## 2011-10-29 MED ORDER — ATORVASTATIN CALCIUM 20 MG PO TABS: 20.0000 mg | ORAL_TABLET | Freq: Every day | ORAL | Status: DC

## 2011-10-29 MED ORDER — SITAGLIPTIN PHOSPHATE 100 MG PO TABS: 100.0000 mg | ORAL_TABLET | Freq: Every day | ORAL | Status: DC

## 2011-10-29 NOTE — Telephone Encounter (Signed)
Patient returned phone call , she was informed per Dr Rodena Medin instructions and has verbalized understanding. Lab work entered for May 2013.

## 2011-10-29 NOTE — Telephone Encounter (Signed)
Message copied by Glendell Docker on Mon Oct 29, 2011  8:48 AM ------      Message from: Staci Righter.      Created: Sun Oct 28, 2011  1:05 PM       Blood sugar definitely rising. Stop victoza. Begin januvia 100mg  qd. Call fsbs in 2wks.       Chol too high. rec lipitor 20mg  qd if no past intolerances. Will needs lipid/lft 272.4 in 4 wks and appt after that. Bring fsbs log

## 2011-10-29 NOTE — Telephone Encounter (Signed)
Call placed to patient. A detailed voice message was left informing patient. Patient advised to return call to acknowledge receipt of phone message.

## 2011-10-30 ENCOUNTER — Ambulatory Visit (INDEPENDENT_AMBULATORY_CARE_PROVIDER_SITE_OTHER): Payer: 59 | Admitting: Professional

## 2011-10-30 DIAGNOSIS — F411 Generalized anxiety disorder: Secondary | ICD-10-CM

## 2011-11-13 ENCOUNTER — Ambulatory Visit: Payer: 59 | Admitting: Professional

## 2011-11-26 ENCOUNTER — Other Ambulatory Visit: Payer: Self-pay | Admitting: *Deleted

## 2011-11-26 DIAGNOSIS — E785 Hyperlipidemia, unspecified: Secondary | ICD-10-CM

## 2011-11-26 LAB — HEPATIC FUNCTION PANEL
AST: 18 U/L (ref 0–37)
Alkaline Phosphatase: 59 U/L (ref 39–117)
Indirect Bilirubin: 0.1 mg/dL (ref 0.0–0.9)
Total Bilirubin: 0.2 mg/dL — ABNORMAL LOW (ref 0.3–1.2)

## 2011-11-26 LAB — LIPID PANEL
LDL Cholesterol: 93 mg/dL (ref 0–99)
Triglycerides: 163 mg/dL — ABNORMAL HIGH (ref <150)

## 2012-01-08 ENCOUNTER — Other Ambulatory Visit: Payer: Self-pay | Admitting: Internal Medicine

## 2012-01-08 NOTE — Telephone Encounter (Signed)
Rx refill sent to pharmacy. 

## 2012-01-08 NOTE — Telephone Encounter (Signed)
60

## 2012-01-21 ENCOUNTER — Encounter: Payer: Self-pay | Admitting: Internal Medicine

## 2012-01-21 ENCOUNTER — Telehealth: Payer: Self-pay | Admitting: Internal Medicine

## 2012-01-21 ENCOUNTER — Ambulatory Visit (INDEPENDENT_AMBULATORY_CARE_PROVIDER_SITE_OTHER): Payer: 59 | Admitting: Internal Medicine

## 2012-01-21 VITALS — BP 118/78 | HR 72 | Temp 98.4°F | Resp 16 | Wt 255.0 lb

## 2012-01-21 DIAGNOSIS — E785 Hyperlipidemia, unspecified: Secondary | ICD-10-CM

## 2012-01-21 DIAGNOSIS — F411 Generalized anxiety disorder: Secondary | ICD-10-CM

## 2012-01-21 DIAGNOSIS — E119 Type 2 diabetes mellitus without complications: Secondary | ICD-10-CM

## 2012-01-21 DIAGNOSIS — F419 Anxiety disorder, unspecified: Secondary | ICD-10-CM

## 2012-01-21 LAB — BASIC METABOLIC PANEL
BUN: 14 mg/dL (ref 6–23)
Calcium: 9.6 mg/dL (ref 8.4–10.5)
Chloride: 105 mEq/L (ref 96–112)
Creat: 0.86 mg/dL (ref 0.50–1.10)

## 2012-01-21 LAB — HEMOGLOBIN A1C
Hgb A1c MFr Bld: 8.2 % — ABNORMAL HIGH (ref <5.7)
Mean Plasma Glucose: 189 mg/dL — ABNORMAL HIGH (ref <117)

## 2012-01-21 MED ORDER — SITAGLIPTIN PHOSPHATE 100 MG PO TABS: 100.0000 mg | ORAL_TABLET | Freq: Every day | ORAL | Status: DC

## 2012-01-21 MED ORDER — ATORVASTATIN CALCIUM 20 MG PO TABS: 20.0000 mg | ORAL_TABLET | Freq: Every day | ORAL | Status: DC

## 2012-01-21 MED ORDER — METFORMIN HCL 500 MG PO TABS: 500.0000 mg | ORAL_TABLET | Freq: Two times a day (BID) | ORAL | Status: DC

## 2012-01-21 NOTE — Assessment & Plan Note (Signed)
Discussed and recommended PT. States will consider.

## 2012-01-21 NOTE — Assessment & Plan Note (Signed)
Improving control. Add metformin 500mg  bid. Obtain chem7 and a1c

## 2012-01-21 NOTE — Telephone Encounter (Signed)
Please schedule fasting labs prior to next appointment  Chem7, a1c, urine microalbumin-250.00 and lipid/lft 272.4   Patient has upcoming appointment on 04/23/12. Patient states that she would like to go to [facility] on [address] for her labs.

## 2012-01-21 NOTE — Assessment & Plan Note (Signed)
Improved. ldl at goal. Continue lipitor.

## 2012-01-21 NOTE — Telephone Encounter (Signed)
Done/SLS 

## 2012-01-21 NOTE — Patient Instructions (Signed)
Please schedule fasting labs prior to next appointment Chem7, a1c, urine microalbumin-250.00 and lipid/lft 272.4

## 2012-01-21 NOTE — Progress Notes (Signed)
  Subjective:    Patient ID: Tanya Wilcox, female    DOB: 1970/12/08, 40 y.o.   MRN: [id number]  HPI Pt presents to clinic for followup of multiple medical problems. Tolerating januvia and fsbs improved from 300's to upper 100's. No hypoglycemia. Tolerating statin tx and ld improved to 93. Continues with chronic intermittent lbp- sometimes radiates down left upper leg but denies leg weakness. Also notes chronic intermittent left knee pain. Attempting to increase activity and exercise.   Past Medical History  Diagnosis Date  . Bipolar disorder   . Alcohol use   . Tobacco abuse   . Gestational diabetes     borderline II  . Osteoarthritis     knee   Past Surgical History  Procedure Date  . Cholecystectomy 2003    reports that she has been smoking Cigarettes.  She has been smoking about .25 packs per day. She has never used smokeless tobacco. She reports that she drinks alcohol. Her drug history not on file. family history includes Alcohol abuse in her father; Hyperlipidemia in her mother; and Hypertension in her mother. No Known Allergies    Review of Systems see hpi     Objective:   Physical Exam  Physical Exam  Nursing note and vitals reviewed. Constitutional: Appears well-developed and well-nourished. No distress.  HENT:  Head: Normocephalic and atraumatic.  Right Ear: External ear normal.  Left Ear: External ear normal.  Eyes: Conjunctivae are normal. No scleral icterus.  Neck: Neck supple. Carotid bruit is not present.  Cardiovascular: Normal rate, regular rhythm and normal heart sounds.  Exam reveals no gallop and no friction rub.   No murmur heard. Pulmonary/Chest: Effort normal and breath sounds normal. No respiratory distress. He has no wheezes. no rales.  Lymphadenopathy:    He has no cervical adenopathy.  Neurological:Alert.  Skin: Skin is warm and dry. Not diaphoretic.  Psychiatric: Has a normal mood and affect.  MSK: LLE 5/5 strength. Gait nl       Assessment & Plan:

## 2012-04-23 ENCOUNTER — Ambulatory Visit: Payer: 59 | Admitting: Internal Medicine

## 2012-05-01 ENCOUNTER — Ambulatory Visit: Payer: 59 | Admitting: Internal Medicine

## 2012-09-15 ENCOUNTER — Encounter: Payer: Self-pay | Admitting: Family

## 2012-09-15 ENCOUNTER — Ambulatory Visit (INDEPENDENT_AMBULATORY_CARE_PROVIDER_SITE_OTHER): Payer: 59 | Admitting: Family

## 2012-09-15 ENCOUNTER — Telehealth: Payer: Self-pay | Admitting: Family

## 2012-09-15 VITALS — BP 130/90 | HR 78 | Temp 97.8°F | Resp 16 | Ht 71.0 in | Wt 252.0 lb

## 2012-09-15 DIAGNOSIS — A6000 Herpesviral infection of urogenital system, unspecified: Secondary | ICD-10-CM

## 2012-09-15 DIAGNOSIS — F319 Bipolar disorder, unspecified: Secondary | ICD-10-CM

## 2012-09-15 DIAGNOSIS — E785 Hyperlipidemia, unspecified: Secondary | ICD-10-CM

## 2012-09-15 DIAGNOSIS — IMO0002 Reserved for concepts with insufficient information to code with codable children: Secondary | ICD-10-CM

## 2012-09-15 DIAGNOSIS — E119 Type 2 diabetes mellitus without complications: Secondary | ICD-10-CM

## 2012-09-15 DIAGNOSIS — M545 Low back pain, unspecified: Secondary | ICD-10-CM

## 2012-09-15 LAB — LIPID PANEL
HDL: 34 mg/dL — ABNORMAL LOW (ref >39)
LDL Cholesterol: 135 mg/dL — ABNORMAL HIGH (ref 0–99)
Total CHOL/HDL Ratio: 5.6 Ratio
Triglycerides: 115 mg/dL (ref <150)

## 2012-09-15 LAB — BASIC METABOLIC PANEL WITH GFR
CO2: 24 mEq/L (ref 19–32)
Calcium: 9.7 mg/dL (ref 8.4–10.5)
Chloride: 101 mEq/L (ref 96–112)
Creat: 0.76 mg/dL (ref 0.50–1.10)
GFR, Est Non African American: >89 mL/min
Sodium: 135 mEq/L (ref 135–145)

## 2012-09-15 LAB — HEPATIC FUNCTION PANEL
Albumin: 4.2 g/dL (ref 3.5–5.2)
Total Bilirubin: 0.3 mg/dL (ref 0.3–1.2)
Total Protein: 7.1 g/dL (ref 6.0–8.3)

## 2012-09-15 MED ORDER — VALACYCLOVIR HCL 500 MG PO TABS: 500.0000 mg | ORAL_TABLET | Freq: Two times a day (BID) | ORAL | Status: DC

## 2012-09-15 MED ORDER — VALACYCLOVIR HCL 500 MG PO TABS: 500.0000 mg | ORAL_TABLET | Freq: Every day | ORAL | Status: DC

## 2012-09-15 MED ORDER — DICLOFENAC SODIUM 75 MG PO TBEC: DELAYED_RELEASE_TABLET | ORAL | Status: DC

## 2012-09-15 NOTE — Progress Notes (Signed)
Subjective:    Patient ID: Tanya Wilcox, female    DOB: [birth year], 42 y.o.   MRN: [id number]  HPI  Tanya Wilcox is a 42 yr old female who presents today for follow up.  1) Depression/anxiety- She sees psychiatry- Woodroe Chen PA-c with [facility]) Feels that this is well controlled.  Xanax 0.25 uses some days  2) DM2-  Currently maintained on Januvia and metformin. Reports sugars >200 fasting, not compliant with meds.  Due for eye exam in April/may  4) low back pain- Sometimes has associated aching into the left anterior leg. Requests refill on volataren  5) HSV- uses valtrex, outbreaks have been well controlled.     Review of Systems See HPI  Past Medical History  Diagnosis Date  . Bipolar disorder   . Alcohol use   . Tobacco abuse   . Gestational diabetes     borderline II  . Osteoarthritis     knee    History   Social History  . Marital Status: Single    Spouse Name: N/A    Number of Children: N/A  . Years of Education: N/A   Occupational History  . BILLING [employer]   Social History Main Topics  . Smoking status: Current Every Day Smoker -- 0.25 packs/day    Types: Cigarettes  . Smokeless tobacco: Never Used  . Alcohol Use: Yes     Comment: occasionally  . Drug Use: Not on file  . Sexually Active: Not on file   Other Topics Concern  . Not on file   Social History Narrative  . No narrative on file    Past Surgical History  Procedure Laterality Date  . Cholecystectomy  2003    Family History  Problem Relation Age of Onset  . Hypertension Mother   . Hyperlipidemia Mother   . Alcohol abuse Father     No Known Allergies  Current Outpatient Prescriptions on File Prior to Visit  Medication Sig Dispense Refill  . atorvastatin (LIPITOR) 20 MG tablet Take 1 tablet (20 mg total) by mouth daily.  90 tablet  2  . buPROPion (WELLBUTRIN XL) 300 MG 24 hr tablet Take 1 tablet (300 mg total) by mouth every morning.  30 tablet  1  . cyanocobalamin  100 MCG tablet Take 1,000 mcg by mouth daily.      Marland Kitchen glucose blood (ONE TOUCH TEST STRIPS) test strip 1 each by Other route 3 (three) times daily before meals.  300 each  3  . Insulin Pen Needle (B-D ULTRAFINE III SHORT PEN) 31G X 8 MM MISC by Does not apply route daily.        . metFORMIN (GLUCOPHAGE) 500 MG tablet Take 1 tablet (500 mg total) by mouth 2 (two) times daily with a meal.  180 tablet  2  . Multiple Vitamin (MULTIVITAMIN) tablet Take 1 tablet by mouth daily.      . sitaGLIPtin (JANUVIA) 100 MG tablet Take 1 tablet (100 mg total) by mouth daily.  90 tablet  2   No current facility-administered medications on file prior to visit.    BP 130/90  Pulse 78  Temp(Src) 97.8 F (36.6 C) (Oral)  Resp 16  Ht 5\' 11"  (1.803 m)  Wt 252 lb (114.306 kg)  BMI 35.16 kg/m2  SpO2 99%       Objective:   Physical Exam  Constitutional: She is oriented to person, place, and time. She appears well-developed and well-nourished. No distress.  HENT:  Head: Normocephalic and atraumatic.  Cardiovascular: Normal rate and regular rhythm.   No murmur heard. Pulmonary/Chest: Effort normal and breath sounds normal. No respiratory distress. She has no wheezes. She has no rales.  Musculoskeletal: She exhibits no edema.  Neurological: She is alert and oriented to person, place, and time.  Skin: Skin is warm and dry.  Psychiatric: She has a normal mood and affect. Her behavior is normal. Judgment and thought content normal.          Assessment & Plan:

## 2012-09-15 NOTE — Assessment & Plan Note (Signed)
Pt says that her psychiatrist tells her she does not have bipolar but instead has depression/anxiety. She reports that this is well controlled on wellbutrin and prn xanax.

## 2012-09-15 NOTE — Assessment & Plan Note (Signed)
Controlled with valtrex daily, continue same.

## 2012-09-15 NOTE — Telephone Encounter (Signed)
Pls call pt and let her know that I would like her to add aspirin 81mg  once daily to prevent heart attack/stroke.

## 2012-09-15 NOTE — Telephone Encounter (Signed)
Informed patient of NP recommendation.

## 2012-09-15 NOTE — Assessment & Plan Note (Signed)
Refill provided for voltaren.  She understands only to use this sparingly.

## 2012-09-15 NOTE — Patient Instructions (Addendum)
Please complete your lab work prior to leaving. Resume your Januvia and metformin. Check blood sugars twice daily and record. Follow up in 6 weeks, bring your blood sugar log with you to this appointment.

## 2012-09-15 NOTE — Assessment & Plan Note (Signed)
Uncontrolled.  Reinforced importance of compliance.  Resume januvia and glucophage.

## 2012-09-15 NOTE — Addendum Note (Signed)
Addended by: Sandford Craze on: 09/15/2012 01:00 PM   Modules accepted: Orders

## 2012-09-15 NOTE — Assessment & Plan Note (Signed)
Continue statin, check flp/lft.

## 2012-09-17 ENCOUNTER — Other Ambulatory Visit: Payer: Self-pay | Admitting: Family

## 2012-09-17 MED ORDER — ATORVASTATIN CALCIUM 40 MG PO TABS: 40.0000 mg | ORAL_TABLET | Freq: Every day | ORAL | Status: DC

## 2012-09-17 NOTE — Telephone Encounter (Signed)
Pls call pt and let her know LDL is above goal.  I would like her to increase her atorvastatin from 20mg  to 40mg  and plan to repeat FLP/LFT in 3 months.  Liver function and kidney function are normal.

## 2012-09-17 NOTE — Telephone Encounter (Signed)
LEFT MESSAGE ON HOME AND CELL# OF NEED TO INCREASE ATORVASTATIN TO 40MG  DAILY. Rx SENT TO MAIL ORDER [facility].

## 2012-10-27 ENCOUNTER — Encounter: Payer: Self-pay | Admitting: Family

## 2012-10-27 ENCOUNTER — Ambulatory Visit (INDEPENDENT_AMBULATORY_CARE_PROVIDER_SITE_OTHER): Payer: 59 | Admitting: Family

## 2012-10-27 VITALS — BP 138/80 | HR 73 | Temp 98.6°F | Resp 16 | Ht 71.0 in | Wt 255.0 lb

## 2012-10-27 DIAGNOSIS — E119 Type 2 diabetes mellitus without complications: Secondary | ICD-10-CM

## 2012-10-27 LAB — HEMOGLOBIN A1C: Hgb A1c MFr Bld: 7.7 % — ABNORMAL HIGH (ref <5.7)

## 2012-10-27 MED ORDER — SITAGLIPTIN PHOS-METFORMIN HCL 50-1000 MG PO TABS: 1.0000 | ORAL_TABLET | Freq: Two times a day (BID) | ORAL | Status: DC

## 2012-10-27 NOTE — Assessment & Plan Note (Signed)
Fasting sugars above goal.  Increase Metformin by switching to Janumet 50/1000 bid.

## 2012-10-27 NOTE — Progress Notes (Signed)
Subjective:    Patient ID: Tanya Wilcox, female    DOB: 18-Nov-1970, 42 y.o.   MRN: [id number]  HPI  Tanya Wilcox is a 42 yr old female who presents today for follow up of her diabetes.  Last visit her Januvia and Metformin were refilled.  Reports that her fasting sugar 150-160.  Reports the highest sugar she has seen was 220.      Review of Systems See HPI  Past Medical History  Diagnosis Date  . Bipolar disorder   . Alcohol use   . Tobacco abuse   . Gestational diabetes     borderline II  . Osteoarthritis     knee    History   Social History  . Marital Status: Single    Spouse Name: N/A    Number of Children: N/A  . Years of Education: N/A   Occupational History  . BILLING [employer]   Social History Main Topics  . Smoking status: Current Every Day Smoker -- 0.25 packs/day    Types: Cigarettes  . Smokeless tobacco: Never Used  . Alcohol Use: Yes     Comment: occasionally  . Drug Use: Not on file  . Sexually Active: Not on file   Other Topics Concern  . Not on file   Social History Narrative  . No narrative on file    Past Surgical History  Procedure Laterality Date  . Cholecystectomy  2003    Family History  Problem Relation Age of Onset  . Hypertension Mother   . Hyperlipidemia Mother   . Alcohol abuse Father     No Known Allergies  Current Outpatient Prescriptions on File Prior to Visit  Medication Sig Dispense Refill  . ALPRAZolam (XANAX) 0.25 MG tablet Take 0.25 mg by mouth 2 (two) times daily as needed for sleep.      Marland Kitchen aspirin EC 81 MG tablet Take 81 mg by mouth daily.      Marland Kitchen atorvastatin (LIPITOR) 20 MG tablet Take 40 mg by mouth daily.      Marland Kitchen atorvastatin (LIPITOR) 40 MG tablet Take 1 tablet (40 mg total) by mouth daily.  90 tablet  1  . buPROPion (WELLBUTRIN XL) 300 MG 24 hr tablet Take 1 tablet (300 mg total) by mouth every morning.  30 tablet  1  . cyanocobalamin 100 MCG tablet Take 1,000 mcg by mouth daily.      . diclofenac (VOLTAREN)  75 MG EC tablet TAKE 1 TABLET BY MOUTH TWICE DAILY AS NEEDED FOR BACK PAIN  60 tablet  0  . glucose blood (ONE TOUCH TEST STRIPS) test strip 1 each by Other route 3 (three) times daily before meals.  300 each  3  . Insulin Pen Needle (B-D ULTRAFINE III SHORT PEN) 31G X 8 MM MISC by Does not apply route daily.        . Multiple Vitamin (MULTIVITAMIN) tablet Take 1 tablet by mouth daily.      . valACYclovir (VALTREX) 500 MG tablet Take 1 tablet (500 mg total) by mouth daily.  90 tablet  1   No current facility-administered medications on file prior to visit.    BP 138/80  Pulse 73  Temp(Src) 98.6 F (37 C) (Oral)  Resp 16  Ht 5\' 11"  (1.803 m)  Wt 255 lb (115.667 kg)  BMI 35.58 kg/m2  SpO2 99%       Objective:   Physical Exam  Constitutional: She is oriented to person, place, and time. She  appears well-developed and well-nourished. No distress.  HENT:  Head: Normocephalic and atraumatic.  Cardiovascular: Normal rate and regular rhythm.   No murmur heard. Pulmonary/Chest: Effort normal and breath sounds normal. No respiratory distress. She has no wheezes. She has no rales. She exhibits no tenderness.  Musculoskeletal: She exhibits no edema.  Lymphadenopathy:    She has no cervical adenopathy.  Neurological: She is alert and oriented to person, place, and time.  Skin: Skin is warm and dry.  Psychiatric: She has a normal mood and affect. Her behavior is normal. Judgment and thought content normal.          Assessment & Plan:

## 2012-10-27 NOTE — Patient Instructions (Addendum)
Please complete lab work prior to leaving. Follow up in 3 months.  

## 2012-11-19 ENCOUNTER — Encounter: Payer: Self-pay | Admitting: Family

## 2013-01-20 ENCOUNTER — Ambulatory Visit (INDEPENDENT_AMBULATORY_CARE_PROVIDER_SITE_OTHER): Payer: 59 | Admitting: Family

## 2013-01-20 ENCOUNTER — Encounter: Payer: Self-pay | Admitting: Family

## 2013-01-20 VITALS — BP 128/92 | HR 70 | Temp 98.3°F | Resp 16 | Ht 71.0 in | Wt 252.0 lb

## 2013-01-20 DIAGNOSIS — E785 Hyperlipidemia, unspecified: Secondary | ICD-10-CM

## 2013-01-20 DIAGNOSIS — E119 Type 2 diabetes mellitus without complications: Secondary | ICD-10-CM

## 2013-01-20 DIAGNOSIS — Z23 Encounter for immunization: Secondary | ICD-10-CM

## 2013-01-20 LAB — BASIC METABOLIC PANEL
BUN: 11 mg/dL (ref 6–23)
Chloride: 104 mEq/L (ref 96–112)
Creat: 0.85 mg/dL (ref 0.50–1.10)

## 2013-01-20 LAB — HEMOGLOBIN A1C: Mean Plasma Glucose: 154 mg/dL — ABNORMAL HIGH (ref <117)

## 2013-01-20 NOTE — Assessment & Plan Note (Addendum)
Continue Lipitor 40 mg. Plan FLP next visit.

## 2013-01-20 NOTE — Patient Instructions (Addendum)
Please complete your lab work prior to leaving. Follow up in 3 months.   

## 2013-01-20 NOTE — Progress Notes (Signed)
  Subjective:    Patient ID: Tanya Wilcox, female    DOB: 1971/06/14, 42 y.o.   MRN: [id number]  HPI Ms. Tanya Wilcox is a 42 year old female who presents today to follow up  DM- states is doing well after medication changes to janumet. States blood sugars have gotten better (averaging around 180, with highest 258 and lowest 140). Denies polyuria, polydipsia, diarrhea. Last eye exam was 2012, one scheduled in two weeks. Pneumovax 2008. Cholesterol- States is doing well on Lipitor 40 mg, no arthralgias.    Review of Systems  Constitutional: Negative for fever, chills, diaphoresis, fatigue and unexpected weight change.  Gastrointestinal: Negative for nausea, vomiting, diarrhea and constipation.  Endocrine: Negative for polydipsia.  Genitourinary: Negative for dysuria and difficulty urinating.  Musculoskeletal: Negative for arthralgias.       Objective:   Physical Exam  Constitutional: She is oriented to person, place, and time. She appears well-developed and well-nourished. No distress.  HENT:  Head: Normocephalic and atraumatic.  Neck: Normal range of motion.  Cardiovascular: Normal rate, regular rhythm, normal heart sounds and intact distal pulses.   Pulmonary/Chest: Effort normal and breath sounds normal. No respiratory distress. She has no wheezes. She has no rales.  Neurological: She is alert and oriented to person, place, and time.  Skin: She is not diaphoretic.  Psychiatric: She has a normal mood and affect. Her behavior is normal. Judgment and thought content normal.          Assessment & Plan:

## 2013-01-20 NOTE — Assessment & Plan Note (Addendum)
Clinically stable. Continue Janumet- obtain A1C.  Pneumovax today. Keep upcoming apt for eye exam in 2 weeks.

## 2013-01-22 ENCOUNTER — Telehealth: Payer: Self-pay | Admitting: Family

## 2013-01-22 NOTE — Telephone Encounter (Signed)
A1C is 7.0 (down from 7.7).  Kidney function is normal.   Continue janumet, diet, exercise.

## 2013-01-22 NOTE — Telephone Encounter (Signed)
Patient informed, understood & agreed/SLS  

## 2013-01-28 ENCOUNTER — Ambulatory Visit: Payer: 59 | Admitting: Family

## 2013-01-29 ENCOUNTER — Other Ambulatory Visit: Payer: Self-pay

## 2013-02-05 ENCOUNTER — Telehealth: Payer: Self-pay | Admitting: *Deleted

## 2013-02-05 NOTE — Telephone Encounter (Signed)
How much is she smoking/day.  I can send proper dose to her pharmacy if she wishes but pls let her know that it is also available OTC.

## 2013-02-05 NOTE — Telephone Encounter (Signed)
Left detailed message on cell# to call with requested info below.

## 2013-02-05 NOTE — Telephone Encounter (Signed)
Pt left message stating her job is requiring that she stop smoking and she is requesting rx for nicorette patch to assist with this.  Please advise.

## 2013-02-09 ENCOUNTER — Encounter: Payer: Self-pay | Admitting: *Deleted

## 2013-02-09 NOTE — Telephone Encounter (Signed)
Spoke with pt. Pt states she decided to purchase this OTC. Currently smoking 1 pack cigarettes a week.

## 2013-02-09 NOTE — Telephone Encounter (Signed)
OK- she should start with the 21 mg nicoderm patch.

## 2013-02-10 NOTE — Telephone Encounter (Signed)
Notified pt and she voices understanding. 

## 2013-03-20 LAB — LIPID PANEL
Cholesterol, Total: 181
HDL: 38 mg/dL (ref 35–70)
VLDL: 34 mg/dL

## 2013-04-22 ENCOUNTER — Encounter: Payer: Self-pay | Admitting: Family

## 2013-04-22 ENCOUNTER — Other Ambulatory Visit: Payer: Self-pay

## 2013-04-22 ENCOUNTER — Ambulatory Visit (INDEPENDENT_AMBULATORY_CARE_PROVIDER_SITE_OTHER): Payer: 59 | Admitting: Family

## 2013-04-22 VITALS — BP 110/86 | HR 80 | Temp 98.5°F | Resp 16 | Ht 71.0 in | Wt 249.0 lb

## 2013-04-22 DIAGNOSIS — E785 Hyperlipidemia, unspecified: Secondary | ICD-10-CM

## 2013-04-22 DIAGNOSIS — F411 Generalized anxiety disorder: Secondary | ICD-10-CM

## 2013-04-22 DIAGNOSIS — E119 Type 2 diabetes mellitus without complications: Secondary | ICD-10-CM

## 2013-04-22 DIAGNOSIS — F419 Anxiety disorder, unspecified: Secondary | ICD-10-CM

## 2013-04-22 DIAGNOSIS — Z23 Encounter for immunization: Secondary | ICD-10-CM

## 2013-04-22 DIAGNOSIS — F17200 Nicotine dependence, unspecified, uncomplicated: Secondary | ICD-10-CM

## 2013-04-22 MED ORDER — CANAGLIFLOZIN 100 MG PO TABS: 1.0000 | ORAL_TABLET | Freq: Every day | ORAL | Status: DC

## 2013-04-22 MED ORDER — METFORMIN HCL ER 500 MG PO TB24: 2000.0000 mg | ORAL_TABLET | Freq: Every day | ORAL | Status: DC

## 2013-04-22 MED ORDER — SITAGLIPTIN PHOSPHATE 100 MG PO TABS: 100.0000 mg | ORAL_TABLET | Freq: Every day | ORAL | Status: DC

## 2013-04-22 NOTE — Assessment & Plan Note (Signed)
We discussed the risk of ongoing tobacco abuse.  Form was signed for her employer.  Recommended that she use the nicotine patch- taper as outlined in AVS- hand written rx was provided for pt so that she may submit for flex spending.  >5 minutes spent counseling pt on smoking cessation.

## 2013-04-22 NOTE — Patient Instructions (Addendum)
Please follow up in 10 weeks. Stop janumet. Start glucophage xr and [place]. We are also adding invokana once daily. You will be contacted about your referral to the diabetic educator. Good luck quitting smoking. Start Nicorette patch- 21-mg patch once daily for 6 weeks, then 14-mg patch once daily for 2 weeks, then 7-mg patch once daily for 2 weeks.

## 2013-04-22 NOTE — Telephone Encounter (Signed)
Pt states Januvia is not covered by her insurance at local pharmacy wants sent to [facility].

## 2013-04-22 NOTE — Progress Notes (Signed)
Subjective:    Patient ID: Tanya Wilcox, female    DOB: [birth year], 41 y.o.   MRN: [id number]  HPI  Tanya Wilcox is a 42 yr old female who presents today for follow up.  1) DM2-  Reports that she has had a lot of family issues.  Reports that she is having more GI upset with janumet.  Reports that she has only been taking 1/2 of the time.    2) Hyperlipidemia- currently maintained on lipitor. Notes that she had not been taking like she was supposed.    3) Tobacco abuse- reports that she enjoys smoking.  Has stopped in the past with the patch. Did not purchase.    4) Bipolar disorder/anxiety- she is followed by psychiatry.  Reports that she cancelled most recent visit with psychiatry but continues to meet with a therapist. She is struggling with her 12 year old daughter.  They have been attending sessions together.  Pt reports that this seems to be helping.    Review of Systems See HPI  Past Medical History  Diagnosis Date  . Bipolar disorder   . Alcohol use   . Tobacco abuse   . Gestational diabetes     borderline II  . Osteoarthritis     knee    History   Social History  . Marital Status: Single    Spouse Name: N/A    Number of Children: N/A  . Years of Education: N/A   Occupational History  . BILLING [employer]   Social History Main Topics  . Smoking status: Current Every Day Smoker    Types: Cigarettes    Last Attempt to Quit: 01/20/2013  . Smokeless tobacco: Never Used     Comment: 1 pack cigarettes a week  . Alcohol Use: Yes     Comment: occasionally  . Drug Use: Not on file  . Sexual Activity: Not on file   Other Topics Concern  . Not on file   Social History Narrative  . No narrative on file    Past Surgical History  Procedure Laterality Date  . Cholecystectomy  2003    Family History  Problem Relation Age of Onset  . Hypertension Mother   . Hyperlipidemia Mother   . Alcohol abuse Father     No Known Allergies  Current Outpatient  Prescriptions on File Prior to Visit  Medication Sig Dispense Refill  . ALPRAZolam (XANAX) 0.25 MG tablet Take 0.25 mg by mouth 2 (two) times daily as needed for sleep.      Marland Kitchen aspirin EC 81 MG tablet Take 81 mg by mouth daily.      Marland Kitchen atorvastatin (LIPITOR) 40 MG tablet Take 1 tablet (40 mg total) by mouth daily.  90 tablet  1  . buPROPion (WELLBUTRIN XL) 300 MG 24 hr tablet Take 1 tablet (300 mg total) by mouth every morning.  30 tablet  1  . cyanocobalamin 100 MCG tablet Take 1,000 mcg by mouth daily.      . diclofenac (VOLTAREN) 75 MG EC tablet TAKE 1 TABLET BY MOUTH TWICE DAILY AS NEEDED FOR BACK PAIN  60 tablet  0  . glucose blood (ONE TOUCH TEST STRIPS) test strip 1 each by Other route 3 (three) times daily before meals.  300 each  3  . Insulin Pen Needle (B-D ULTRAFINE III SHORT PEN) 31G X 8 MM MISC by Does not apply route daily.        . Multiple Vitamin (MULTIVITAMIN) tablet Take  1 tablet by mouth daily.      . valACYclovir (VALTREX) 500 MG tablet Take 1 tablet (500 mg total) by mouth daily.  90 tablet  1   No current facility-administered medications on file prior to visit.    BP 110/86  Pulse 80  Temp(Src) 98.5 F (36.9 C) (Oral)  Resp 16  Ht 5\' 11"  (1.803 m)  Wt 249 lb 0.6 oz (112.964 kg)  BMI 34.75 kg/m2  SpO2 99%       Objective:   Physical Exam  Constitutional: She is oriented to person, place, and time. She appears well-developed and well-nourished. No distress.  HENT:  Head: Normocephalic and atraumatic.  Cardiovascular: Normal rate and regular rhythm.   No murmur heard. Pulmonary/Chest: Effort normal and breath sounds normal. No respiratory distress. She has no wheezes. She has no rales. She exhibits no tenderness.  Musculoskeletal: She exhibits no edema.  Lymphadenopathy:    She has no cervical adenopathy.  Neurological: She is alert and oriented to person, place, and time.  Skin: Skin is warm and dry.  Psychiatric: She has a normal mood and affect. Her  behavior is normal. Judgment and thought content normal.  Became briefly tearful upon discussion of her daughter          Assessment & Plan:

## 2013-04-22 NOTE — Assessment & Plan Note (Signed)
She brings with her today LDL from her health fair.  LDL was 109. Admits to not taking lipitor regularly. Advised pt to resume.

## 2013-04-22 NOTE — Assessment & Plan Note (Signed)
Stable. Dealing with difficult situation with her daughter. Monitor.

## 2013-04-22 NOTE — Assessment & Plan Note (Signed)
Will stop janumet.  Change to [place] 100mg  + Glucophage xr to see if this has improved GI tolerability.  Trial of invokana 100.  # 15 tabs provided today.  Will refer to the diabetic educator.

## 2013-05-05 ENCOUNTER — Encounter: Payer: 59 | Attending: Family | Admitting: *Deleted

## 2013-05-05 ENCOUNTER — Encounter: Payer: Self-pay | Admitting: *Deleted

## 2013-05-05 VITALS — Ht 67.0 in | Wt 247.5 lb

## 2013-05-05 DIAGNOSIS — E119 Type 2 diabetes mellitus without complications: Secondary | ICD-10-CM | POA: Insufficient documentation

## 2013-05-05 DIAGNOSIS — Z713 Dietary counseling and surveillance: Secondary | ICD-10-CM | POA: Insufficient documentation

## 2013-05-05 NOTE — Patient Instructions (Signed)
Plan:  Aim for 3 Carb Choices per meal (45 grams) +/- 1 either way  Aim for 0-2 Carbs per snack if hungry  Consider reading food labels for Total Carbohydrate of foods Consider  increasing your activity level by using the gym at work daily as tolerated Continue checking BG at alternate times per day as directed by MD

## 2013-05-05 NOTE — Progress Notes (Signed)
Appt start time: 1500 end time:  1630.  Assessment:  Patient was seen on  05/05/13 for individual diabetes education. Lives with 42 yo daughter. Pt shops and prepares the meals. Likes to go bowling. 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 SMBG 3-4 times a day, reported range now between 146 and 225 mg/dl since starting on Invokana.   Current HbA1c: 9.2%  MEDICATIONS: see list   DIETARY INTAKE:  Usual eating pattern includes 3 meals and 1-2 snacks per day.  Everyday foods include good variety of all food groups.  Avoided foods include fried foods.    24-hr recall:  B ( AM): fried egg with cheese and 1 sausage sandwich on honey wheat bread, OR boiled egg with tuna, coffee with Stevia and flavored creamer Snk ( AM): [place] pepperoni and string cheese, (sunflower seeds all day) L ( PM): brings from home; sandwich OR salad with veggies, lite dressing, flavored water Snk ( PM): yogurt if anything D ( PM): lean meat, occasionally starch, steamed vegetables, water or regular or diet soda Snk ( PM): none unless PNB Beverages: coffee, water, diet soda or regular soda  Usual physical activity: none  Estimated energy needs: 1400 calories 158 g carbohydrates 105 g protein 39 g fat  Progress Towards Goal(s):  In progress.   Nutritional Diagnosis:  NB-1.1 Food and nutrition-related knowledge deficit As related to diabetes control.  As evidenced by A1c of 9.2%.    Intervention:  Nutrition counseling provided.  Discussed diabetes disease process and treatment options.  Discussed physiology of diabetes and role of obesity on insulin resistance.  Encouraged moderate weight reduction to improve glucose levels.  Discussed role of medications and diet in glucose control  Provided education on macronutrients on glucose levels.  Provided education on carb counting, importance of regularly scheduled meals/snacks, and meal  planning  Discussed effects of physical activity on glucose levels and long-term glucose control.  Recommended 150 minutes of physical activity/week.  Reviewed patient medications.  Discussed role of medication on blood glucose and possible side effects  Discussed blood glucose monitoring and interpretation.  Discussed recommended target ranges and individual ranges.    Described short-term complications: hyper- and hypo-glycemia.  Discussed causes,symptoms, and treatment options.  Discussed prevention, detection, and treatment of long-term complications.  Discussed the role of prolonged elevated glucose levels on body systems.  Provided information on:  Role of stress on blood glucose levels and discussed strategies to manage psychosocial issues.  Recommendations for long-term diabetes self-care.  Established checklist for medical, dental, and emotional self-care.  Plan:  Aim for 3 Carb Choices per meal (45 grams) +/- 1 either way  Aim for 0-2 Carbs per snack if hungry  Consider reading food labels for Total Carbohydrate of foods Consider  increasing your activity level by using the gym at work daily as tolerated Continue checking BG at alternate times per day as directed by MD   Handouts given during visit include: Living Well with Diabetes Carb Counting and Food Label handouts Meal Plan Card  Diabetes Medication Sheet  Barriers to learning/adherance to lifestyle change: single mom working over 60 hours a week  Diabetes self-care support plan:   [facility] support group available  Continued diabetes education  Monitoring/Evaluation:  Dietary intake, exercise, reading food labels, and body weight in 4 week(s).

## 2013-05-27 ENCOUNTER — Ambulatory Visit: Payer: 59

## 2013-05-27 ENCOUNTER — Ambulatory Visit (INDEPENDENT_AMBULATORY_CARE_PROVIDER_SITE_OTHER): Payer: 59 | Admitting: Internal Medicine

## 2013-05-27 VITALS — BP 140/92 | HR 80 | Temp 98.6°F | Resp 18 | Wt 244.0 lb

## 2013-05-27 DIAGNOSIS — S46911A Strain of unspecified muscle, fascia and tendon at shoulder and upper arm level, right arm, initial encounter: Secondary | ICD-10-CM

## 2013-05-27 DIAGNOSIS — IMO0002 Reserved for concepts with insufficient information to code with codable children: Secondary | ICD-10-CM

## 2013-05-27 DIAGNOSIS — M7520 Bicipital tendinitis, unspecified shoulder: Secondary | ICD-10-CM

## 2013-05-27 DIAGNOSIS — M25519 Pain in unspecified shoulder: Secondary | ICD-10-CM

## 2013-05-27 DIAGNOSIS — M25511 Pain in right shoulder: Secondary | ICD-10-CM

## 2013-05-27 DIAGNOSIS — M7521 Bicipital tendinitis, right shoulder: Secondary | ICD-10-CM

## 2013-05-27 MED ORDER — IBUPROFEN 600 MG PO TABS: 600.0000 mg | ORAL_TABLET | Freq: Three times a day (TID) | ORAL | Status: DC | PRN

## 2013-05-27 MED ORDER — HYDROCODONE-ACETAMINOPHEN 5-325 MG PO TABS: 1.0000 | ORAL_TABLET | Freq: Four times a day (QID) | ORAL | Status: DC | PRN

## 2013-05-27 NOTE — Patient Instructions (Addendum)
Bicipital Tendonitis Bicipital tendonitis refers to redness, soreness, and swelling (inflammation) or irritation of the bicep tendon. The biceps muscle is located between the elbow and shoulder of the inner arm. The tendon heads, similar to pieces of rope, connect the bicep muscle to the shoulder socket. They are called short head and long head tendons. When tendonitis occurs, the long head tendon is inflamed and swollen, and may be thickened or partially torn.  Bicipital tendonitis can occur with other problems as well, such as arthritis in the shoulder or acromioclavicular joints, tears in the tendons, or other rotator cuff problems.  CAUSES  Overuse of of the arms for overhead activities is the major cause of tendonitis. Many athletes, such as swimmers, baseball players, and tennis players are prone to bicipital tendonitis. Jobs that require manual labor or routine chores, especially chores involving overhead activities can result in overuse and tendonitis. SYMPTOMS Symptoms may include:  Pain in and around the front of the shoulder. Pain may be worse with overhead motion.  Pain or aching that radiates down the arm.  Clicking or shifting sensations in the shoulder. DIAGNOSIS Your caregiver may perform the following:  Physical exam and tests of the biceps and shoulder to observe range of motion, strength, and stability.  X-rays or magnetic resonance imaging (MRI) to confirm the diagnosis. In most common cases, these tests are not necessary. Since other problems may exist in the shoulder or rotator cuff, additional tests may be recommended. TREATMENT Treatment may include the following:  Medications  Your caregiver may prescribe over-the-counter pain relievers.  Steroid injections, such as cortisone, may be recommended. These may help to reduce inflammation and pain.  Physical Therapy - Your caregiver may recommend gentle exercises with the arm. These can help restore strength and range  of motion. They may be done at home or with a physical therapist's supervision and input.  Surgery - Arthroscopic or open surgery sometimes is necessary. Surgery may include:  Reattachment or repair of the tendon at the shoulder socket.  Removal of the damaged section of the tendon.  Anchoring the tendon to a different area of the shoulder (tenodesis). HOME CARE INSTRUCTIONS   Avoid overhead motion of the affected arm or any other motion that causes pain.  Take medication for pain as directed. Do not take these for more than 3 weeks, unless directed to do so by your caregiver.  Ice the affected area for 20 minutes at a time, 3-4 times per day. Place a towel on the skin over the painful area and the ice or cold pack over the towel. Do not place ice directly on the skin.  Perform gentle exercises at home as directed. These will increase strength and flexibility. PREVENTION  Modify your activities as much as possible to protect your arm. A physical therapist or sports medicine physician can help you understand options for safe motion.  Avoid repetitive overhead pulling, lifting, reaching, and throwing until your caregiver tells you it is ok to resume these activities. SEEK MEDICAL CARE IF:  Your pain worsens.  You have difficulty moving the affected arm.  You have trouble performing any of the self-care instructions. MAKE SURE YOU:   Understand these instructions.  Will watch your condition.  Will get help right away if you are not doing well or get worse. Document Released: 08/11/2010 Document Revised: 10/01/2011 Document Reviewed: 08/11/2010 [facility] Patient Information 2014 [place], [place]. Impingement Syndrome, Rotator Cuff, Bursitis with Rehab Impingement syndrome is a condition that involves inflammation of  the tendons of the rotator cuff and the subacromial bursa, that causes pain in the shoulder. The rotator cuff consists of four tendons and muscles that control much of  the shoulder and upper arm function. The subacromial bursa is a fluid filled sac that helps reduce friction between the rotator cuff and one of the bones of the shoulder (acromion). Impingement syndrome is usually an overuse injury that causes swelling of the bursa (bursitis), swelling of the tendon (tendonitis), and/or a tear of the tendon (strain). Strains are classified into three categories. Grade 1 strains cause pain, but the tendon is not lengthened. Grade 2 strains include a lengthened ligament, due to the ligament being stretched or partially ruptured. With grade 2 strains there is still function, although the function may be decreased. Grade 3 strains include a complete tear of the tendon or muscle, and function is usually impaired. SYMPTOMS   Pain around the shoulder, often at the outer portion of the upper arm.  Pain that gets worse with shoulder function, especially when reaching overhead or lifting.  Sometimes, aching when not using the arm.  Pain that wakes you up at night.  Sometimes, tenderness, swelling, warmth, or redness over the affected area.  Loss of strength.  Limited motion of the shoulder, especially reaching behind the back (to the back pocket or to unhook bra) or across your body.  Crackling sound (crepitation) when moving the arm.  Biceps tendon pain and inflammation (in the front of the shoulder). Worse when bending the elbow or lifting. CAUSES  Impingement syndrome is often an overuse injury, in which chronic (repetitive) motions cause the tendons or bursa to become inflamed. A strain occurs when a force is paced on the tendon or muscle that is greater than it can withstand. Common mechanisms of injury include: Stress from sudden increase in duration, frequency, or intensity of training.  Direct hit (trauma) to the shoulder.  Aging, erosion of the tendon with normal use.  Bony bump on shoulder (acromial spur). RISK INCREASES WITH:  Contact sports  (football, wrestling, boxing).  Throwing sports (baseball, tennis, volleyball).  Weightlifting and bodybuilding.  Heavy labor.  Previous injury to the rotator cuff, including impingement.  Poor shoulder strength and flexibility.  Failure to warm up properly before activity.  Inadequate protective equipment.  Old age.  Bony bump on shoulder (acromial spur). PREVENTION   Warm up and stretch properly before activity.  Allow for adequate recovery between workouts.  Maintain physical fitness:  Strength, flexibility, and endurance.  Cardiovascular fitness.  Learn and use proper exercise technique. PROGNOSIS  If treated properly, impingement syndrome usually goes away within 6 weeks. Sometimes surgery is required.  RELATED COMPLICATIONS   Longer healing time if not properly treated, or if not given enough time to heal.  Recurring symptoms, that result in a chronic condition.  Shoulder stiffness, frozen shoulder, or loss of motion.  Rotator cuff tendon tear.  Recurring symptoms, especially if activity is resumed too soon, with overuse, with a direct blow, or when using poor technique. TREATMENT  Treatment first involves the use of ice and medicine, to reduce pain and inflammation. The use of strengthening and stretching exercises may help reduce pain with activity. These exercises may be performed at home or with a therapist. If non-surgical treatment is unsuccessful after more than 6 months, surgery may be advised. After surgery and rehabilitation, activity is usually possible in 3 months.  MEDICATION  If pain medicine is needed, nonsteroidal anti-inflammatory medicines (aspirin and ibuprofen), or  other minor pain relievers (acetaminophen), are often advised.  Do not take pain medicine for 7 days before surgery.  Prescription pain relievers may be given, if your caregiver thinks they are needed. Use only as directed and only as much as you need.  Corticosteroid  injections may be given by your caregiver. These injections should be reserved for the most serious cases, because they may only be given a certain number of times. HEAT AND COLD  Cold treatment (icing) should be applied for 10 to 15 minutes every 2 to 3 hours for inflammation and pain, and immediately after activity that aggravates your symptoms. Use ice packs or an ice massage.  Heat treatment may be used before performing stretching and strengthening activities prescribed by your caregiver, physical therapist, or athletic trainer. Use a heat pack or a warm water soak. SEEK MEDICAL CARE IF:   Symptoms get worse or do not improve in 4 to 6 weeks, despite treatment.  New, unexplained symptoms develop. (Drugs used in treatment may produce side effects.) EXERCISES  RANGE OF MOTION (ROM) AND STRETCHING EXERCISES - Impingement Syndrome (Rotator Cuff  Tendinitis, Bursitis) These exercises may help you when beginning to rehabilitate your injury. Your symptoms may go away with or without further involvement from your physician, physical therapist or athletic trainer. While completing these exercises, remember:   Restoring tissue flexibility helps normal motion to return to the joints. This allows healthier, less painful movement and activity.  An effective stretch should be held for at least 30 seconds.  A stretch should never be painful. You should only feel a gentle lengthening or release in the stretched tissue. STRETCH  Flexion, Standing  Stand with good posture. With an underhand grip on your right / left hand, and an overhand grip on the opposite hand, grasp a broomstick or cane so that your hands are a little more than shoulder width apart.  Keeping your right / left elbow straight and shoulder muscles relaxed, push the stick with your opposite hand, to raise your right / left arm in front of your body and then overhead. Raise your arm until you feel a stretch in your right / left shoulder,  but before you have increased shoulder pain.  Try to avoid shrugging your right / left shoulder as your arm rises, by keeping your shoulder blade tucked down and toward your mid-back spine. Hold for __________ seconds.  Slowly return to the starting position. Repeat __________ times. Complete this exercise __________ times per day. STRETCH  Abduction, Supine  Lie on your back. With an underhand grip on your right / left hand and an overhand grip on the opposite hand, grasp a broomstick or cane so that your hands are a little more than shoulder width apart.  Keeping your right / left elbow straight and your shoulder muscles relaxed, push the stick with your opposite hand, to raise your right / left arm out to the side of your body and then overhead. Raise your arm until you feel a stretch in your right / left shoulder, but before you have increased shoulder pain.  Try to avoid shrugging your right / left shoulder as your arm rises, by keeping your shoulder blade tucked down and toward your mid-back spine. Hold for __________ seconds.  Slowly return to the starting position. Repeat __________ times. Complete this exercise __________ times per day. ROM  Flexion, Active-Assisted  Lie on your back. You may bend your knees for comfort.  Grasp a broomstick or cane so  your hands are about shoulder width apart. Your right / left hand should grip the end of the stick, so that your hand is positioned "thumbs-up," as if you were about to shake hands.  Using your healthy arm to lead, raise your right / left arm overhead, until you feel a gentle stretch in your shoulder. Hold for __________ seconds.  Use the stick to assist in returning your right / left arm to its starting position. Repeat __________ times. Complete this exercise __________ times per day.  ROM - Internal Rotation, Supine   Lie on your back on a firm surface. Place your right / left elbow about 60 degrees away from your side. Elevate  your elbow with a folded towel, so that the elbow and shoulder are the same height.  Using a broomstick or cane and your strong arm, pull your right / left hand toward your body until you feel a gentle stretch, but no increase in your shoulder pain. Keep your shoulder and elbow in place throughout the exercise.  Hold for __________ seconds. Slowly return to the starting position. Repeat __________ times. Complete this exercise __________ times per day. STRETCH - Internal Rotation  Place your right / left hand behind your back, palm up.  Throw a towel or belt over your opposite shoulder. Grasp the towel with your right / left hand.  While keeping an upright posture, gently pull up on the towel, until you feel a stretch in the front of your right / left shoulder.  Avoid shrugging your right / left shoulder as your arm rises, by keeping your shoulder blade tucked down and toward your mid-back spine.  Hold for __________ seconds. Release the stretch, by lowering your healthy hand. Repeat __________ times. Complete this exercise __________ times per day. ROM - Internal Rotation   Using an underhand grip, grasp a stick behind your back with both hands.  While standing upright with good posture, slide the stick up your back until you feel a mild stretch in the front of your shoulder.  Hold for __________ seconds. Slowly return to your starting position. Repeat __________ times. Complete this exercise __________ times per day.  STRETCH  Posterior Shoulder Capsule   Stand or sit with good posture. Grasp your right / left elbow and draw it across your chest, keeping it at the same height as your shoulder.  Pull your elbow, so your upper arm comes in closer to your chest. Pull until you feel a gentle stretch in the back of your shoulder.  Hold for __________ seconds. Repeat __________ times. Complete this exercise __________ times per day. STRENGTHENING EXERCISES - Impingement Syndrome  (Rotator Cuff Tendinitis, Bursitis) These exercises may help you when beginning to rehabilitate your injury. They may resolve your symptoms with or without further involvement from your physician, physical therapist or athletic trainer. While completing these exercises, remember:  Muscles can gain both the endurance and the strength needed for everyday activities through controlled exercises.  Complete these exercises as instructed by your physician, physical therapist or athletic trainer. Increase the resistance and repetitions only as guided.  You may experience muscle soreness or fatigue, but the pain or discomfort you are trying to eliminate should never worsen during these exercises. If this pain does get worse, stop and make sure you are following the directions exactly. If the pain is still present after adjustments, discontinue the exercise until you can discuss the trouble with your clinician.  During your recovery, avoid activity or exercises which  involve actions that place your injured hand or elbow above your head or behind your back or head. These positions stress the tissues which you are trying to heal. STRENGTH - Scapular Depression and Adduction   With good posture, sit on a firm chair. Support your arms in front of you, with pillows, arm rests, or on a table top. Have your elbows in line with the sides of your body.  Gently draw your shoulder blades down and toward your mid-back spine. Gradually increase the tension, without tensing the muscles along the top of your shoulders and the back of your neck.  Hold for __________ seconds. Slowly release the tension and relax your muscles completely before starting the next repetition.  After you have practiced this exercise, remove the arm support and complete the exercise in standing as well as sitting position. Repeat __________ times. Complete this exercise __________ times per day.  STRENGTH - Shoulder Abductors, Isometric  With  good posture, stand or sit about 4-6 inches from a wall, with your right / left side facing the wall.  Bend your right / left elbow. Gently press your right / left elbow into the wall. Increase the pressure gradually, until you are pressing as hard as you can, without shrugging your shoulder or increasing any shoulder discomfort.  Hold for __________ seconds.  Release the tension slowly. Relax your shoulder muscles completely before you begin the next repetition. Repeat __________ times. Complete this exercise __________ times per day.  STRENGTH - External Rotators, Isometric  Keep your right / left elbow at your side and bend it 90 degrees.  Step into a door frame so that the outside of your right / left wrist can press against the door frame without your upper arm leaving your side.  Gently press your right / left wrist into the door frame, as if you were trying to swing the back of your hand away from your stomach. Gradually increase the tension, until you are pressing as hard as you can, without shrugging your shoulder or increasing any shoulder discomfort.  Hold for __________ seconds.  Release the tension slowly. Relax your shoulder muscles completely before you begin the next repetition. Repeat __________ times. Complete this exercise __________ times per day.  STRENGTH - Supraspinatus   Stand or sit with good posture. Grasp a __________ weight, or an exercise band or tubing, so that your hand is "thumbs-up," like you are shaking hands.  Slowly lift your right / left arm in a "V" away from your thigh, diagonally into the space between your side and straight ahead. Lift your hand to shoulder height or as far as you can, without increasing any shoulder pain. At first, many people do not lift their hands above shoulder height.  Avoid shrugging your right / left shoulder as your arm rises, by keeping your shoulder blade tucked down and toward your mid-back spine.  Hold for __________  seconds. Control the descent of your hand, as you slowly return to your starting position. Repeat __________ times. Complete this exercise __________ times per day.  STRENGTH - External Rotators  Secure a rubber exercise band or tubing to a fixed object (table, pole) so that it is at the same height as your right / left elbow when you are standing or sitting on a firm surface.  Stand or sit so that the secured exercise band is at your uninjured side.  Bend your right / left elbow 90 degrees. Place a folded towel or small pillow  under your right / left arm, so that your elbow is a few inches away from your side.  Keeping the tension on the exercise band, pull it away from your body, as if pivoting on your elbow. Be sure to keep your body steady, so that the movement is coming only from your rotating shoulder.  Hold for __________ seconds. Release the tension in a controlled manner, as you return to the starting position. Repeat __________ times. Complete this exercise __________ times per day.  STRENGTH - Internal Rotators   Secure a rubber exercise band or tubing to a fixed object (table, pole) so that it is at the same height as your right / left elbow when you are standing or sitting on a firm surface.  Stand or sit so that the secured exercise band is at your right / left side.  Bend your elbow 90 degrees. Place a folded towel or small pillow under your right / left arm so that your elbow is a few inches away from your side.  Keeping the tension on the exercise band, pull it across your body, toward your stomach. Be sure to keep your body steady, so that the movement is coming only from your rotating shoulder.  Hold for __________ seconds. Release the tension in a controlled manner, as you return to the starting position. Repeat __________ times. Complete this exercise __________ times per day.  STRENGTH - Scapular Protractors, Standing   Stand arms length away from a wall. Place your  hands on the wall, keeping your elbows straight.  Begin by dropping your shoulder blades down and toward your mid-back spine.  To strengthen your protractors, keep your shoulder blades down, but slide them forward on your rib cage. It will feel as if you are lifting the back of your rib cage away from the wall. This is a subtle motion and can be challenging to complete. Ask your caregiver for further instruction, if you are not sure you are doing the exercise correctly.  Hold for __________ seconds. Slowly return to the starting position, resting the muscles completely before starting the next repetition. Repeat __________ times. Complete this exercise __________ times per day. STRENGTH - Scapular Protractors, Supine  Lie on your back on a firm surface. Extend your right / left arm straight into the air while holding a __________ weight in your hand.  Keeping your head and back in place, lift your shoulder off the floor.  Hold for __________ seconds. Slowly return to the starting position, and allow your muscles to relax completely before starting the next repetition. Repeat __________ times. Complete this exercise __________ times per day. STRENGTH - Scapular Protractors, Quadruped  Get onto your hands and knees, with your shoulders directly over your hands (or as close as you can be, comfortably).  Keeping your elbows locked, lift the back of your rib cage up into your shoulder blades, so your mid-back rounds out. Keep your neck muscles relaxed.  Hold this position for __________ seconds. Slowly return to the starting position and allow your muscles to relax completely before starting the next repetition. Repeat __________ times. Complete this exercise __________ times per day.  STRENGTH - Scapular Retractors  Secure a rubber exercise band or tubing to a fixed object (table, pole), so that it is at the height of your shoulders when you are either standing, or sitting on a firm armless  chair.  With a palm down grip, grasp an end of the band in each hand. Straighten your  elbows and lift your hands straight in front of you, at shoulder height. Step back, away from the secured end of the band, until it becomes tense.  Squeezing your shoulder blades together, draw your elbows back toward your sides, as you bend them. Keep your upper arms lifted away from your body throughout the exercise.  Hold for __________ seconds. Slowly ease the tension on the band, as you reverse the directions and return to the starting position. Repeat __________ times. Complete this exercise __________ times per day. STRENGTH - Shoulder Extensors   Secure a rubber exercise band or tubing to a fixed object (table, pole) so that it is at the height of your shoulders when you are either standing, or sitting on a firm armless chair.  With a thumbs-up grip, grasp an end of the band in each hand. Straighten your elbows and lift your hands straight in front of you, at shoulder height. Step back, away from the secured end of the band, until it becomes tense.  Squeezing your shoulder blades together, pull your hands down to the sides of your thighs. Do not allow your hands to go behind you.  Hold for __________ seconds. Slowly ease the tension on the band, as you reverse the directions and return to the starting position. Repeat __________ times. Complete this exercise __________ times per day.  STRENGTH - Scapular Retractors and External Rotators   Secure a rubber exercise band or tubing to a fixed object (table, pole) so that it is at the height as your shoulders, when you are either standing, or sitting on a firm armless chair.  With a palm down grip, grasp an end of the band in each hand. Bend your elbows 90 degrees and lift your elbows to shoulder height, at your sides. Step back, away from the secured end of the band, until it becomes tense.  Squeezing your shoulder blades together, rotate your shoulders  so that your upper arms and elbows remain stationary, but your fists travel upward to head height.  Hold for __________ seconds. Slowly ease the tension on the band, as you reverse the directions and return to the starting position. Repeat __________ times. Complete this exercise __________ times per day.  STRENGTH - Scapular Retractors and External Rotators, Rowing   Secure a rubber exercise band or tubing to a fixed object (table, pole) so that it is at the height of your shoulders, when you are either standing, or sitting on a firm armless chair.  With a palm down grip, grasp an end of the band in each hand. Straighten your elbows and lift your hands straight in front of you, at shoulder height. Step back, away from the secured end of the band, until it becomes tense.  Step 1: Squeeze your shoulder blades together. Bending your elbows, draw your hands to your chest, as if you are rowing a boat. At the end of this motion, your hands and elbow should be at shoulder height and your elbows should be out to your sides.  Step 2: Rotate your shoulders, to raise your hands above your head. Your forearms should be vertical and your upper arms should be horizontal.  Hold for __________ seconds. Slowly ease the tension on the band, as you reverse the directions and return to the starting position. Repeat __________ times. Complete this exercise __________ times per day.  STRENGTH  Scapular Depressors  Find a sturdy chair without wheels, such as a dining room chair.  Keeping your feet on the  floor, and your hands on the chair arms, lift your bottom up from the seat, and lock your elbows.  Keeping your elbows straight, allow gravity to pull your body weight down. Your shoulders will rise toward your ears.  Raise your body against gravity by drawing your shoulder blades down your back, shortening the distance between your shoulders and ears. Although your feet should always maintain contact with the  floor, your feet should progressively support less body weight, as you get stronger.  Hold for __________ seconds. In a controlled and slow manner, lower your body weight to begin the next repetition. Repeat __________ times. Complete this exercise __________ times per day.  Document Released: 07/09/2005 Document Revised: 10/01/2011 Document Reviewed: 10/21/2008 [facility] Patient Information 2014 [place], [place].

## 2013-05-27 NOTE — Progress Notes (Signed)
  Subjective:    Patient ID: Tanya Wilcox, female    DOB: May 16, 1971, 42 y.o.   MRN: [id number]  HPI 42 yo female presents today with right shoulder pain. She was exercising on Monday and she hurt it. She states that it was not hurting initially after exercising. It started hurting yesterday after work. She is having discomfort when sleeping. When she moves her arm it hurts in the front. She has some limited range of motion. Only has pain with movement, no swelling, redness or heat. Used eliptical machine and now painful anterior shoulder with lifting or rotation.    Review of Systems See problem list, complex    Objective:   Physical Exam  Vitals reviewed. Constitutional: She is oriented to person, place, and time. She appears well-developed and well-nourished.  HENT:  Head: Normocephalic.  Eyes: EOM are normal.  Neck: Neck supple.  Pulmonary/Chest: Effort normal.  Musculoskeletal:       Right shoulder: She exhibits decreased range of motion, tenderness, bony tenderness, pain and spasm. She exhibits no swelling, no effusion, no crepitus, no deformity, no laceration, normal pulse and normal strength.       Arms: Tender over coracoid  Neurological: She is alert and oriented to person, place, and time. No cranial nerve deficit. She exhibits normal muscle tone. Coordination normal.  Skin: No rash noted.  Psychiatric: She has a normal mood and affect. Her behavior is normal.   [facility] reading (PRIMARY) by  Dr Perrin Maltese only tender to move, not tender to palpate at coracoid, normal xr     Bicipital tendonitis early    Assessment & Plan:  Sling/RICE/Motrin 600mg /HC for nite

## 2013-06-09 ENCOUNTER — Encounter: Payer: 59 | Attending: Family | Admitting: *Deleted

## 2013-06-09 VITALS — Ht 67.0 in | Wt 246.2 lb

## 2013-06-09 DIAGNOSIS — Z713 Dietary counseling and surveillance: Secondary | ICD-10-CM | POA: Insufficient documentation

## 2013-06-09 DIAGNOSIS — E119 Type 2 diabetes mellitus without complications: Secondary | ICD-10-CM | POA: Insufficient documentation

## 2013-06-09 NOTE — Progress Notes (Signed)
Appt start time: 1645 end time:  1715.  Assessment:  Patient was seen on  06/09/13 for individual diabetes education follow up visit. Feeling good about diabetes management for the past month. Exercising more by walking on treadmill and ecliptical machines 5 days a week right after work! Doing well with 3 Carb Meal plan and is tracking in My Fitness Pal. SMBG until she ran out of strips. Reported BG range 130 - 180 mg/dl including both pre and post meals.  Current HbA1c: 9.2%  MEDICATIONS: see list   DIETARY INTAKE:  Usual eating pattern includes 3 meals and 1-2 snacks per day.  Everyday foods include good variety of all food groups.  Avoided foods include fried foods.    24-hr recall:  B ( AM): fried egg with cheese and 1 sausage sandwich on honey wheat bread, OR boiled egg with tuna, coffee with Stevia and flavored creamer Snk ( AM): [place] pepperoni and string cheese, (sunflower seeds all day) L ( PM): brings from home; sandwich OR salad with veggies, lite dressing, flavored water Snk ( PM): yogurt if anything D ( PM): lean meat, occasionally starch, steamed vegetables, water or regular or diet soda Snk ( PM): none unless PNB Beverages: coffee, water, diet soda or regular soda  Usual physical activity: She is now exercising more by walking on treadmill and ecliptical machines 5 days a week right after work!    Estimated energy needs: 1400 calories 158 g carbohydrates 105 g protein 39 g fat  Progress Towards Goal(s):  In progress.   Nutritional Diagnosis:  NB-1.1 Food and nutrition-related knowledge deficit As related to diabetes control.  As evidenced by A1c of 9.2%.    Intervention:  Nutrition counseling provided. I commended her on her multiple behavior changes including increased activity and improved eating habits. Also discussed that muscle weighs more than fat and that with continued efforts she will start to see some weight loss. Also congratulated her on her improved BG  ranges. Including additional nutritional information on assessing her fat intake and how to count fat grams.  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

## 2013-06-09 NOTE — Patient Instructions (Signed)
Plan:  Continue to aim for 2-3 Carb Choices per meal (30-45 grams) +/- 1 either way  Consider aiming for 2 Fat Choices (10 grams) per meal for weight management Aim for 0-2 Carbs per snack if hungry  Continue reading food labels for Total Carbohydrate of foods Contunue with your activity level by using the gym at work daily as tolerated Continue checking BG at alternate times per day as directed by MD

## 2013-06-11 ENCOUNTER — Encounter: Payer: Self-pay | Admitting: *Deleted

## 2013-07-01 ENCOUNTER — Encounter: Payer: Self-pay | Admitting: Family

## 2013-07-01 ENCOUNTER — Ambulatory Visit (INDEPENDENT_AMBULATORY_CARE_PROVIDER_SITE_OTHER): Payer: 59 | Admitting: Family

## 2013-07-01 VITALS — BP 110/80 | HR 69 | Temp 97.8°F | Resp 16 | Ht 71.0 in | Wt 240.1 lb

## 2013-07-01 DIAGNOSIS — E785 Hyperlipidemia, unspecified: Secondary | ICD-10-CM

## 2013-07-01 DIAGNOSIS — E119 Type 2 diabetes mellitus without complications: Secondary | ICD-10-CM

## 2013-07-01 LAB — HEPATIC FUNCTION PANEL
ALT: 16 U/L (ref 0–35)
AST: 11 U/L (ref 0–37)
Albumin: 4.5 g/dL (ref 3.5–5.2)
Bilirubin, Direct: 0.1 mg/dL (ref 0.0–0.3)
Indirect Bilirubin: 0.2 mg/dL (ref 0.0–0.9)
Total Bilirubin: 0.3 mg/dL (ref 0.3–1.2)
Total Protein: 7.3 g/dL (ref 6.0–8.3)

## 2013-07-01 LAB — HEMOGLOBIN A1C
Hgb A1c MFr Bld: 7.4 % — ABNORMAL HIGH (ref <5.7)
Mean Plasma Glucose: 166 mg/dL — ABNORMAL HIGH (ref <117)

## 2013-07-01 LAB — LIPID PANEL
Cholesterol: 136 mg/dL (ref 0–200)
HDL: 35 mg/dL — ABNORMAL LOW (ref >39)
LDL Cholesterol: 77 mg/dL (ref 0–99)
Total CHOL/HDL Ratio: 3.9 Ratio
Triglycerides: 118 mg/dL (ref <150)
VLDL: 24 mg/dL (ref 0–40)

## 2013-07-01 LAB — BASIC METABOLIC PANEL
BUN: 15 mg/dL (ref 6–23)
Calcium: 10 mg/dL (ref 8.4–10.5)
Chloride: 101 mEq/L (ref 96–112)
Glucose, Bld: 127 mg/dL — ABNORMAL HIGH (ref 70–99)
Potassium: 4.4 mEq/L (ref 3.5–5.3)
Sodium: 134 mEq/L — ABNORMAL LOW (ref 135–145)

## 2013-07-01 MED ORDER — LISINOPRIL 2.5 MG PO TABS: 2.5000 mg | ORAL_TABLET | Freq: Every day | ORAL | Status: DC

## 2013-07-01 MED ORDER — GLUCOSE BLOOD VI STRP: 1.0000 | ORAL_STRIP | Freq: Three times a day (TID) | Status: DC

## 2013-07-01 NOTE — Patient Instructions (Signed)
Please return in 2 weeks for nurse visit for blood work (bmet) and blood pressure check (dx 250.00) Complete lab work prior to leaving. Follow up in 3 months.

## 2013-07-01 NOTE — Assessment & Plan Note (Signed)
Tolerating statin. Continue same, check lft/flp.

## 2013-07-01 NOTE — Assessment & Plan Note (Signed)
Improving.  Continue current meds.  Add ACE for renal protection (pt has mirena)

## 2013-07-01 NOTE — Progress Notes (Signed)
Subjective:    Patient ID: Tanya Wilcox, female    DOB: Jul 04, 1971, 42 y.o.   MRN: [id number]  HPI  [name] is a 42 yr old female who presents today for follow up of diabetes. Continues metformin and Januvia. Ran out of strips.  Reports fasting sugars 130, post prandial 180.   Lab Results  Component Value Date   HGBA1C 7.0* 01/20/2013    Hyperlipidemia- tolerating statin.  Denies myalgia.  Review of Systems See HPI  Past Medical History  Diagnosis Date  . Bipolar disorder   . Alcohol use   . Tobacco abuse   . Gestational diabetes     borderline II  . Osteoarthritis     knee  . Anxiety   . Depression     History   Social History  . Marital Status: Single    Spouse Name: N/A    Number of Children: N/A  . Years of Education: N/A   Occupational History  . BILLING [employer]   Social History Main Topics  . Smoking status: Current Every Day Smoker    Types: Cigarettes    Last Attempt to Quit: 01/20/2013  . Smokeless tobacco: Never Used     Comment: 1 pack cigarettes a week  . Alcohol Use: Yes     Comment: occasionally  . Drug Use: No  . Sexual Activity: Not on file   Other Topics Concern  . Not on file   Social History Narrative  . No narrative on file    Past Surgical History  Procedure Laterality Date  . Cholecystectomy  2003    Family History  Problem Relation Age of Onset  . Hypertension Mother   . Hyperlipidemia Mother   . Alcohol abuse Father     No Known Allergies  Current Outpatient Prescriptions on File Prior to Visit  Medication Sig Dispense Refill  . ALPRAZolam (XANAX) 0.25 MG tablet Take 0.25 mg by mouth 2 (two) times daily as needed for sleep.      Marland Kitchen aspirin EC 81 MG tablet Take 81 mg by mouth daily.      Marland Kitchen atorvastatin (LIPITOR) 40 MG tablet Take 1 tablet (40 mg total) by mouth daily.  90 tablet  1  . buPROPion (WELLBUTRIN XL) 300 MG 24 hr tablet Take 1 tablet (300 mg total) by mouth every morning.  30 tablet  1  .  HYDROcodone-acetaminophen (NORCO/VICODIN) 5-325 MG per tablet Take 1 tablet by mouth every 6 (six) hours as needed.  30 tablet  0  . ibuprofen (ADVIL,MOTRIN) 600 MG tablet Take 1 tablet (600 mg total) by mouth every 8 (eight) hours as needed.  30 tablet  0  . metFORMIN (GLUCOPHAGE-XR) 500 MG 24 hr tablet Take 4 tablets (2,000 mg total) by mouth daily with breakfast.  120 tablet  3  . Multiple Vitamin (MULTIVITAMIN) tablet Take 1 tablet by mouth daily.      . sitaGLIPtin (JANUVIA) 100 MG tablet Take 1 tablet (100 mg total) by mouth daily.  30 tablet  3  . valACYclovir (VALTREX) 500 MG tablet Take 1 tablet (500 mg total) by mouth daily.  90 tablet  1   No current facility-administered medications on file prior to visit.    BP 110/80  Pulse 69  Temp(Src) 97.8 F (36.6 C) (Oral)  Resp 16  Ht 5\' 11"  (1.803 m)  Wt 240 lb 1.9 oz (108.918 kg)  BMI 33.50 kg/m2  SpO2 97%  Objective:   Physical Exam  Constitutional: She is oriented to person, place, and time. She appears well-developed and well-nourished. No distress.  HENT:  Head: Normocephalic and atraumatic.  Cardiovascular: Normal rate and regular rhythm.   No murmur heard. Pulmonary/Chest: Effort normal and breath sounds normal. No respiratory distress. She has no wheezes. She has no rales. She exhibits no tenderness.  Musculoskeletal: She exhibits no edema.  Neurological: She is alert and oriented to person, place, and time.  Psychiatric: She has a normal mood and affect. Her behavior is normal. Judgment and thought content normal.          Assessment & Plan:

## 2013-07-01 NOTE — Progress Notes (Signed)
Pre visit review using our clinic review tool, if applicable. No additional management support is needed unless otherwise documented below in the visit note. 

## 2013-07-02 ENCOUNTER — Telehealth: Payer: Self-pay | Admitting: Family

## 2013-07-02 MED ORDER — CANAGLIFLOZIN 300 MG PO TABS: 1.0000 | ORAL_TABLET | Freq: Every day | ORAL | Status: DC

## 2013-07-02 NOTE — Telephone Encounter (Signed)
Sugar is above goal. Increase invokana from 100mg  to 300mg  (rx sent to [facility]). Could you pls check status of strips.  Looks like we sent on 12/10.  LDL (bad cholesterol) at goal. HDL (good cholesterol) is low- exercise can help bring this up.

## 2013-07-02 NOTE — Telephone Encounter (Signed)
T/8 one touch ultra blue  Use 1 strip 3 times daily before meals

## 2013-07-03 NOTE — Telephone Encounter (Signed)
Notified pt and she voices understanding. Pt states her glucometer strips should arrive tomorrow.

## 2013-08-07 ENCOUNTER — Ambulatory Visit (INDEPENDENT_AMBULATORY_CARE_PROVIDER_SITE_OTHER): Payer: 59 | Admitting: Family

## 2013-08-07 ENCOUNTER — Encounter: Payer: Self-pay | Admitting: Family

## 2013-08-07 VITALS — BP 124/86 | HR 71 | Temp 98.6°F | Resp 18 | Ht 71.0 in | Wt 239.0 lb

## 2013-08-07 DIAGNOSIS — E119 Type 2 diabetes mellitus without complications: Secondary | ICD-10-CM

## 2013-08-07 DIAGNOSIS — I10 Essential (primary) hypertension: Secondary | ICD-10-CM

## 2013-08-07 NOTE — Progress Notes (Signed)
Subjective:    Patient ID: Tanya Wilcox, female    DOB: [birth year], 43 y.o.   MRN: [id number]  HPI  Tanya Wilcox is a 43 yr old female who presents today for follow up of her diabetes.  Last visit she was started on invokana.    Reports that she stopped metformin 2 weeks ago due to low blood sugars.   Fasting sugars 100 or slighlty lower on metformin and invokana. Pt is fasting now.     Wt Readings from Last 3 Encounters:  08/07/13 239 lb (108.41 kg)  07/01/13 240 lb 1.9 oz (108.918 kg)  06/11/13 246 lb 3.2 oz (111.676 kg)    BP Readings from Last 3 Encounters:  08/07/13 124/86  07/01/13 110/80  05/27/13 140/92     Review of Systems See HPI  Past Medical History  Diagnosis Date  . Bipolar disorder   . Alcohol use   . Tobacco abuse   . Gestational diabetes     borderline II  . Osteoarthritis     knee  . Anxiety   . Depression     History   Social History  . Marital Status: Single    Spouse Name: N/A    Number of Children: N/A  . 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 Smoking status: Current Every Day Smoker    Types: Cigarettes    Last Attempt to Quit: 01/20/2013  . Smokeless tobacco: Never Used     Comment: 1 pack cigarettes a week  . Alcohol Use: Yes     Comment: occasionally  . Drug Use: No  . Sexual Activity: Not on file   Other Topics Concern  . Not on file   Social History Narrative  . No narrative on file    Past Surgical History  Procedure Laterality Date  . Cholecystectomy  2003    Family History  Problem Relation Age of Onset  . Hypertension Mother   . Hyperlipidemia Mother   . Alcohol abuse Father     No Known Allergies  Current Outpatient Prescriptions on File Prior to Visit  Medication Sig Dispense Refill  . ALPRAZolam (XANAX) 0.25 MG tablet Take 0.25 mg by mouth 2 (two) times daily as needed for sleep.      Marland Kitchen aspirin EC 81 MG tablet Take 81 mg by mouth  daily.      Marland Kitchen atorvastatin (LIPITOR) 40 MG tablet Take 1 tablet (40 mg total) by mouth daily.  90 tablet  1  . buPROPion (WELLBUTRIN XL) 300 MG 24 hr tablet Take 1 tablet (300 mg total) by mouth every morning.  30 tablet  1  . Canagliflozin (INVOKANA) 300 MG TABS Take 1 tablet (300 mg total) by mouth daily.  30 tablet  3  . glucose blood (ONE TOUCH TEST STRIPS) test strip 1 each by Other route 3 (three) times daily before meals.  300 each  3  . levonorgestrel (MIRENA) 20 MCG/24HR IUD 1 each by Intrauterine route once.      Marland Kitchen lisinopril (PRINIVIL,ZESTRIL) 2.5 MG tablet Take 1 tablet (2.5 mg total) by mouth daily.  90 tablet  1  . sitaGLIPtin (JANUVIA) 100 MG tablet Take 1 tablet (100 mg total) by mouth daily.  30 tablet  3  . valACYclovir (VALTREX) 500 MG tablet Take 1 tablet (500 mg total) by mouth daily.  90 tablet  1  . metFORMIN (GLUCOPHAGE-XR) 500 MG  24 hr tablet Take 4 tablets (2,000 mg total) by mouth daily with breakfast.  120 tablet  3  . Multiple Vitamin (MULTIVITAMIN) tablet Take 1 tablet by mouth daily.       No current facility-administered medications on file prior to visit.    BP 124/86  Pulse 71  Temp(Src) 98.6 F (37 C) (Oral)  Resp 18  Ht 5\' 11"  (1.803 m)  Wt 239 lb (108.41 kg)  BMI 33.35 kg/m2  SpO2 97%       Objective:   Physical Exam  Constitutional: She is oriented to person, place, and time. She appears well-developed and well-nourished. No distress.  Cardiovascular: Normal rate and regular rhythm.   No murmur heard. Pulmonary/Chest: Effort normal and breath sounds normal. No respiratory distress. She has no wheezes. She has no rales. She exhibits no tenderness.  Abdominal: Soft. Bowel sounds are normal.  Neurological: She is alert and oriented to person, place, and time.  Skin: Skin is warm and dry.  Psychiatric: She has a normal mood and affect. Her behavior is normal. Judgment and thought content normal.          Assessment & Plan:

## 2013-08-07 NOTE — Progress Notes (Signed)
Pre visit review using our clinic review tool, if applicable. No additional management support is needed unless otherwise documented below in the visit note. 

## 2013-08-07 NOTE — Patient Instructions (Addendum)
Please complete lab work prior to leaving. Resume metformin 1000mg  once daily (2 tabs). Follow up in March as scheduled, sooner if problems/concerns

## 2013-08-08 ENCOUNTER — Telehealth: Payer: Self-pay | Admitting: Family

## 2013-08-08 LAB — BASIC METABOLIC PANEL
BUN: 14 mg/dL (ref 6–23)
CALCIUM: 9.2 mg/dL (ref 8.4–10.5)
CO2: 24 meq/L (ref 19–32)
Chloride: 104 mEq/L (ref 96–112)
Creat: 0.96 mg/dL (ref 0.50–1.10)
GLUCOSE: 123 mg/dL — AB (ref 70–99)
Potassium: 4.5 mEq/L (ref 3.5–5.3)
Sodium: 135 mEq/L (ref 135–145)

## 2013-08-08 NOTE — Telephone Encounter (Signed)
Relevant patient education assigned to patient using Emmi. ° °

## 2013-08-09 ENCOUNTER — Encounter: Payer: Self-pay | Admitting: Family

## 2013-08-10 NOTE — Assessment & Plan Note (Addendum)
Recommended that pt resume metformin at 1000mg  daily instead of 2000mg  daily.  Continue invokana. Obtain follow up bmet.

## 2013-09-28 ENCOUNTER — Ambulatory Visit: Payer: 59 | Admitting: Family

## 2013-09-29 ENCOUNTER — Ambulatory Visit (INDEPENDENT_AMBULATORY_CARE_PROVIDER_SITE_OTHER): Payer: 59 | Admitting: Family

## 2013-09-29 ENCOUNTER — Encounter: Payer: Self-pay | Admitting: Family

## 2013-09-29 ENCOUNTER — Telehealth: Payer: Self-pay | Admitting: Family

## 2013-09-29 VITALS — BP 120/80 | HR 66 | Temp 98.3°F | Resp 16 | Ht 71.0 in | Wt 237.0 lb

## 2013-09-29 DIAGNOSIS — A6000 Herpesviral infection of urogenital system, unspecified: Secondary | ICD-10-CM

## 2013-09-29 DIAGNOSIS — E119 Type 2 diabetes mellitus without complications: Secondary | ICD-10-CM

## 2013-09-29 DIAGNOSIS — F319 Bipolar disorder, unspecified: Secondary | ICD-10-CM

## 2013-09-29 LAB — HEMOGLOBIN A1C
Hgb A1c MFr Bld: 7.3 % — ABNORMAL HIGH (ref <5.7)
MEAN PLASMA GLUCOSE: 163 mg/dL — AB (ref <117)

## 2013-09-29 NOTE — Progress Notes (Signed)
Pre visit review using our clinic review tool, if applicable. No additional management support is needed unless otherwise documented below in the visit note. 

## 2013-09-29 NOTE — Patient Instructions (Signed)
Please complete your lab work prior to leaving. Follow up in 3 months.   

## 2013-09-29 NOTE — Telephone Encounter (Signed)
Relevant patient education assigned to patient using Emmi. ° °

## 2013-09-29 NOTE — Progress Notes (Signed)
Subjective:    Patient ID: Tanya Wilcox, female    DOB: Aug 28, 1970, 43 y.o.   MRN: [id number]  HPI  Tanya Wilcox is a 43 yr old female who presents today for follow up.  Sugar- well controlled low 106, generally <200.Taking invokana, januvia and metformin.  Hyperlipidemia- on lipitor, denies myalgia. LDL at goal.  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   Depression- stable on wellbutrin.     Review of Systems See HPI  Past Medical History  Diagnosis Date  . Bipolar disorder   . Alcohol use   . Tobacco abuse   . Gestational diabetes     borderline II  . Osteoarthritis     knee  . Anxiety   . Depression     History   Social History  . Marital Status: Single    Spouse Name: N/A    Number of Children: N/A  . 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 Smoking status: Current Every Day Smoker    Types: Cigarettes    Last Attempt to Quit: 01/20/2013  . Smokeless tobacco: Never Used     Comment: 1 pack cigarettes a week  . Alcohol Use: Yes     Comment: occasionally  . Drug Use: No  . Sexual Activity: Not on file   Other Topics Concern  . Not on file   Social History Narrative  . No narrative on file    Past Surgical History  Procedure Laterality Date  . Cholecystectomy  2003    Family History  Problem Relation Age of Onset  . Hypertension Mother   . Hyperlipidemia Mother   . Alcohol abuse Father     No Known Allergies  Current Outpatient Prescriptions on File Prior to Visit  Medication Sig Dispense Refill  . ALPRAZolam (XANAX) 0.25 MG tablet Take 0.25 mg by mouth 2 (two) times daily as needed for sleep.      Marland Kitchen aspirin EC 81 MG tablet Take 81 mg by mouth daily.      Marland Kitchen atorvastatin (LIPITOR) 40 MG tablet Take 1 tablet (40 mg total) by mouth daily.  90 tablet  1  . buPROPion (WELLBUTRIN XL) 300 MG 24 hr tablet Take 1 tablet (300 mg total) by mouth every morning.  30 tablet  1  . Canagliflozin  (INVOKANA) 300 MG TABS Take 1 tablet (300 mg total) by mouth daily.  30 tablet  3  . glucose blood (ONE TOUCH TEST STRIPS) test strip 1 each by Other route 3 (three) times daily before meals.  300 each  3  . levonorgestrel (MIRENA) 20 MCG/24HR IUD 1 each by Intrauterine route once.      Marland Kitchen lisinopril (PRINIVIL,ZESTRIL) 2.5 MG tablet Take 1 tablet (2.5 mg total) by mouth daily.  90 tablet  1  . metFORMIN (GLUCOPHAGE-XR) 500 MG 24 hr tablet 1,000 mg.      . Multiple Vitamin (MULTIVITAMIN) tablet Take 1 tablet by mouth daily.      . sitaGLIPtin (JANUVIA) 100 MG tablet Take 1 tablet (100 mg total) by mouth daily.  30 tablet  3  . valACYclovir (VALTREX) 500 MG tablet Take 1 tablet (500 mg total) by mouth daily.  90 tablet  1   No current facility-administered medications on file prior to visit.    BP 120/80  Pulse 66  Temp(Src) 98.3 F (36.8 C) (Oral)  Resp 16  Ht 5\' 11"  (1.803  m)  Wt 237 lb (107.502 kg)  BMI 33.07 kg/m2  SpO2 99%       Objective:   Physical Exam  Constitutional: She is oriented to person, place, and time. She appears well-developed and well-nourished. No distress.  Cardiovascular: Normal rate and regular rhythm.   No murmur heard. Pulmonary/Chest: Effort normal and breath sounds normal. No respiratory distress. She has no wheezes. She has no rales. She exhibits no tenderness.  Neurological: She is alert and oriented to person, place, and time.  Psychiatric: She has a normal mood and affect. Her behavior is normal. Judgment and thought content normal.          Assessment & Plan:

## 2013-09-30 ENCOUNTER — Telehealth: Payer: Self-pay | Admitting: Family

## 2013-09-30 MED ORDER — METFORMIN HCL ER (OSM) 1000 MG PO TB24: 2000.0000 mg | ORAL_TABLET | Freq: Every day | ORAL | Status: DC

## 2013-09-30 NOTE — Telephone Encounter (Signed)
Left message for pt to return my call.

## 2013-09-30 NOTE — Telephone Encounter (Signed)
Notified pt and she voices understanding. 

## 2013-09-30 NOTE — Telephone Encounter (Signed)
Please call pt and let her know that DM remains above goal.  I would like her to increase metformin to 2000mg  once daily- I have [facility].

## 2013-10-02 ENCOUNTER — Telehealth: Payer: Self-pay

## 2013-10-02 NOTE — Telephone Encounter (Signed)
Relevant patient education assigned to patient using Emmi. ° °

## 2013-10-05 ENCOUNTER — Telehealth: Payer: Self-pay | Admitting: *Deleted

## 2013-10-05 MED ORDER — CANAGLIFLOZIN 300 MG PO TABS: 1.0000 | ORAL_TABLET | Freq: Every day | ORAL | Status: DC

## 2013-10-05 NOTE — Telephone Encounter (Signed)
Pt states she had been previously using the co-pay card at her local pharmacy. Insurance told her when she tried to refill it recently that she must go through mail order after 3 refills at local pharmacy.  Rx sent to [facility].

## 2013-10-05 NOTE — Telephone Encounter (Signed)
Received message from pt that her local pharmacy is not accepting the co-pay card for her invokana rx. She requests that we send rx to [facility]. Mail order. Attempted to reach pt to verify message and left message for pt to return my call.

## 2013-10-06 NOTE — Assessment & Plan Note (Signed)
Clinically stable on current meds. Continue same.  Obtain A1C.   

## 2013-10-06 NOTE — Assessment & Plan Note (Signed)
No breakouts on daily suppressive rx of valtrex, continue same.

## 2013-10-06 NOTE — Assessment & Plan Note (Signed)
Stable on wellbutrin, continue same.  

## 2013-10-29 ENCOUNTER — Other Ambulatory Visit: Payer: Self-pay

## 2013-11-02 ENCOUNTER — Telehealth: Payer: Self-pay | Admitting: *Deleted

## 2013-11-02 DIAGNOSIS — F319 Bipolar disorder, unspecified: Secondary | ICD-10-CM

## 2013-11-02 NOTE — Telephone Encounter (Signed)
Pt left message requesting refill of alprazolam stating seen hasn't seen psychiatry yet.  It appears pt was referred to psychiatry in 2013 and we have not filled rx for pt before.  Left message on pt's cell# to call and let us know when she last saw psychiatry and when is next appt.

## 2013-11-02 NOTE — Telephone Encounter (Signed)
Pt called back and left message that she has not seen her therapist in 1 year because her symptoms have been well controlled and she states she has not even needed the alprazolam until recently due to increased stress. Pt is requesting a 30 day supply of alprazolam and states if she feels she will need to continue the medication beyond that she will schedule an appt with her therapist.  Please advise.

## 2013-11-03 MED ORDER — ALPRAZOLAM 0.25 MG PO TABS: 0.2500 mg | ORAL_TABLET | Freq: Two times a day (BID) | ORAL | Status: DC | PRN

## 2013-11-03 NOTE — Telephone Encounter (Signed)
Notified pt and she voices understanding.  Pt declines our referral and states she will contact current psychiatrist office. She sees Dr Donzetta Sprung NP. Refill faxed and pt aware.

## 2013-11-03 NOTE — Telephone Encounter (Signed)
I will fill one time.  See rx.  I recommend that she establish with psychiatry for ongoing management. Referral has been place.

## 2013-12-10 ENCOUNTER — Other Ambulatory Visit: Payer: Self-pay | Admitting: Family

## 2013-12-10 NOTE — Telephone Encounter (Addendum)
  Medication Detail      Disp Refills Start End     atorvastatin (LIPITOR) 40 MG tablet 90 tablet 1 09/17/2012     Sig - Route: Take 1 tablet (40 mg total) by mouth daily. - Oral    E-Prescribing Status: Receipt confirmed by pharmacy (09/17/2012 4:34 PM EST)    Pharmacy    [place], [place]   Rx request for Atorvastatin Denied-Too Soon for request/SLS

## 2013-12-11 ENCOUNTER — Telehealth: Payer: Self-pay | Admitting: *Deleted

## 2013-12-11 MED ORDER — ALPRAZOLAM 0.25 MG PO TABS
0.2500 mg | ORAL_TABLET | Freq: Two times a day (BID) | ORAL | Status: DC | PRN
Start: 2013-12-11 — End: 2015-08-19

## 2013-12-11 MED ORDER — ATORVASTATIN CALCIUM 40 MG PO TABS: 40.0000 mg | ORAL_TABLET | Freq: Every day | ORAL | Status: DC

## 2013-12-11 NOTE — Telephone Encounter (Signed)
Rx was signed and faxed to pharmacy.

## 2013-12-11 NOTE — Telephone Encounter (Signed)
Will fill until she establishes.

## 2013-12-11 NOTE — Telephone Encounter (Signed)
Spoke with pt. She requests that alprazolam rx be sent to [facility].  Also states [facility] told her we denied her atorvastatin refill. Upon review of EPIC I advised her Rx was denied in error and I would send refill. Rx sent as last refill by [place] was 09/17/12. Alprazolam rx printed and forwarded to Provider for signature.

## 2013-12-11 NOTE — Telephone Encounter (Signed)
Received message from pt that her previous psychiatrist has retired. Has appt with new Provider on 02/09/14 and wants to know if we will refill xanax until then? Last refill by [place] 11/03/13, #30.  Please advise.

## 2013-12-30 ENCOUNTER — Ambulatory Visit (INDEPENDENT_AMBULATORY_CARE_PROVIDER_SITE_OTHER): Payer: 59 | Admitting: Family

## 2013-12-30 ENCOUNTER — Telehealth: Payer: Self-pay | Admitting: Family

## 2013-12-30 ENCOUNTER — Encounter: Payer: Self-pay | Admitting: Family

## 2013-12-30 VITALS — BP 124/88 | HR 70 | Temp 98.3°F | Resp 16 | Ht 71.0 in | Wt 235.1 lb

## 2013-12-30 DIAGNOSIS — E785 Hyperlipidemia, unspecified: Secondary | ICD-10-CM

## 2013-12-30 DIAGNOSIS — I10 Essential (primary) hypertension: Secondary | ICD-10-CM | POA: Insufficient documentation

## 2013-12-30 DIAGNOSIS — E119 Type 2 diabetes mellitus without complications: Secondary | ICD-10-CM

## 2013-12-30 NOTE — Progress Notes (Signed)
Subjective:    Patient ID: Tanya Wilcox, female    DOB: 07/17/1971, 43 y.o.   MRN: [id number]  HPI  Tanya Wilcox is a 43 yr old female who presents today for follow up of multiple medical problems.  1) DM2- maintained on januvia, metformin, invokana. Admits to not taking metformin. Denies hypoglycemia.  Reports that her sugars have been in the high 180's generally.Eye exam in July.     Lab Results  Component Value Date   HGBA1C 7.3* 09/29/2013  Metformin dose was increased last visit.   2) HTN- on lisinopril. Denies CP/SOB or swelling.  BP Readings from Last 3 Encounters:  12/30/13 124/88  09/29/13 120/80  08/07/13 124/86   3) Hyperlipidemia-denies mylagia. On atorvastatin.  Lab Results  Component Value Date   CHOL 136 07/01/2013   HDL 35* 07/01/2013   LDLCALC 77 07/01/2013   TRIG 118 07/01/2013   CHOLHDL 3.9 07/01/2013    Review of Systems See HPI  Past Medical History  Diagnosis Date  . Bipolar disorder   . Alcohol use   . Tobacco abuse   . Gestational diabetes     borderline II  . Osteoarthritis     knee  . Anxiety   . Depression     History   Social History  . Marital Status: Single    Spouse Name: N/A    Number of Children: N/A  . 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 Smoking status: Current Every Day Smoker    Types: Cigarettes    Last Attempt to Quit: 01/20/2013  . Smokeless tobacco: Never Used     Comment: 1 pack cigarettes a week  . Alcohol Use: Yes     Comment: occasionally  . Drug Use: No  . Sexual Activity: Not on file   Other Topics Concern  . Not on file   Social History Narrative  . No narrative on file    Past Surgical History  Procedure Laterality Date  . Cholecystectomy  2003    Family History  Problem Relation Age of Onset  . Hypertension Mother   . Hyperlipidemia Mother   . Alcohol abuse Father     No Known Allergies  Current Outpatient Prescriptions  on File Prior to Visit  Medication Sig Dispense Refill  . ALPRAZolam (XANAX) 0.25 MG tablet Take 1 tablet (0.25 mg total) by mouth 2 (two) times daily as needed for anxiety.  30 tablet  0  . aspirin EC 81 MG tablet Take 81 mg by mouth daily.      Marland Kitchen atorvastatin (LIPITOR) 40 MG tablet Take 1 tablet (40 mg total) by mouth daily.  90 tablet  1  . buPROPion (WELLBUTRIN XL) 300 MG 24 hr tablet Take 1 tablet (300 mg total) by mouth every morning.  30 tablet  1  . Canagliflozin (INVOKANA) 300 MG TABS Take 1 tablet (300 mg total) by mouth daily.  90 tablet  1  . glucose blood (ONE TOUCH TEST STRIPS) test strip 1 each by Other route 3 (three) times daily before meals.  300 each  3  . JANUVIA 100 MG tablet Take 1 tablet by mouth  daily  90 tablet  0  . levonorgestrel (MIRENA) 20 MCG/24HR IUD 1 each by Intrauterine route once.      Marland Kitchen lisinopril (PRINIVIL,ZESTRIL) 2.5 MG tablet Take 1 tablet (2.5 mg total) by mouth daily.  90 tablet  1  . metformin (FORTAMET) 1000 MG (OSM) 24 hr tablet Take 2 tablets (2,000 mg total) by mouth daily with breakfast.  60 tablet  3  . Multiple Vitamin (MULTIVITAMIN) tablet Take 1 tablet by mouth daily.      . valACYclovir (VALTREX) 500 MG tablet Take 1 tablet every day  90 tablet  0   No current facility-administered medications on file prior to visit.    BP 124/88  Pulse 70  Temp(Src) 98.3 F (36.8 C) (Oral)  Resp 16  Ht 5\' 11"  (1.803 m)  Wt 235 lb 1.9 oz (106.65 kg)  BMI 32.81 kg/m2  SpO2 99%       Objective:   Physical Exam  Constitutional: She is oriented to person, place, and time. She appears well-developed and well-nourished. No distress.  HENT:  Head: Normocephalic and atraumatic.  Cardiovascular: Normal rate and regular rhythm.   No murmur heard. Pulmonary/Chest: Effort normal and breath sounds normal. No respiratory distress. She has no wheezes. She has no rales. She exhibits no tenderness.  Neurological: She is alert and oriented to person, place,  and time.  Skin: Skin is warm and dry.  Psychiatric: Judgment and thought content normal.  Very serious, almost angry affect.          Assessment & Plan:

## 2013-12-30 NOTE — Assessment & Plan Note (Signed)
Stable on current meds.  Obtain A1C, bmet. We discussed importance of compliance with metformin.

## 2013-12-30 NOTE — Patient Instructions (Signed)
Please complete lab work prior to leaving.   Please schedule a follow up appointment in 3 months.  

## 2013-12-30 NOTE — Assessment & Plan Note (Addendum)
LDL at goal on atorvastatin. Continue same, obtain follow up lfts.

## 2013-12-30 NOTE — Telephone Encounter (Signed)
See my chart message

## 2013-12-30 NOTE — Progress Notes (Signed)
Pre visit review using our clinic review tool, if applicable. No additional management support is needed unless otherwise documented below in the visit note. 

## 2013-12-30 NOTE — Assessment & Plan Note (Signed)
BP stable. Continue lisinopril. Obtain bmet.

## 2014-01-01 ENCOUNTER — Telehealth: Payer: Self-pay | Admitting: Family

## 2014-01-01 NOTE — Telephone Encounter (Signed)
Opened in error

## 2014-01-28 ENCOUNTER — Other Ambulatory Visit: Payer: Self-pay | Admitting: Family

## 2014-03-30 ENCOUNTER — Ambulatory Visit: Payer: 59 | Admitting: Family

## 2014-05-07 ENCOUNTER — Other Ambulatory Visit: Payer: Self-pay

## 2014-05-11 ENCOUNTER — Other Ambulatory Visit: Payer: Self-pay | Admitting: Family

## 2014-05-12 ENCOUNTER — Telehealth: Payer: Self-pay | Admitting: *Deleted

## 2014-05-12 NOTE — Telephone Encounter (Signed)
PA for Invokana 300 mg tab submitted to [facility]. Awaiting determination. JG//CMA

## 2014-05-14 ENCOUNTER — Ambulatory Visit (INDEPENDENT_AMBULATORY_CARE_PROVIDER_SITE_OTHER): Payer: 59 | Admitting: Family

## 2014-05-14 ENCOUNTER — Encounter: Payer: Self-pay | Admitting: Family

## 2014-05-14 VITALS — BP 160/100 | HR 82 | Temp 98.2°F | Resp 16 | Ht 71.0 in | Wt 245.8 lb

## 2014-05-14 DIAGNOSIS — Z23 Encounter for immunization: Secondary | ICD-10-CM

## 2014-05-14 DIAGNOSIS — E785 Hyperlipidemia, unspecified: Secondary | ICD-10-CM

## 2014-05-14 DIAGNOSIS — I10 Essential (primary) hypertension: Secondary | ICD-10-CM

## 2014-05-14 DIAGNOSIS — IMO0002 Reserved for concepts with insufficient information to code with codable children: Secondary | ICD-10-CM

## 2014-05-14 DIAGNOSIS — E1165 Type 2 diabetes mellitus with hyperglycemia: Secondary | ICD-10-CM

## 2014-05-14 MED ORDER — VALACYCLOVIR HCL 500 MG PO TABS: ORAL_TABLET | ORAL | Status: DC

## 2014-05-14 MED ORDER — LISINOPRIL 20 MG PO TABS: 20.0000 mg | ORAL_TABLET | Freq: Every day | ORAL | Status: DC

## 2014-05-14 MED ORDER — METFORMIN HCL ER 500 MG PO TB24: 500.0000 mg | ORAL_TABLET | Freq: Every day | ORAL | Status: DC

## 2014-05-14 NOTE — Assessment & Plan Note (Signed)
Resume metformin,continue [place] and invokana. Check a1c.

## 2014-05-14 NOTE — Progress Notes (Signed)
Subjective:    Patient ID: Tanya Wilcox, female    DOB: Jul 07, 1971, 43 y.o.   MRN: [id number]  HPI  Tanya Wilcox is a 43 yr old female who presents today for follow up of multiple medical problems:  1) Hyperlipidemia- maintained on lipitor.   2) DM2- Lab Results  Component Value Date   HGBA1C 7.3* 09/29/2013   HGBA1C 7.4* 07/01/2013   HGBA1C 7.0* 01/20/2013   Lab Results  Component Value Date   MICROALBUR 0.58 09/15/2012   LDLCALC 77 07/01/2013   CREATININE 0.96 08/07/2013   3) HTN- maintained on lisinopril 2.5mg  once daily.   BP Readings from Last 3 Encounters:  05/14/14 150/88  12/30/13 124/88  09/29/13 120/80      Review of Systems See HPI  Past Medical History  Diagnosis Date  . Bipolar disorder   . Alcohol use   . Tobacco abuse   . Gestational diabetes     borderline II  . Osteoarthritis     knee  . Anxiety   . Depression     History   Social History  . Marital Status: Single    Spouse Name: N/A    Number of Children: N/A  . 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 Smoking status: Current Every Day Smoker    Types: Cigarettes    Last Attempt to Quit: 01/20/2013  . Smokeless tobacco: Never Used     Comment: 1 pack cigarettes a week  . Alcohol Use: Yes     Comment: occasionally  . Drug Use: No  . Sexual Activity: Not on file   Other Topics Concern  . Not on file   Social History Narrative  . No narrative on file    Past Surgical History  Procedure Laterality Date  . Cholecystectomy  2003    Family History  Problem Relation Age of Onset  . Hypertension Mother   . Hyperlipidemia Mother   . Alcohol abuse Father     No Known Allergies  Current Outpatient Prescriptions on File Prior to Visit  Medication Sig Dispense Refill  . ALPRAZolam (XANAX) 0.25 MG tablet Take 1 tablet (0.25 mg total) by mouth 2 (two) times daily as needed for anxiety.  30 tablet  0  . aspirin EC 81 MG  tablet Take 81 mg by mouth daily.      Marland Kitchen atorvastatin (LIPITOR) 40 MG tablet Take 1 tablet (40 mg total) by mouth daily.  90 tablet  1  . buPROPion (WELLBUTRIN XL) 300 MG 24 hr tablet Take 1 tablet (300 mg total) by mouth every morning.  30 tablet  1  . Canagliflozin (INVOKANA) 300 MG TABS Take 1 tablet (300 mg total) by mouth daily.  90 tablet  1  . glucose blood (ONE TOUCH TEST STRIPS) test strip 1 each by Other route 3 (three) times daily before meals.  300 each  3  . JANUVIA 100 MG tablet Take 1 tablet by mouth  daily  90 tablet  0  . levonorgestrel (MIRENA) 20 MCG/24HR IUD 1 each by Intrauterine route once.      . Multiple Vitamin (MULTIVITAMIN) tablet Take 1 tablet by mouth daily.       No current facility-administered medications on file prior to visit.    BP 160/100  Pulse 82  Temp(Src) 98.2 F (36.8 C) (Oral)  Resp 16  Ht 5\' 11"  (1.803 m)  Wt 245  lb 12.8 oz (111.494 kg)  BMI 34.30 kg/m2  SpO2 98%       Objective:   Physical Exam  Constitutional: She is oriented to person, place, and time. She appears well-developed and well-nourished. No distress.  HENT:  Head: Normocephalic and atraumatic.  Cardiovascular: Normal rate and regular rhythm.   No murmur heard. Pulmonary/Chest: Effort normal and breath sounds normal. No respiratory distress. She has no wheezes. She has no rales. She exhibits no tenderness.  Neurological: She is alert and oriented to person, place, and time.  Skin: Skin is warm and dry.  Psychiatric: She has a normal mood and affect. Her behavior is normal. Judgment and thought content normal.          Assessment & Plan:  in

## 2014-05-14 NOTE — Telephone Encounter (Signed)
Spoke with insurance and they stated they will fax additional information. JG//CMA

## 2014-05-14 NOTE — Patient Instructions (Addendum)
Restart metformin. Increase lisinopril to 20mg  once daily. Follow up in 2 weeks.

## 2014-05-14 NOTE — Assessment & Plan Note (Signed)
Continue statin, flp next visit.

## 2014-05-14 NOTE — Telephone Encounter (Signed)
Pt also requests that we get approval / override from insurance to get below Rx at local pharmacy as the cost is cheaper for her than using mail order option.    Jessica--can you contact her insurance and see what we need to do to get this override?

## 2014-05-14 NOTE — Progress Notes (Signed)
Pre visit review using our clinic review tool, if applicable. No additional management support is needed unless otherwise documented below in the visit note. 

## 2014-05-14 NOTE — Assessment & Plan Note (Signed)
Uncontrolled. Increase lisinopril to 20mg . Plan bmet next visit.

## 2014-05-17 ENCOUNTER — Telehealth: Payer: Self-pay | Admitting: Family

## 2014-05-17 NOTE — Telephone Encounter (Signed)
emmi emailed °

## 2014-05-28 ENCOUNTER — Encounter: Payer: Self-pay | Admitting: Family

## 2014-05-28 ENCOUNTER — Ambulatory Visit (INDEPENDENT_AMBULATORY_CARE_PROVIDER_SITE_OTHER): Payer: 59 | Admitting: Family

## 2014-05-28 VITALS — BP 124/86 | HR 85 | Temp 98.7°F | Resp 18 | Ht 71.0 in | Wt 242.2 lb

## 2014-05-28 DIAGNOSIS — E1165 Type 2 diabetes mellitus with hyperglycemia: Secondary | ICD-10-CM

## 2014-05-28 DIAGNOSIS — IMO0002 Reserved for concepts with insufficient information to code with codable children: Secondary | ICD-10-CM

## 2014-05-28 DIAGNOSIS — E118 Type 2 diabetes mellitus with unspecified complications: Secondary | ICD-10-CM

## 2014-05-28 DIAGNOSIS — I10 Essential (primary) hypertension: Secondary | ICD-10-CM

## 2014-05-28 LAB — BASIC METABOLIC PANEL
BUN: 10 mg/dL (ref 6–23)
CO2: 28 mEq/L (ref 19–32)
CREATININE: 0.9 mg/dL (ref 0.4–1.2)
Calcium: 9.2 mg/dL (ref 8.4–10.5)
Chloride: 103 mEq/L (ref 96–112)
GFR: 88.94 mL/min (ref >60.00)
Glucose, Bld: 269 mg/dL — ABNORMAL HIGH (ref 70–99)
POTASSIUM: 4.4 meq/L (ref 3.5–5.1)
Sodium: 135 mEq/L (ref 135–145)

## 2014-05-28 LAB — HEMOGLOBIN A1C: Hgb A1c MFr Bld: 9.9 % — ABNORMAL HIGH (ref 4.6–6.5)

## 2014-05-28 NOTE — Addendum Note (Signed)
Addended by: Harl Bowie on: 05/28/2014 08:21 AM   Modules accepted: Orders

## 2014-05-28 NOTE — Assessment & Plan Note (Signed)
Plans to start [place].

## 2014-05-28 NOTE — Assessment & Plan Note (Signed)
BP has improved. Will get BMET today. Follow up in 3 months for chronic conditions.

## 2014-05-28 NOTE — Progress Notes (Signed)
   Subjective:    Patient ID: Tanya Wilcox, female    DOB: 1971/04/06, 43 y.o.   MRN: [id number]  HPI  [name] is a 43 yr old female here for a 2 week FU for HTN.   1)HTN- Last visit the lisinopril was upped to 20 mg. She is taking lisinopril consistently. She reports no adverse effects. Eye exam is not up to date. Diet and exercise have not changed since previous visit.   2) DM 2- Discussed with Debbrah Alar NP. Reports that she is taking [place] and metformin. Sugars have been running high. Notes that the price of invokana has come down and she will start today.   Will get A1C, BMET, Lipids non-fasting today   Review of Systems  Eyes: Negative for visual disturbance.  Respiratory: Positive for cough.        Tickle in throat causing minor cough per pt  Cardiovascular: Negative for chest pain, palpitations and leg swelling.  Gastrointestinal: Negative for abdominal pain.  Musculoskeletal: Negative for arthralgias.  Neurological: Negative for dizziness, syncope, weakness and headaches.  Psychiatric/Behavioral:       Denies anxiety or depression today   BP Readings from Last 3 Encounters:  05/28/14 124/86  05/14/14 160/100  12/30/13 124/88       Objective:   Physical Exam  Constitutional: She is oriented to person, place, and time.  Eyes: EOM are normal. Pupils are equal, round, and reactive to light. Right eye exhibits no discharge. Left eye exhibits no discharge. No scleral icterus.  Cardiovascular: Normal rate, regular rhythm, normal heart sounds and intact distal pulses.  Exam reveals no gallop and no friction rub.   No murmur heard. Pulmonary/Chest: Effort normal and breath sounds normal.  Neurological: She is alert and oriented to person, place, and time.  Psychiatric: She has a normal mood and affect. Her behavior is normal. Judgment and thought content normal.          Assessment & Plan:  Patient seen along with Lorane Gell for training purposes. I have  personally seen and examined and agree with Lorane Gell' assessment and plan. Plan lipids next visit fasting.  Debbrah Alar NP

## 2014-05-28 NOTE — Progress Notes (Signed)
Pre visit review using our clinic review tool, if applicable. No additional management support is needed unless otherwise documented below in the visit note. 

## 2014-05-28 NOTE — Patient Instructions (Signed)
Please complete lab work prior to leaving. Follow up in 3 months.  

## 2014-05-31 ENCOUNTER — Telehealth: Payer: Self-pay | Admitting: Family

## 2014-05-31 MED ORDER — METFORMIN HCL ER 500 MG PO TB24: 2000.0000 mg | ORAL_TABLET | Freq: Every day | ORAL | Status: DC

## 2014-05-31 NOTE — Telephone Encounter (Signed)
Diabetes is very uncontrolled.  Increase metformin to 2000mg  (4 tabs) once daily.  Start invokana as we discussed.

## 2014-06-01 NOTE — Telephone Encounter (Signed)
Notified pt and she voices understanding. 

## 2014-07-09 ENCOUNTER — Other Ambulatory Visit: Payer: Self-pay | Admitting: Family

## 2014-08-03 ENCOUNTER — Other Ambulatory Visit: Payer: Self-pay | Admitting: Family

## 2014-08-18 ENCOUNTER — Other Ambulatory Visit: Payer: Self-pay | Admitting: Family

## 2014-08-23 ENCOUNTER — Telehealth: Payer: Self-pay | Admitting: *Deleted

## 2014-08-23 MED ORDER — LISINOPRIL 20 MG PO TABS: 20.0000 mg | ORAL_TABLET | Freq: Every day | ORAL | Status: DC

## 2014-08-23 NOTE — Telephone Encounter (Signed)
Received fax from [facility] for lisinopril. Refill sent.

## 2014-08-30 ENCOUNTER — Ambulatory Visit (INDEPENDENT_AMBULATORY_CARE_PROVIDER_SITE_OTHER): Payer: 59 | Admitting: Family

## 2014-08-30 ENCOUNTER — Ambulatory Visit: Payer: 59 | Admitting: Family

## 2014-08-30 ENCOUNTER — Encounter: Payer: Self-pay | Admitting: Family

## 2014-08-30 VITALS — BP 108/80 | HR 71 | Temp 98.2°F | Resp 16 | Ht 71.0 in | Wt 241.0 lb

## 2014-08-30 DIAGNOSIS — IMO0002 Reserved for concepts with insufficient information to code with codable children: Secondary | ICD-10-CM

## 2014-08-30 DIAGNOSIS — Z0000 Encounter for general adult medical examination without abnormal findings: Secondary | ICD-10-CM

## 2014-08-30 DIAGNOSIS — E785 Hyperlipidemia, unspecified: Secondary | ICD-10-CM

## 2014-08-30 DIAGNOSIS — E1165 Type 2 diabetes mellitus with hyperglycemia: Secondary | ICD-10-CM

## 2014-08-30 DIAGNOSIS — I10 Essential (primary) hypertension: Secondary | ICD-10-CM

## 2014-08-30 LAB — HEMOGLOBIN A1C: HEMOGLOBIN A1C: 9.8 % — AB (ref 4.6–6.5)

## 2014-08-30 LAB — BASIC METABOLIC PANEL
BUN: 15 mg/dL (ref 6–23)
CHLORIDE: 104 meq/L (ref 96–112)
CO2: 22 mEq/L (ref 19–32)
CREATININE: 0.97 mg/dL (ref 0.40–1.20)
Calcium: 9.4 mg/dL (ref 8.4–10.5)
GFR: 80.44 mL/min (ref >60.00)
Glucose, Bld: 161 mg/dL — ABNORMAL HIGH (ref 70–99)
POTASSIUM: 4.2 meq/L (ref 3.5–5.1)
Sodium: 137 mEq/L (ref 135–145)

## 2014-08-30 LAB — LIPID PANEL
Cholesterol: 113 mg/dL (ref 0–200)
HDL: 28.6 mg/dL — AB (ref >39.00)
LDL Cholesterol: 63 mg/dL (ref 0–99)
NonHDL: 84.4
TRIGLYCERIDES: 107 mg/dL (ref 0.0–149.0)
Total CHOL/HDL Ratio: 4
VLDL: 21.4 mg/dL (ref 0.0–40.0)

## 2014-08-30 MED ORDER — LISINOPRIL 20 MG PO TABS: 20.0000 mg | ORAL_TABLET | Freq: Every day | ORAL | Status: DC

## 2014-08-30 MED ORDER — OMEPRAZOLE 40 MG PO CPDR: 40.0000 mg | DELAYED_RELEASE_CAPSULE | Freq: Every day | ORAL | Status: DC

## 2014-08-30 MED ORDER — FLUCONAZOLE 150 MG PO TABS: ORAL_TABLET | ORAL | Status: DC

## 2014-08-30 NOTE — Progress Notes (Signed)
Pre visit review using our clinic review tool, if applicable. No additional management support is needed unless otherwise documented below in the visit note. 

## 2014-08-30 NOTE — Addendum Note (Signed)
Addended by: Peggyann Shoals on: 08/30/2014 07:48 AM   Modules accepted: Orders

## 2014-08-30 NOTE — Assessment & Plan Note (Signed)
Lab Results  Component Value Date   CHOL 136 07/01/2013   HDL 35* 07/01/2013   LDLCALC 77 07/01/2013   TRIG 118 07/01/2013   CHOLHDL 3.9 07/01/2013

## 2014-08-30 NOTE — Patient Instructions (Signed)
Please complete lab work prior to leaving. Start omeprazole once daily for reflux. You may use diflucan as needed for yeast infection. Try to add exercise with a goal of 30 min five days a week. Continue your work with exercise. Follow up in 3 months.

## 2014-08-30 NOTE — Progress Notes (Signed)
Subjective:    Patient ID: Tanya Wilcox, female    DOB: [birth year], 44 y.o.   MRN: [id number]  HPI Tanya Wilcox is here today for follow up of DM2 and HTN.  DM:  Invokana 300 mg was strarted at last visit in November 2015.  Fasting BS: 140-170 2 hour Post-prandial BS: 160-180 Diet: doing better with diet. Avoids sweets and white carbs. Exercise: reports none. 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 Reports compliance with invokana, januvia. Not taking metformin due to nausea. Denies episodes of hypoglycemia. Lab Results  Component Value Date   HGBA1C 9.9* 05/28/2014   2. HYPERTENSION: Reports compliance with lisinopril. Reports headaches 3 times per week for the last few weeks. They are mostly over whole head around top of head. Denies nausea, sensitivity to light, or aura. Denies lower extremity edema. Reports pressure in chest through January-has improved in last week. Felt like lump in chest that worsened when she ate and drank. She thinks it may be related to reflux. Also reports getting more SOB than usual when walking up steps.  BP Readings from Last 3 Encounters:  08/30/14 98/62  05/28/14 124/86  05/14/14 160/100   3. Hyperlipidemia: Reports compliance with  Atorvastatin. Denies myalgia.     Review of Systems    see HPI  Past Medical History  Diagnosis Date  . Bipolar disorder   . Alcohol use   . Tobacco abuse   . Gestational diabetes     borderline II  . Osteoarthritis     knee  . Anxiety   . Depression     History   Social History  . Marital Status: Single    Spouse Name: N/A    Number of Children: N/A  . 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 Smoking status: Current Every Day Smoker    Types: Cigarettes    Last Attempt to Quit: 01/20/2013  . Smokeless tobacco: Never Used     Comment: 1 pack cigarettes a week  . Alcohol Use: Yes     Comment: occasionally    . Drug Use: No  . Sexual Activity: Not on file   Other Topics Concern  . Not on file   Social History Narrative    Past Surgical History  Procedure Laterality Date  . Cholecystectomy  2003    Family History  Problem Relation Age of Onset  . Hypertension Mother   . Hyperlipidemia Mother   . Alcohol abuse Father     No Known Allergies  Current Outpatient Prescriptions on File Prior to Visit  Medication Sig Dispense Refill  . ALPRAZolam (XANAX) 0.25 MG tablet Take 1 tablet (0.25 mg total) by mouth 2 (two) times daily as needed for anxiety. 30 tablet 0  . aspirin EC 81 MG tablet Take 81 mg by mouth daily.    Marland Kitchen atorvastatin (LIPITOR) 40 MG tablet Take 1 tablet by mouth  daily 90 tablet 0  . buPROPion (WELLBUTRIN XL) 300 MG 24 hr tablet Take 1 tablet (300 mg total) by mouth every morning. 30 tablet 1  . glucose blood (ONE TOUCH TEST STRIPS) test strip 1 each by Other route 3 (three) times daily before meals. 300 each 3  . INVOKANA 300 MG TABS tablet Take 1 tablet by mouth  daily 90 tablet 0  . JANUVIA 100 MG tablet Take 1 tablet by mouth  daily 90 tablet 0  .  LATUDA 40 MG TABS tablet Take 40 mg by mouth daily.    Marland Kitchen levonorgestrel (MIRENA) 20 MCG/24HR IUD 1 each by Intrauterine route once.    . metFORMIN (GLUCOPHAGE XR) 500 MG 24 hr tablet Take 4 tablets (2,000 mg total) by mouth daily with breakfast. 120 tablet 2  . Multiple Vitamin (MULTIVITAMIN) tablet Take 1 tablet by mouth daily.    . valACYclovir (VALTREX) 500 MG tablet Take 1 tablet by mouth  every day 90 tablet 0   No current facility-administered medications on file prior to visit.    BP 108/80 mmHg  Pulse 71  Temp(Src) 98.2 F (36.8 C) (Oral)  Resp 16  Ht 5\' 11"  (1.803 m)  Wt 241 lb (109.317 kg)  BMI 33.63 kg/m2  SpO2 99%    Objective:   Physical Exam  Constitutional: She appears well-developed and well-nourished.  Cardiovascular: Normal rate, regular rhythm and normal heart sounds.   No murmur  heard. Pulmonary/Chest: Effort normal and breath sounds normal. No respiratory distress. She has no wheezes.  Psychiatric: Her behavior is normal. Judgment and thought content normal.  Flat affect          Assessment & Plan:

## 2014-08-30 NOTE — Assessment & Plan Note (Signed)
Pt has been back on meds x 1 month.  Expect A1C to be high. We discussed that new finding of retinopathy shows need for strict glucose control. If A1C above goal in 3 months, we discussed adding insulin.

## 2014-08-30 NOTE — Assessment & Plan Note (Signed)
BP stable on current meds continue same, obtain a1c.

## 2014-08-31 ENCOUNTER — Encounter: Payer: Self-pay | Admitting: Family

## 2014-10-11 ENCOUNTER — Other Ambulatory Visit: Payer: Self-pay | Admitting: Obstetrics and Gynecology

## 2014-10-13 LAB — CYTOLOGY - PAP

## 2014-10-18 ENCOUNTER — Other Ambulatory Visit: Payer: Self-pay | Admitting: Family

## 2014-10-18 NOTE — Telephone Encounter (Signed)
Medication Detail      Disp Refills Start End     omeprazole (PRILOSEC) 40 MG capsule 30 capsule 3 08/30/2014     Sig - Route: Take 1 capsule (40 mg total) by mouth daily. - Oral    E-Prescribing Status: Receipt confirmed by pharmacy (08/30/2014 7:36 AM EST)      Rx request to mail order pharmacy/[facility]

## 2014-10-24 ENCOUNTER — Other Ambulatory Visit: Payer: Self-pay | Admitting: Family

## 2014-10-25 NOTE — Telephone Encounter (Signed)
Pt returned my call and states she had requested Janumet in error and is not needing this now.  Denial sent to pharmacy.

## 2014-10-25 NOTE — Telephone Encounter (Signed)
Need to call pt for verification of Janumet request. Pt currently has metformin and [place] on med list.

## 2014-10-25 NOTE — Telephone Encounter (Signed)
Left detailed message on pt's cell# to call and verify Janumet request. Is pt aware that mail order is requesting Rx in combo med instead of separately?

## 2014-12-06 ENCOUNTER — Ambulatory Visit: Payer: 59 | Admitting: Family

## 2014-12-07 ENCOUNTER — Ambulatory Visit (INDEPENDENT_AMBULATORY_CARE_PROVIDER_SITE_OTHER): Payer: 59 | Admitting: Family

## 2014-12-07 ENCOUNTER — Telehealth: Payer: Self-pay | Admitting: *Deleted

## 2014-12-07 ENCOUNTER — Encounter: Payer: Self-pay | Admitting: Family

## 2014-12-07 VITALS — BP 118/78 | HR 77 | Temp 98.0°F | Resp 16 | Ht 71.0 in | Wt 229.0 lb

## 2014-12-07 DIAGNOSIS — IMO0002 Reserved for concepts with insufficient information to code with codable children: Secondary | ICD-10-CM

## 2014-12-07 DIAGNOSIS — E1165 Type 2 diabetes mellitus with hyperglycemia: Secondary | ICD-10-CM

## 2014-12-07 DIAGNOSIS — E119 Type 2 diabetes mellitus without complications: Secondary | ICD-10-CM

## 2014-12-07 DIAGNOSIS — F319 Bipolar disorder, unspecified: Secondary | ICD-10-CM

## 2014-12-07 DIAGNOSIS — E871 Hypo-osmolality and hyponatremia: Secondary | ICD-10-CM

## 2014-12-07 DIAGNOSIS — I10 Essential (primary) hypertension: Secondary | ICD-10-CM

## 2014-12-07 DIAGNOSIS — H9190 Unspecified hearing loss, unspecified ear: Secondary | ICD-10-CM | POA: Diagnosis not present

## 2014-12-07 DIAGNOSIS — E785 Hyperlipidemia, unspecified: Secondary | ICD-10-CM

## 2014-12-07 DIAGNOSIS — H9193 Unspecified hearing loss, bilateral: Secondary | ICD-10-CM

## 2014-12-07 LAB — HEMOGLOBIN A1C: HEMOGLOBIN A1C: 7 % — AB (ref 4.6–6.5)

## 2014-12-07 LAB — BASIC METABOLIC PANEL
BUN: 15 mg/dL (ref 6–23)
CALCIUM: 10.2 mg/dL (ref 8.4–10.5)
CO2: 23 mEq/L (ref 19–32)
CREATININE: 0.9 mg/dL (ref 0.40–1.20)
Chloride: 102 mEq/L (ref 96–112)
GFR: 87.59 mL/min (ref >60.00)
GLUCOSE: 122 mg/dL — AB (ref 70–99)
Potassium: 4.1 mEq/L (ref 3.5–5.1)
Sodium: 133 mEq/L — ABNORMAL LOW (ref 135–145)

## 2014-12-07 LAB — MICROALBUMIN / CREATININE URINE RATIO
Creatinine,U: 74.5 mg/dL
MICROALB/CREAT RATIO: 0.9 mg/g (ref 0.0–30.0)
Microalb, Ur: <0.7 mg/dL (ref 0.0–1.9)

## 2014-12-07 MED ORDER — ATORVASTATIN CALCIUM 40 MG PO TABS: ORAL_TABLET | ORAL | Status: DC

## 2014-12-07 NOTE — Assessment & Plan Note (Signed)
BP is stable on lisinopril, continue same.  

## 2014-12-07 NOTE — Telephone Encounter (Signed)
-----   Message from Debbrah Alar, NP sent at 12/07/2014  1:15 PM EDT ----- a1c looks good, has gone from 9.8 to 7- good work. Ok to remain off metformin.  Sodium mildly low. Ok to liberalize sodium a bit in her diet, repeat bmet in 2 weeks, dx hyponatremia.

## 2014-12-07 NOTE — Patient Instructions (Signed)
Please complete lab work prior to leaving. You will be contact about your referral.  Please let us know if you have not heard back within 1 week about your referral for hearing testing.  Follow up in 3 months.

## 2014-12-07 NOTE — Progress Notes (Signed)
Subjective:    Patient ID: Tanya Wilcox, female    DOB: Mar 21, 1971, 44 y.o.   MRN: [id number]  HPI  Ms. Weisse is a 44 yr old female who presents today for follow up.   Patient presents today for follow up of multiple medical problems.  Diabetes Type 2  Pt is currently maintained on the following medications for diabetes:  invokana,  and januvia reports that she stopped metformin due to nausea and trouble swallowing large pills.   Lab Results  Component Value Date   HGBA1C 9.8* 08/30/2014   HGBA1C 9.9* 05/28/2014   HGBA1C 7.3* 09/29/2013    Lab Results  Component Value Date   MICROALBUR 0.58 09/15/2012   LDLCALC 63 08/30/2014   CREATININE 0.97 08/30/2014    Last diabetic eye exam was:  1/16 Denies polyuria/polydipsia. Denies hypoglycemia Home glucose readings range  Reports that her fasting sugars 120-140  Hypertension  Patient is currently maintained on the following medications for blood pressure: lisinopril Patient reports good compliance with blood pressure medications. Patient denies chest pain, shortness of breath or swelling. Last 3 blood pressure readings in our office are as follows: BP Readings from Last 3 Encounters:  08/30/14 108/80  05/28/14 124/86  05/14/14 160/100   Hyperlipidemia  Patient is currently maintained on the following medication for hyperlipidemia: lipitor 40mg  Last lipid panel as follows:  Lab Results  Component Value Date   CHOL 113 08/30/2014   HDL 28.60* 08/30/2014   LDLCALC 63 08/30/2014   TRIG 107.0 08/30/2014   CHOLHDL 4 08/30/2014  Patient denies myalgia. Patient reports good compliance with low fat/low cholesterol diet.   Depression/Anxiety- maintained on wellbutrin, latuda and xanax. She sees Metta Clines.  She reports bilateral foot cramping at night.    Bilateral hearing loss.    Review of Systems See HPI  Past Medical History  Diagnosis Date  . Bipolar disorder   . Alcohol use   . Tobacco abuse   .  Gestational diabetes     borderline II  . Osteoarthritis     knee  . Anxiety   . Depression     History   Social History  . Marital Status: Single    Spouse Name: N/A  . Number of Children: N/A  . 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 Smoking status: Current Every Day Smoker    Types: Cigarettes    Last Attempt to Quit: 01/20/2013  . Smokeless tobacco: Never Used     Comment: 1 pack cigarettes a week  . Alcohol Use: Yes     Comment: occasionally  . Drug Use: No  . Sexual Activity: Not on file   Other Topics Concern  . Not on file   Social History Narrative    Past Surgical History  Procedure Laterality Date  . Cholecystectomy  2003    Family History  Problem Relation Age of Onset  . Hypertension Mother   . Hyperlipidemia Mother   . Alcohol abuse Father     No Known Allergies  Current Outpatient Prescriptions on File Prior to Visit  Medication Sig Dispense Refill  . ALPRAZolam (XANAX) 0.25 MG tablet Take 1 tablet (0.25 mg total) by mouth 2 (two) times daily as needed for anxiety. 30 tablet 0  . aspirin EC 81 MG tablet Take 81 mg by mouth daily.    Marland Kitchen atorvastatin (LIPITOR) 40 MG tablet Take 1 tablet by mouth  daily 90 tablet 0  . buPROPion (WELLBUTRIN XL) 300 MG 24 hr tablet Take 1 tablet (300 mg total) by mouth every morning. 30 tablet 1  . fluconazole (DIFLUCAN) 150 MG tablet One tab by mouth as needed for yeast infection, may repeat in 3 days as needed 2 tablet 3  . INVOKANA 300 MG TABS tablet Take 1 tablet by mouth  daily 90 tablet 0  . JANUVIA 100 MG tablet Take 1 tablet by mouth  daily 90 tablet 0  . LATUDA 40 MG TABS tablet Take 40 mg by mouth daily.    Marland Kitchen levonorgestrel (MIRENA) 20 MCG/24HR IUD 1 each by Intrauterine route once.    Marland Kitchen lisinopril (PRINIVIL,ZESTRIL) 20 MG tablet Take 1 tablet (20 mg total) by mouth daily. 90 tablet 1  . metFORMIN (GLUCOPHAGE XR) 500 MG 24 hr tablet Take 4  tablets (2,000 mg total) by mouth daily with breakfast. 120 tablet 2  . Multiple Vitamin (MULTIVITAMIN) tablet Take 1 tablet by mouth daily.    Marland Kitchen omeprazole (PRILOSEC) 40 MG capsule Take 1 capsule by mouth  daily 90 capsule 0  . ONE TOUCH ULTRA TEST test strip Use 3 times daily before  meals as directed 300 each 1  . valACYclovir (VALTREX) 500 MG tablet Take 1 tablet by mouth  every day 90 tablet 0   No current facility-administered medications on file prior to visit.    BP 118/78 mmHg  Pulse 77  Temp(Src) 98 F (36.7 C) (Oral)  Resp 16  Ht 5\' 11"  (1.803 m)  Wt 229 lb (103.874 kg)  BMI 31.95 kg/m2  SpO2 99%       Objective:   Physical Exam  Constitutional: She is oriented to person, place, and time. She appears well-developed and well-nourished.  HENT:  Head: Normocephalic and atraumatic.  Right Ear: Tympanic membrane and ear canal normal.  Left Ear: Tympanic membrane and ear canal normal.  Cardiovascular: Normal rate, regular rhythm and normal heart sounds.   No murmur heard. Pulmonary/Chest: Effort normal and breath sounds normal. No respiratory distress. She has no wheezes.  Musculoskeletal: She exhibits no edema.  Lymphadenopathy:    She has no cervical adenopathy.  Neurological: She is alert and oriented to person, place, and time.  Skin: Skin is warm and dry.  Psychiatric: She has a normal mood and affect. Her behavior is normal. Judgment and thought content normal.          Assessment & Plan:

## 2014-12-07 NOTE — Assessment & Plan Note (Signed)
Stable, managed by psychiatry.  

## 2014-12-07 NOTE — Telephone Encounter (Signed)
Sent result to pt via mychart. Future lab order entered.

## 2014-12-07 NOTE — Progress Notes (Signed)
Pre visit review using our clinic review tool, if applicable. No additional management support is needed unless otherwise documented below in the visit note. 

## 2014-12-07 NOTE — Assessment & Plan Note (Signed)
Per pt her control is better, not taking metformin. We discussed if A1C above goal changing januvia to [facility] which she has tolerated in the past. Obtain urine microalbumin.

## 2014-12-07 NOTE — Assessment & Plan Note (Signed)
Refer to audiology for testing 

## 2014-12-07 NOTE — Addendum Note (Signed)
Addended by: Debbrah Alar on: 12/07/2014 08:24 AM   Modules accepted: Orders, SmartSet

## 2014-12-07 NOTE — Assessment & Plan Note (Signed)
At goal on lipitor, continue same.  

## 2014-12-22 ENCOUNTER — Other Ambulatory Visit: Payer: Self-pay | Admitting: Family

## 2014-12-22 ENCOUNTER — Encounter: Payer: Self-pay | Admitting: *Deleted

## 2015-01-07 ENCOUNTER — Ambulatory Visit: Payer: 59 | Admitting: Audiology

## 2015-01-17 ENCOUNTER — Other Ambulatory Visit: Payer: Self-pay

## 2015-02-24 ENCOUNTER — Telehealth: Payer: Self-pay | Admitting: *Deleted

## 2015-02-24 NOTE — Telephone Encounter (Signed)
Pt signed ROI received via fax from [facility] requesting labs from 07/2014 to present. Lab results faxed successfully to [phone]. Signed request sent for scanning. JG//CMA

## 2015-03-06 ENCOUNTER — Other Ambulatory Visit: Payer: Self-pay | Admitting: Family

## 2015-03-09 ENCOUNTER — Other Ambulatory Visit: Payer: Self-pay | Admitting: Family

## 2015-03-09 ENCOUNTER — Ambulatory Visit: Payer: 59 | Admitting: Family

## 2015-04-28 ENCOUNTER — Other Ambulatory Visit: Payer: Self-pay | Admitting: Family

## 2015-05-18 ENCOUNTER — Encounter: Payer: Self-pay | Admitting: Family

## 2015-05-18 ENCOUNTER — Ambulatory Visit (INDEPENDENT_AMBULATORY_CARE_PROVIDER_SITE_OTHER): Payer: 59 | Admitting: Family

## 2015-05-18 VITALS — BP 124/71 | HR 73 | Temp 98.4°F | Resp 18 | Ht 71.0 in | Wt 234.8 lb

## 2015-05-18 DIAGNOSIS — I10 Essential (primary) hypertension: Secondary | ICD-10-CM | POA: Diagnosis not present

## 2015-05-18 DIAGNOSIS — E785 Hyperlipidemia, unspecified: Secondary | ICD-10-CM

## 2015-05-18 DIAGNOSIS — Z23 Encounter for immunization: Secondary | ICD-10-CM

## 2015-05-18 DIAGNOSIS — E1310 Other specified diabetes mellitus with ketoacidosis without coma: Secondary | ICD-10-CM | POA: Diagnosis not present

## 2015-05-18 DIAGNOSIS — E1110 Type 2 diabetes mellitus with ketoacidosis without coma: Secondary | ICD-10-CM

## 2015-05-18 DIAGNOSIS — R739 Hyperglycemia, unspecified: Secondary | ICD-10-CM | POA: Diagnosis not present

## 2015-05-18 MED ORDER — FLUCONAZOLE 150 MG PO TABS
ORAL_TABLET | ORAL | Status: DC
Start: 2015-05-18 — End: 2015-08-19

## 2015-05-18 MED ORDER — ATORVASTATIN CALCIUM 40 MG PO TABS: ORAL_TABLET | ORAL | Status: DC

## 2015-05-18 NOTE — Progress Notes (Signed)
Subjective:    Patient ID: Jobe Marker, female    DOB: 12-11-1970, 44 y.o.   MRN: [id number]  HPI  Ms. Step is a 44 yr old female who presents today for follow up.  1) DM2- maintained on invokana and januvia. Not checking sugars and not focusing her diet.    Lab Results  Component Value Date   HGBA1C 7.0* 12/07/2014   HGBA1C 9.8* 08/30/2014   HGBA1C 9.9* 05/28/2014   Lab Results  Component Value Date   MICROALBUR <0.7 12/07/2014   LDLCALC 63 08/30/2014   CREATININE 0.90 12/07/2014   2) Hyperlipidemia- maintained on lipitor 40mg . Denies myalgia Lab Results  Component Value Date   CHOL 113 08/30/2014   HDL 28.60* 08/30/2014   LDLCALC 63 08/30/2014   TRIG 107.0 08/30/2014   CHOLHDL 4 08/30/2014   3) HTN-  Maintained on lisinopril 20mg .   BP Readings from Last 3 Encounters:  05/18/15 124/71  12/07/14 118/78  08/30/14 108/80     Review of Systems See HPI  Past Medical History  Diagnosis Date  . Bipolar disorder ([place])   . Alcohol use ([place])   . Tobacco abuse   . Gestational diabetes     borderline II  . Osteoarthritis     knee  . Anxiety   . Depression     Social History   Social History  . Marital Status: Single    Spouse Name: N/A  . Number of Children: N/A  . 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 Smoking status: Current Every Day Smoker    Types: Cigarettes    Last Attempt to Quit: 01/20/2013  . Smokeless tobacco: Never Used     Comment: 1 pack cigarettes a week  . Alcohol Use: Yes     Comment: occasionally  . Drug Use: No  . Sexual Activity: Not on file   Other Topics Concern  . Not on file   Social History Narrative    Past Surgical History  Procedure Laterality Date  . Cholecystectomy  2003    Family History  Problem Relation Age of Onset  . Hypertension Mother   . Hyperlipidemia Mother   . Alcohol abuse Father     No Known Allergies  Current Outpatient  Prescriptions on File Prior to Visit  Medication Sig Dispense Refill  . ALPRAZolam (XANAX) 0.25 MG tablet Take 1 tablet (0.25 mg total) by mouth 2 (two) times daily as needed for anxiety. 30 tablet 0  . aspirin EC 81 MG tablet Take 81 mg by mouth daily.    . INVOKANA 300 MG TABS tablet Take 1 tablet by mouth  daily 90 tablet 0  . JANUVIA 100 MG tablet Take 1 tablet by mouth  daily 90 tablet 1  . levonorgestrel (MIRENA) 20 MCG/24HR IUD 1 each by Intrauterine route once.    Marland Kitchen lisinopril (PRINIVIL,ZESTRIL) 20 MG tablet Take 1 tablet by mouth  daily 90 tablet 1  . Multiple Vitamin (MULTIVITAMIN) tablet Take 1 tablet by mouth daily.    Marland Kitchen omeprazole (PRILOSEC) 40 MG capsule Take 1 capsule by mouth  daily 90 capsule 0  . ONE TOUCH ULTRA TEST test strip Use 3 times daily before  meals as directed 300 each 1  . valACYclovir (VALTREX) 500 MG tablet Take 1 tablet by mouth  every day 90 tablet 0   No current facility-administered medications on file prior to visit.  BP 124/71 mmHg  Pulse 73  Temp(Src) 98.4 F (36.9 C) (Oral)  Resp 18  Ht 5\' 11"  (1.803 m)  Wt 234 lb 12.8 oz (106.505 kg)  BMI 32.76 kg/m2  SpO2 100%       Objective:   Physical Exam  Constitutional: She is oriented to person, place, and time. She appears well-developed and well-nourished.  HENT:  Head: Normocephalic and atraumatic.  Cardiovascular: Normal rate, regular rhythm and normal heart sounds.   No murmur heard. Pulmonary/Chest: Effort normal and breath sounds normal. No respiratory distress. She has no wheezes.  Musculoskeletal: She exhibits no edema.  Neurological: She is alert and oriented to person, place, and time.  Psychiatric: She has a normal mood and affect. Her behavior is normal. Judgment and thought content normal.          Assessment & Plan:  Pt was counseled on smoking cessation and weight loss. Advised nicotine patch, calorie counting using my fitness pal.  Regular exercise.

## 2015-05-18 NOTE — Assessment & Plan Note (Signed)
Obtain lipid panel

## 2015-05-18 NOTE — Assessment & Plan Note (Signed)
Obtain a1c, bmet, continue current meds. Flu shot today.

## 2015-05-18 NOTE — Progress Notes (Signed)
Pre visit review using our clinic review tool, if applicable. No additional management support is needed unless otherwise documented below in the visit note. 

## 2015-05-18 NOTE — Assessment & Plan Note (Signed)
Stable on current meds continue same 

## 2015-05-18 NOTE — Patient Instructions (Addendum)
Hold lipitor x 3 days after taking diflucan due to possible drug interaction. Complete lab work at [facility]. Good luck quitting smoking and working on healthy diet, exercise, and weight loss.

## 2015-05-27 ENCOUNTER — Other Ambulatory Visit: Payer: Self-pay | Admitting: Family

## 2015-05-27 LAB — CBC AND DIFFERENTIAL

## 2015-05-27 LAB — HEMOGLOBIN A1C: HEMOGLOBIN A1C: 7.1 % — AB (ref 4.0–6.0)

## 2015-05-27 NOTE — Telephone Encounter (Signed)
Refill sent per [facility] refill protocol/SLS  

## 2015-06-07 ENCOUNTER — Encounter: Payer: Self-pay | Admitting: Family

## 2015-06-07 LAB — CBC AND DIFFERENTIAL

## 2015-08-17 ENCOUNTER — Other Ambulatory Visit: Payer: Self-pay | Admitting: Family

## 2015-08-19 ENCOUNTER — Encounter: Payer: Self-pay | Admitting: Family

## 2015-08-19 ENCOUNTER — Ambulatory Visit (INDEPENDENT_AMBULATORY_CARE_PROVIDER_SITE_OTHER): Payer: Managed Care, Other (non HMO) | Admitting: Family

## 2015-08-19 VITALS — BP 134/70 | HR 64 | Temp 98.3°F | Resp 18 | Ht 71.0 in | Wt 232.4 lb

## 2015-08-19 DIAGNOSIS — E1110 Type 2 diabetes mellitus with ketoacidosis without coma: Secondary | ICD-10-CM

## 2015-08-19 DIAGNOSIS — E1310 Other specified diabetes mellitus with ketoacidosis without coma: Secondary | ICD-10-CM | POA: Diagnosis not present

## 2015-08-19 DIAGNOSIS — I10 Essential (primary) hypertension: Secondary | ICD-10-CM | POA: Diagnosis not present

## 2015-08-19 DIAGNOSIS — E785 Hyperlipidemia, unspecified: Secondary | ICD-10-CM | POA: Diagnosis not present

## 2015-08-19 LAB — HM DIABETES EYE EXAM

## 2015-08-19 MED ORDER — SITAGLIPTIN PHOSPHATE 100 MG PO TABS: ORAL_TABLET | ORAL | Status: DC

## 2015-08-19 MED ORDER — LISINOPRIL 20 MG PO TABS: ORAL_TABLET | ORAL | Status: DC

## 2015-08-19 NOTE — Progress Notes (Signed)
Pre visit review using our clinic review tool, if applicable. No additional management support is needed unless otherwise documented below in the visit note. 

## 2015-08-19 NOTE — Patient Instructions (Addendum)
Please complete lab work after 08/27/15. Keep up the good work with healthy diet, exercise, weight loss efforts.

## 2015-08-19 NOTE — Assessment & Plan Note (Signed)
She has improved her diet.  I suspect A1C will be improved.

## 2015-08-19 NOTE — Assessment & Plan Note (Signed)
BP stable, continue current meds. Obtain follow up bmet. 

## 2015-08-19 NOTE — Progress Notes (Signed)
Subjective:    Patient ID: Tanya Wilcox, female    DOB: Nov 27, 1970, 45 y.o.   MRN: [id number]  HPI  Tanya Wilcox is a 45 yr old female who presents today for follow up.  1) Hyperlipidemia- maintained on lipitor 40mg .  Lab Results  Component Value Date   CHOL 113 08/30/2014   HDL 28.60* 08/30/2014   LDLCALC 63 08/30/2014   TRIG 107.0 08/30/2014   CHOLHDL 4 08/30/2014   2) HTN- maintained on lisinopril 20mg .   BP Readings from Last 3 Encounters:  08/19/15 134/70  05/18/15 124/71  12/07/14 118/78   3)  DM2-  On invokana and januvia. Not checking sugar regularly.  Has eye exam today.    Lab Results  Component Value Date   HGBA1C 7.1* 05/27/2015   HGBA1C 7.0* 12/07/2014   HGBA1C 9.8* 08/30/2014   Lab Results  Component Value Date   MICROALBUR <0.7 12/07/2014   LDLCALC 63 08/30/2014   CREATININE 0.90 12/07/2014      Review of Systems  Genitourinary: Negative for frequency.   See HPI  Past Medical History  Diagnosis Date  . Bipolar disorder ([place])   . Alcohol use ([place])   . Tobacco abuse   . Gestational diabetes     borderline II  . Osteoarthritis     knee  . Anxiety   . Depression     Social History   Social History  . Marital Status: Single    Spouse Name: N/A  . Number of Children: N/A  . 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 Smoking status: Current Every Day Smoker    Types: Cigarettes    Last Attempt to Quit: 01/20/2013  . Smokeless tobacco: Never Used     Comment: 1 pack cigarettes a week  . Alcohol Use: Yes     Comment: occasionally  . Drug Use: No  . Sexual Activity: Not on file   Other Topics Concern  . Not on file   Social History Narrative    Past Surgical History  Procedure Laterality Date  . Cholecystectomy  2003    Family History  Problem Relation Age of Onset  . Hypertension Mother   . Hyperlipidemia Mother   . Alcohol abuse Father     No Known  Allergies  Current Outpatient Prescriptions on File Prior to Visit  Medication Sig Dispense Refill  . aspirin EC 81 MG tablet Take 81 mg by mouth daily.    Marland Kitchen atorvastatin (LIPITOR) 40 MG tablet Take 1 tablet by mouth  daily 90 tablet 1  . INVOKANA 300 MG TABS tablet Take 1 tablet by mouth  daily 90 tablet 1  . JANUVIA 100 MG tablet Take 1 tablet by mouth  daily 90 tablet 1  . levonorgestrel (MIRENA) 20 MCG/24HR IUD 1 each by Intrauterine route once.    Marland Kitchen lisinopril (PRINIVIL,ZESTRIL) 20 MG tablet Take 1 tablet by mouth  daily 90 tablet 1  . Multiple Vitamin (MULTIVITAMIN) tablet Take 1 tablet by mouth daily.    Marland Kitchen omeprazole (PRILOSEC) 40 MG capsule Take 1 capsule by mouth  daily 90 capsule 0  . ONE TOUCH ULTRA TEST test strip Use 3 times daily before  meals as directed 300 each 1  . valACYclovir (VALTREX) 500 MG tablet Take 1 tablet by mouth  every day 90 tablet 0   No current facility-administered medications on file prior to visit.  BP 134/70 mmHg  Pulse 64  Temp(Src) 98.3 F (36.8 C) (Oral)  Resp 18  Ht 5\' 11"  (1.803 m)  Wt 232 lb 6.4 oz (105.416 kg)  BMI 32.43 kg/m2  SpO2 100%       Objective:   Physical Exam  Constitutional: She is oriented to person, place, and time. She appears well-developed and well-nourished.  HENT:  Head: Normocephalic and atraumatic.  Cardiovascular: Normal rate, regular rhythm and normal heart sounds.   No murmur heard. Pulmonary/Chest: Effort normal and breath sounds normal. No respiratory distress. She has no wheezes.  Musculoskeletal: She exhibits no edema.  Neurological: She is alert and oriented to person, place, and time.  Psychiatric: She has a normal mood and affect. Her behavior is normal. Judgment and thought content normal.          Assessment & Plan:

## 2015-08-19 NOTE — Assessment & Plan Note (Signed)
Tolerating statin, obtain flp 

## 2015-10-21 ENCOUNTER — Telehealth: Payer: Self-pay | Admitting: Behavioral Health

## 2015-10-21 NOTE — Telephone Encounter (Signed)
Unable to reach patient at time of Pre-Visit Call.  Left message for patient to return call when available.    

## 2015-10-21 NOTE — Telephone Encounter (Signed)
LM x 2 for pre-visit call.

## 2015-10-21 NOTE — Telephone Encounter (Signed)
Patient returning your call.

## 2015-10-24 ENCOUNTER — Encounter: Payer: Self-pay | Admitting: Family

## 2015-10-24 ENCOUNTER — Encounter: Payer: Managed Care, Other (non HMO) | Admitting: Family

## 2015-10-24 ENCOUNTER — Ambulatory Visit (INDEPENDENT_AMBULATORY_CARE_PROVIDER_SITE_OTHER): Payer: Managed Care, Other (non HMO) | Admitting: Family

## 2015-10-24 VITALS — BP 110/84 | HR 73 | Temp 98.0°F | Resp 16 | Ht 68.0 in | Wt 225.8 lb

## 2015-10-24 DIAGNOSIS — E1110 Type 2 diabetes mellitus with ketoacidosis without coma: Secondary | ICD-10-CM

## 2015-10-24 DIAGNOSIS — I10 Essential (primary) hypertension: Secondary | ICD-10-CM

## 2015-10-24 DIAGNOSIS — Z0000 Encounter for general adult medical examination without abnormal findings: Secondary | ICD-10-CM

## 2015-10-24 DIAGNOSIS — E785 Hyperlipidemia, unspecified: Secondary | ICD-10-CM

## 2015-10-24 DIAGNOSIS — H9190 Unspecified hearing loss, unspecified ear: Secondary | ICD-10-CM

## 2015-10-24 NOTE — Patient Instructions (Signed)
Please complete lab work prior to leaving. Try to add more exercise, fresh fruits and veggies. Great job on the weight loss, keep up the good work.  Follow up in 3-4 months.

## 2015-10-24 NOTE — Progress Notes (Signed)
Pre visit review using our clinic review tool, if applicable. No additional management support is needed unless otherwise documented below in the visit note. 

## 2015-10-24 NOTE — Progress Notes (Signed)
Subjective:    Patient ID: Tanya Wilcox, female    DOB: [birth year], 45 y.o.   MRN: [id number]  HPI    Patient presents today for complete physical.  Immunizations:  Up to date Diet: diet is fair Exercise: not exercising.  Pap Smear: 10/17/15 (dr. Matthew Saras) Mammogram: 10/17/15  Wt Readings from Last 3 Encounters:  10/24/15 225 lb 12.8 oz (102.422 kg)  08/19/15 232 lb 6.4 oz (105.416 kg)  05/18/15 234 lb 12.8 oz (106.505 kg)  vision- 1/17  DM2- on invokana, januvia.  Has not been checking her sugars at home.   Lab Results  Component Value Date   HGBA1C 7.1* 05/27/2015   HGBA1C 7.0* 12/07/2014   HGBA1C 9.8* 08/30/2014   Lab Results  Component Value Date   MICROALBUR <0.7 12/07/2014   LDLCALC 63 08/30/2014   CREATININE 0.90 12/07/2014    Review of Systems  Constitutional: Negative for unexpected weight change.  HENT: Negative for rhinorrhea.        Reports some hearing loss. Never followed through is audiology- wants to reschedule.   Eyes: Negative for visual disturbance.  Respiratory: Negative for cough and shortness of breath.   Cardiovascular: Negative for chest pain and leg swelling.  Gastrointestinal: Negative for diarrhea.       + constipation.   Genitourinary: Negative for dysuria, frequency and menstrual problem.  Musculoskeletal: Negative for myalgias and arthralgias.       Legs cramp after a long shift  Skin: Negative for rash.  Neurological: Negative for headaches.       Notes occasional numbness right small toe  Hematological: Negative for adenopathy.  Psychiatric/Behavioral: Positive for hallucinations.       Denies depression/anxiety   Past Medical History  Diagnosis Date  . Bipolar disorder ([place])   . Alcohol use ([place])   . Tobacco abuse   . Gestational diabetes     borderline II  . Osteoarthritis     knee  . Anxiety   . Depression     Social History   Social History  . Marital Status: Single    Spouse Name: N/A  . Number of Children:  N/A  . 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 Smoking status: Current Every Day Smoker    Types: Cigarettes    Last Attempt to Quit: 01/20/2013  . Smokeless tobacco: Never Used     Comment: 1 pack cigarettes a week  . Alcohol Use: 0.0 oz/week    0 Standard drinks or equivalent per week     Comment: occasionally  . Drug Use: No  . Sexual Activity: Not on file   Other Topics Concern  . Not on file   Social History Narrative    Past Surgical History  Procedure Laterality Date  . Cholecystectomy  2003    Family History  Problem Relation Age of Onset  . Hypertension Mother   . Hyperlipidemia Mother   . Alcohol abuse Father     No Known Allergies  Current Outpatient Prescriptions on File Prior to Visit  Medication Sig Dispense Refill  . aspirin EC 81 MG tablet Take 81 mg by mouth daily.    Marland Kitchen atorvastatin (LIPITOR) 40 MG tablet Take 1 tablet by mouth  daily 90 tablet 1  . INVOKANA 300 MG TABS tablet Take 1 tablet by mouth  daily 90 tablet 1  . levonorgestrel (MIRENA) 20 MCG/24HR IUD 1 each by Intrauterine  route once.    Marland Kitchen lisinopril (PRINIVIL,ZESTRIL) 20 MG tablet Take 1 tablet by mouth  daily 90 tablet 1  . Multiple Vitamin (MULTIVITAMIN) tablet Take 1 tablet by mouth daily.    . ONE TOUCH ULTRA TEST test strip Use 3 times daily before  meals as directed 300 each 1  . sitaGLIPtin (JANUVIA) 100 MG tablet Take 1 tablet by mouth  daily 90 tablet 1  . valACYclovir (VALTREX) 500 MG tablet Take 1 tablet by mouth  every day 90 tablet 0  . omeprazole (PRILOSEC) 40 MG capsule Take 1 capsule by mouth  daily (Patient not taking: Reported on 10/24/2015) 90 capsule 0   No current facility-administered medications on file prior to visit.    BP 110/84 mmHg  Pulse 73  Temp(Src) 98 F (36.7 C) (Oral)  Resp 16  Ht 5\' 8"  (1.727 m)  Wt 225 lb 12.8 oz (102.422 kg)  BMI 34.34 kg/m2  SpO2 98%        Objective:    Physical Exam Physical Exam  Constitutional: She is oriented to person, place, and time. She appears well-developed and well-nourished. No distress.  HENT:  Head: Normocephalic and atraumatic.  Right Ear: Tympanic membrane and ear canal normal.  Left Ear: Tympanic membrane and ear canal normal.  Mouth/Throat: Oropharynx is clear and moist.  Eyes: Pupils are equal, round, and reactive to light. No scleral icterus.  Neck: Normal range of motion. No thyromegaly present.  Cardiovascular: Normal rate and regular rhythm.   No murmur heard. Pulmonary/Chest: Effort normal and breath sounds normal. No respiratory distress. He has no wheezes. She has no rales. She exhibits no tenderness.  Abdominal: Soft. Bowel sounds are normal. He exhibits no distension and no mass. There is no tenderness. There is no rebound and no guarding.  Musculoskeletal: She exhibits no edema.  Lymphadenopathy:    She has no cervical adenopathy.  Neurological: She is alert and oriented to person, place, and time. She has 1+ bilateral reflexes. She exhibits normal muscle tone. Coordination normal. R small toe- + sensation to monofilament.  Skin: Skin is warm and dry.  Psychiatric: She has a normal mood and affect. Her behavior is normal. Judgment and thought content normal.  Breast/pelvic: deferred.           Assessment & Plan:          Assessment & Plan:  Preventative care- encouraged pt to add more exercise, fresh fruits/veggies, continue weight loss efforts. Obtain routine lab work.  Pap/immunizations/mammo up to date.  Refer to audiology for formal testing.  In regards to numbness right small toe, denies current low back pain. Reports + hx of sciatica but not currently bothering her.   Advised pt to let me know if she develops low back pain, worsening numbness.    EKG tracing is personally reviewed.  EKG notes NSR.  No acute changes.

## 2015-11-15 ENCOUNTER — Telehealth: Payer: Self-pay | Admitting: Family

## 2015-11-15 MED ORDER — GLIMEPIRIDE 2 MG PO TABS: 2.0000 mg | ORAL_TABLET | Freq: Every day | ORAL | Status: DC

## 2015-11-15 NOTE — Telephone Encounter (Signed)
Notified pt and she voices understanding. 

## 2015-11-15 NOTE — Telephone Encounter (Signed)
Reviewed labs. Sugar slightly above goal with A1C 7.2.  I would like her to add amaryl once daily.  Call if sugars <80.  Cholesterol is at goal.

## 2015-11-26 ENCOUNTER — Other Ambulatory Visit: Payer: Self-pay | Admitting: Family

## 2016-01-14 ENCOUNTER — Other Ambulatory Visit: Payer: Self-pay | Admitting: Family

## 2016-01-16 NOTE — Telephone Encounter (Signed)
Rx's sent to the pharmacy by e-script.//AB/CMA 

## 2016-01-23 ENCOUNTER — Ambulatory Visit (INDEPENDENT_AMBULATORY_CARE_PROVIDER_SITE_OTHER): Payer: Managed Care, Other (non HMO) | Admitting: Family

## 2016-01-23 ENCOUNTER — Encounter: Payer: Self-pay | Admitting: Family

## 2016-01-23 VITALS — BP 120/76 | HR 71 | Temp 97.8°F | Resp 16 | Ht 68.0 in | Wt 227.2 lb

## 2016-01-23 DIAGNOSIS — E119 Type 2 diabetes mellitus without complications: Secondary | ICD-10-CM

## 2016-01-23 DIAGNOSIS — K219 Gastro-esophageal reflux disease without esophagitis: Secondary | ICD-10-CM

## 2016-01-23 DIAGNOSIS — I10 Essential (primary) hypertension: Secondary | ICD-10-CM

## 2016-01-23 DIAGNOSIS — E1165 Type 2 diabetes mellitus with hyperglycemia: Secondary | ICD-10-CM | POA: Diagnosis not present

## 2016-01-23 DIAGNOSIS — R202 Paresthesia of skin: Secondary | ICD-10-CM | POA: Diagnosis not present

## 2016-01-23 DIAGNOSIS — IMO0001 Reserved for inherently not codable concepts without codable children: Secondary | ICD-10-CM

## 2016-01-23 DIAGNOSIS — E785 Hyperlipidemia, unspecified: Secondary | ICD-10-CM | POA: Diagnosis not present

## 2016-01-23 LAB — LIPID PANEL
CHOL/HDL RATIO: 3
CHOLESTEROL: 114 mg/dL (ref 0–200)
HDL: 38.4 mg/dL — ABNORMAL LOW (ref >39.00)
LDL CALC: 64 mg/dL (ref 0–99)
NONHDL: 75.73
Triglycerides: 60 mg/dL (ref 0.0–149.0)
VLDL: 12 mg/dL (ref 0.0–40.0)

## 2016-01-23 LAB — BASIC METABOLIC PANEL
BUN: 11 mg/dL (ref 6–23)
CALCIUM: 9.7 mg/dL (ref 8.4–10.5)
CO2: 23 mEq/L (ref 19–32)
Chloride: 105 mEq/L (ref 96–112)
Creatinine, Ser: 0.84 mg/dL (ref 0.40–1.20)
GFR: 94.36 mL/min (ref >60.00)
Glucose, Bld: 114 mg/dL — ABNORMAL HIGH (ref 70–99)
Potassium: 4.1 mEq/L (ref 3.5–5.1)
SODIUM: 134 meq/L — AB (ref 135–145)

## 2016-01-23 LAB — VITAMIN B12: Vitamin B-12: 1262 pg/mL — ABNORMAL HIGH (ref 211–911)

## 2016-01-23 LAB — FOLATE: FOLATE: 15.9 ng/mL (ref >5.9)

## 2016-01-23 LAB — HEMOGLOBIN A1C: HEMOGLOBIN A1C: 6.6 % — AB (ref 4.6–6.5)

## 2016-01-23 MED ORDER — MELOXICAM 7.5 MG PO TABS: 7.5000 mg | ORAL_TABLET | Freq: Every day | ORAL | Status: DC

## 2016-01-23 NOTE — Assessment & Plan Note (Signed)
Clinically stable, continue current meds. Obtain follow up A1C.

## 2016-01-23 NOTE — Assessment & Plan Note (Signed)
Tolerating statin, obtain lipid panel.  

## 2016-01-23 NOTE — Patient Instructions (Signed)
Please complete lab work prior to leaving.   

## 2016-01-23 NOTE — Assessment & Plan Note (Signed)
Stable on lisinopril. Continue same, obtain bmet.

## 2016-01-23 NOTE — Progress Notes (Signed)
Subjective:    Patient ID: Tanya Wilcox, female    DOB: 11/05/1970, 45 y.o.   MRN: [id number]  HPI  Tanya Wilcox is a 45 yr old female who presents today for follow up.  1) DM2- maintained on amaryl, invokana, januvia. Low sugar 80, one episode of sugar 200.   Lab Results  Component Value Date   HGBA1C 7.1* 05/27/2015   HGBA1C 7.0* 12/07/2014   HGBA1C 9.8* 08/30/2014   Lab Results  Component Value Date   MICROALBUR <0.7 12/07/2014   LDLCALC 63 08/30/2014   CREATININE 0.90 12/07/2014   2) HTN- maintained on lisinopril 20mg .  BP Readings from Last 3 Encounters:  01/23/16 120/76  10/24/15 110/84  08/19/15 134/70   3) Tingling- reports tingling in the right leg and foot while sitting x 2 months.  Chart review reveals note of vertebral osteophytosis at L4-5 without disc space narrowing back in 2013.   4) GERD- not needing PPI.  Reports stable.    Review of Systems  Respiratory: Negative for cough.    See HPI  Past Medical History  Diagnosis Date  . Bipolar disorder ([place])   . Alcohol use ([place])   . Tobacco abuse   . Gestational diabetes     borderline II  . Osteoarthritis     knee  . Anxiety   . Depression      Social History   Social History  . Marital Status: Single    Spouse Name: N/A  . Number of Children: N/A  . 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 Smoking status: Current Every Day Smoker    Types: Cigarettes    Last Attempt to Quit: 01/20/2013  . Smokeless tobacco: Never Used     Comment: 1 pack cigarettes a week  . Alcohol Use: 0.0 oz/week    0 Standard drinks or equivalent per week     Comment: occasionally  . Drug Use: No  . Sexual Activity: Not on file   Other Topics Concern  . Not on file   Social History Narrative   Lives with daughter   Works at [employer]   Enjoys sleeping   1 dog   Completed high school, some college       Past Surgical History    Procedure Laterality Date  . Cholecystectomy  2003    Family History  Problem Relation Age of Onset  . Hypertension Mother   . Hyperlipidemia Mother   . Alcohol abuse Father     No Known Allergies  Current Outpatient Prescriptions on File Prior to Visit  Medication Sig Dispense Refill  . aspirin EC 81 MG tablet Take 81 mg by mouth daily.    Marland Kitchen atorvastatin (LIPITOR) 40 MG tablet Take 1 tablet by mouth  daily 90 tablet 1  . glimepiride (AMARYL) 2 MG tablet Take 1 tablet (2 mg total) by mouth daily before breakfast. 30 tablet 3  . INVOKANA 300 MG TABS tablet Take 1 tablet by mouth  daily 90 tablet 1  . levonorgestrel (MIRENA) 20 MCG/24HR IUD 1 each by Intrauterine route once.    Marland Kitchen lisinopril (PRINIVIL,ZESTRIL) 20 MG tablet Take 1 tablet by mouth  daily 90 tablet 1  . Multiple Vitamin (MULTIVITAMIN) tablet Take 1 tablet by mouth daily.    Marland Kitchen omeprazole (PRILOSEC) 40 MG capsule Take 1 capsule by mouth  daily 90 capsule 0  . ONE  TOUCH ULTRA TEST test strip Test 3 times daily before  meals as directed 300 each 1  . sitaGLIPtin (JANUVIA) 100 MG tablet Take 1 tablet by mouth  daily 90 tablet 1  . valACYclovir (VALTREX) 500 MG tablet Take 1 tablet by mouth  every day 90 tablet 0   No current facility-administered medications on file prior to visit.    BP 120/76 mmHg  Pulse 71  Temp(Src) 97.8 F (36.6 C) (Oral)  Resp 16  Ht 5\' 8"  (1.727 m)  Wt 227 lb 3.2 oz (103.057 kg)  BMI 34.55 kg/m2  SpO2 99%       Objective:   Physical Exam  Constitutional: She is oriented to person, place, and time. She appears well-developed and well-nourished.  HENT:  Head: Normocephalic and atraumatic.  Cardiovascular: Normal rate, regular rhythm and normal heart sounds.   No murmur heard. Pulmonary/Chest: Effort normal and breath sounds normal. No respiratory distress. She has no wheezes.  Musculoskeletal: She exhibits no edema.  Neurological: She is alert and oriented to person, place, and time.   Psychiatric: She has a normal mood and affect. Her behavior is normal. Judgment and thought content normal.          Assessment & Plan:  Paresthesia R foot/leg- ? Neuropathy versus radicular symptoms from lumbar disc disease. Obtain b12/folate. Trial of short course of meloxicam. If symptoms worsen or do not improve, consider additional spine imaging.

## 2016-01-23 NOTE — Progress Notes (Signed)
Pre visit review using our clinic review tool, if applicable. No additional management support is needed unless otherwise documented below in the visit note. 

## 2016-01-23 NOTE — Assessment & Plan Note (Signed)
Stable off of PPI.  

## 2016-02-29 ENCOUNTER — Other Ambulatory Visit: Payer: Self-pay | Admitting: Family

## 2016-03-04 ENCOUNTER — Other Ambulatory Visit: Payer: Self-pay | Admitting: Family

## 2016-03-05 ENCOUNTER — Telehealth: Payer: Self-pay | Admitting: *Deleted

## 2016-03-05 MED ORDER — GLIMEPIRIDE 2 MG PO TABS: 2.0000 mg | ORAL_TABLET | Freq: Every day | ORAL | 1 refills | Status: DC

## 2016-03-05 NOTE — Telephone Encounter (Signed)
Last OV: 01/23/16 Last filled: 01/23/16, #14, 0 RF Sig: Take 1 tablet (7.5 mg total) by mouth daily.  01/23/16 A/P note: Paresthesia R foot/leg- ? Neuropathy versus radicular symptoms from lumbar disc disease. Obtain b12/folate. Trial of short course of meloxicam. If symptoms worsen or do not improve, consider additional spine imaging.

## 2016-03-05 NOTE — Telephone Encounter (Signed)
Received fax from [facility] for glimepiride. Pt last OV 01/23/16 and due 04/24/16. Rx sent.

## 2016-03-05 NOTE — Telephone Encounter (Signed)
Please advise pt if symptoms not improved needs OV.  Refill sent.

## 2016-04-09 ENCOUNTER — Other Ambulatory Visit: Payer: Self-pay | Admitting: Family

## 2016-04-18 ENCOUNTER — Encounter: Payer: Self-pay | Admitting: Family

## 2016-04-18 ENCOUNTER — Ambulatory Visit (HOSPITAL_BASED_OUTPATIENT_CLINIC_OR_DEPARTMENT_OTHER)
Admission: RE | Admit: 2016-04-18 | Discharge: 2016-04-18 | Disposition: A | Discharge status: Home / Self Care | Payer: Managed Care, Other (non HMO) | Source: Ambulatory Visit | Attending: Family | Admitting: Family

## 2016-04-18 ENCOUNTER — Ambulatory Visit (INDEPENDENT_AMBULATORY_CARE_PROVIDER_SITE_OTHER): Payer: Managed Care, Other (non HMO) | Admitting: Family

## 2016-04-18 ENCOUNTER — Other Ambulatory Visit: Payer: Self-pay | Admitting: Family

## 2016-04-18 VITALS — BP 126/83 | HR 76 | Temp 98.3°F | Resp 18 | Ht 68.0 in | Wt 236.0 lb

## 2016-04-18 DIAGNOSIS — IMO0001 Reserved for inherently not codable concepts without codable children: Secondary | ICD-10-CM

## 2016-04-18 DIAGNOSIS — R5383 Other fatigue: Secondary | ICD-10-CM | POA: Diagnosis not present

## 2016-04-18 DIAGNOSIS — R011 Cardiac murmur, unspecified: Secondary | ICD-10-CM

## 2016-04-18 DIAGNOSIS — R609 Edema, unspecified: Secondary | ICD-10-CM | POA: Insufficient documentation

## 2016-04-18 DIAGNOSIS — Z23 Encounter for immunization: Secondary | ICD-10-CM

## 2016-04-18 DIAGNOSIS — E1165 Type 2 diabetes mellitus with hyperglycemia: Secondary | ICD-10-CM

## 2016-04-18 DIAGNOSIS — R002 Palpitations: Secondary | ICD-10-CM

## 2016-04-18 DIAGNOSIS — E119 Type 2 diabetes mellitus without complications: Secondary | ICD-10-CM

## 2016-04-18 DIAGNOSIS — I10 Essential (primary) hypertension: Secondary | ICD-10-CM

## 2016-04-18 MED ORDER — FUROSEMIDE 20 MG PO TABS: 20.0000 mg | ORAL_TABLET | Freq: Every day | ORAL | 0 refills | Status: DC | PRN

## 2016-04-18 NOTE — Progress Notes (Signed)
Subjective:    Patient ID: Tanya Wilcox, female    DOB: January 31, 1971, 45 y.o.   MRN: [id number]  HPI  Tanya Wilcox is a 45 yr old female who presents today for follow up.  1) Leg pain- reports bilateral leg pain and swelling.  Reports 10 pound weight gain in 1 week. Reports that she had some palpitations last week, none recently. Denies cp or sob.  Did have a 1 hour 40 minute car trip last weekend and swelling was "bad" after that trip. Reports swelling has gone down som.  Wt Readings from Last 3 Encounters:  04/18/16 236 lb (107 kg)  01/23/16 227 lb 3.2 oz (103.1 kg)  10/24/15 225 lb 12.8 oz (102.4 kg)   2) Fatigue- reports that fatigue has been present x 2 weeks. Has had an associated HA last week. Denies black/bloody stools.    3) DM2-  On amaryl, januvia, invokana.  Reports that the highest was 200. Denies hypoglycemia.   Lab Results  Component Value Date   HGBA1C 6.6 (H) 01/23/2016   HGBA1C 7.1 (A) 05/27/2015   HGBA1C 7.0 (H) 12/07/2014   Lab Results  Component Value Date   MICROALBUR <0.7 12/07/2014   LDLCALC 64 01/23/2016   CREATININE 0.84 01/23/2016   4) HTN-  Maintained on lisinopril.   BP Readings from Last 3 Encounters:  04/18/16 126/83  01/23/16 120/76  10/24/15 110/84     Review of Systems    see HPI  Past Medical History:  Diagnosis Date  . Alcohol use ([place])   . Anxiety   . Bipolar disorder ([place])   . Depression   . Gestational diabetes    borderline II  . Osteoarthritis    knee  . Tobacco abuse      Social History   Social History  . Marital status: Single    Spouse name: N/A  . Number of children: N/A  . 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 Smoking status: Current Every Day Smoker    Types: Cigarettes    Last attempt to quit: 01/20/2013  . Smokeless tobacco: Never Used     Comment: 1 pack cigarettes a week  . Alcohol use 0.0 oz/week     Comment: occasionally  . Drug use: No   . Sexual activity: Not on file   Other Topics Concern  . Not on file   Social History Narrative   Lives with daughter   Works at [employer]   Enjoys sleeping   1 dog   Completed high school, some college       Past Surgical History:  Procedure Laterality Date  . CHOLECYSTECTOMY  2003    Family History  Problem Relation Age of Onset  . Hypertension Mother   . Hyperlipidemia Mother   . Alcohol abuse Father     No Known Allergies  Current Outpatient Prescriptions on File Prior to Visit  Medication Sig Dispense Refill  . aspirin EC 81 MG tablet Take 81 mg by mouth daily.    Marland Kitchen atorvastatin (LIPITOR) 40 MG tablet Take 1 tablet by mouth  daily 90 tablet 1  . glimepiride (AMARYL) 2 MG tablet Take 1 tablet (2 mg total) by mouth daily before breakfast. 90 tablet 1  . INVOKANA 300 MG TABS tablet Take 1 tablet by mouth  daily 90 tablet 1  . JANUVIA 100 MG tablet Take 1 tablet by  mouth  daily 90 tablet 1  . levonorgestrel (MIRENA) 20 MCG/24HR IUD 1 each by Intrauterine route once.    Marland Kitchen lisinopril (PRINIVIL,ZESTRIL) 20 MG tablet Take 1 tablet by mouth  daily 90 tablet 1  . meloxicam (MOBIC) 7.5 MG tablet TAKE 1 TABLET(7.5 MG) BY MOUTH DAILY 14 tablet 0  . Multiple Vitamin (MULTIVITAMIN) tablet Take 1 tablet by mouth daily.    . ONE TOUCH ULTRA TEST test strip Test 3 times daily before  meals as directed 300 each 1  . valACYclovir (VALTREX) 500 MG tablet Take 1 tablet by mouth  every day 90 tablet 1   No current facility-administered medications on file prior to visit.     BP 126/83 (BP Location: Right Arm, Cuff Size: Large)   Pulse 76   Temp 98.3 F (36.8 C) (Oral)   Resp 18   Ht 5\' 8"  (1.727 m)   Wt 236 lb (107 kg)   SpO2 100% Comment: room air  BMI 35.88 kg/m    Objective:   Physical Exam  Constitutional: She is oriented to person, place, and time. She appears well-developed and well-nourished.  HENT:  Head: Normocephalic and atraumatic.    Cardiovascular: Normal rate and regular rhythm.   Murmur heard.  Systolic murmur is present with a grade of 2/6  1+ bilateral LE edema  Pulmonary/Chest: Effort normal and breath sounds normal. No respiratory distress. She has no wheezes.  Neurological: She is alert and oriented to person, place, and time.  Skin: Skin is warm and dry.  Psychiatric: She has a normal mood and affect. Her behavior is normal. Judgment and thought content normal.          Assessment & Plan:  Flu shot today.  EKG tracing is personally reviewed.  EKG notes NSR.  No acute changes.   Murmur- will obtain a 2D echo  LE edema- will obtain LE doppler to rule out DVT, add prn lasix.   Palpitations- EKG is normal.  Obtain TSH, BMET.  Fatigue- obtain CBC, TSH.

## 2016-04-18 NOTE — Assessment & Plan Note (Signed)
Stable on lisinopril, continue same.  

## 2016-04-18 NOTE — Progress Notes (Signed)
Pre visit review using our clinic review tool, if applicable. No additional management support is needed unless otherwise documented below in the visit note. 

## 2016-04-18 NOTE — Assessment & Plan Note (Signed)
Clinically stable.  Obtain follow up A1C.  

## 2016-04-18 NOTE — Patient Instructions (Addendum)
Please complete lab work prior to leaving.  For swelling in your legs I have ordered an ultrasound on the first floor to be done today. I have also sent furosemide (fluid pill) to your pharmacy to use once daily on an as needed basis.  For heart murmur- I have ordered an echochardiogram to take a look at your heart.  Please call if you have any further weight gain or if you develop shortness of breath.

## 2016-04-19 ENCOUNTER — Encounter: Payer: Self-pay | Admitting: Family

## 2016-04-19 ENCOUNTER — Other Ambulatory Visit: Payer: Self-pay | Admitting: Family

## 2016-04-19 LAB — COMPREHENSIVE METABOLIC PANEL
ALBUMIN: 3.9 g/dL (ref 3.5–5.2)
ALK PHOS: 55 U/L (ref 39–117)
ALT: 19 U/L (ref 0–35)
AST: 15 U/L (ref 0–37)
BUN: 11 mg/dL (ref 6–23)
CALCIUM: 9.2 mg/dL (ref 8.4–10.5)
CHLORIDE: 104 meq/L (ref 96–112)
CO2: 25 mEq/L (ref 19–32)
CREATININE: 0.89 mg/dL (ref 0.40–1.20)
GFR: 88.17 mL/min (ref >60.00)
Glucose, Bld: 110 mg/dL — ABNORMAL HIGH (ref 70–99)
Potassium: 3.9 mEq/L (ref 3.5–5.1)
Sodium: 137 mEq/L (ref 135–145)
TOTAL PROTEIN: 7 g/dL (ref 6.0–8.3)
Total Bilirubin: 0.3 mg/dL (ref 0.2–1.2)

## 2016-04-19 LAB — CBC WITH DIFFERENTIAL/PLATELET
Basophils Absolute: 0 10*3/uL (ref 0.0–0.1)
Basophils Relative: 0.4 % (ref 0.0–3.0)
EOS PCT: 1.5 % (ref 0.0–5.0)
Eosinophils Absolute: 0.1 10*3/uL (ref 0.0–0.7)
HEMATOCRIT: 38.9 % (ref 36.0–46.0)
Hemoglobin: 13.3 g/dL (ref 12.0–15.0)
LYMPHS ABS: 2.4 10*3/uL (ref 0.7–4.0)
Lymphocytes Relative: 30.9 % (ref 12.0–46.0)
MCHC: 34.1 g/dL (ref 30.0–36.0)
MCV: 96.8 fl (ref 78.0–100.0)
MONOS PCT: 7.2 % (ref 3.0–12.0)
Monocytes Absolute: 0.5 10*3/uL (ref 0.1–1.0)
NEUTROS ABS: 4.6 10*3/uL (ref 1.4–7.7)
NEUTROS PCT: 60 % (ref 43.0–77.0)
PLATELETS: 437 10*3/uL — AB (ref 150.0–400.0)
RBC: 4.02 Mil/uL (ref 3.87–5.11)
RDW: 12.7 % (ref 11.5–15.5)
WBC: 7.6 10*3/uL (ref 4.0–10.5)

## 2016-04-19 LAB — HEMOGLOBIN A1C: Hgb A1c MFr Bld: 6.4 % (ref 4.6–6.5)

## 2016-04-19 LAB — BRAIN NATRIURETIC PEPTIDE: Pro B Natriuretic peptide (BNP): 47 pg/mL (ref 0.0–100.0)

## 2016-04-19 LAB — TSH: TSH: 1.45 u[IU]/mL (ref 0.35–4.50)

## 2016-04-22 ENCOUNTER — Encounter: Payer: Self-pay | Admitting: Family

## 2016-04-24 ENCOUNTER — Ambulatory Visit: Payer: Managed Care, Other (non HMO) | Admitting: Family

## 2016-04-24 ENCOUNTER — Encounter: Payer: Self-pay | Admitting: Family

## 2016-04-30 ENCOUNTER — Encounter: Payer: Self-pay | Admitting: Family

## 2016-04-30 ENCOUNTER — Ambulatory Visit (INDEPENDENT_AMBULATORY_CARE_PROVIDER_SITE_OTHER): Payer: Managed Care, Other (non HMO) | Admitting: Family

## 2016-04-30 VITALS — BP 125/55 | HR 63 | Temp 98.8°F | Resp 16 | Ht 68.0 in | Wt 224.6 lb

## 2016-04-30 DIAGNOSIS — I10 Essential (primary) hypertension: Secondary | ICD-10-CM

## 2016-04-30 NOTE — Progress Notes (Signed)
Pre visit review using our clinic review tool, if applicable. No additional management support is needed unless otherwise documented below in the visit note. 

## 2016-04-30 NOTE — Progress Notes (Signed)
Subjective:    Patient ID: Tanya Wilcox, female    DOB: 1970/08/18, 45 y.o.   MRN: [id number]  HPI  Ms. Sleigh is a 45 yr old female who presents today for follow up.  One week ago she presented with bilateral leg pain and swelling. She also noted a 10 pound weight gain in 1 week.  She also reported 2 week hx of fatigue.  She was placed on PRN lasix and sent for LE doppler and 2D echo.  CMET, CBC and TSH were unremarkable except for mild elevation of her platelets which is chronic for her. In addition BNP was WNL.  Bilateral LE doppler was negative for DVT.  2D echo has not yet been completed.She is taking lasix prn. She notes that she has taken about 6 doses since her last visit. Denies SOB, reports resolution of her swelling. She does report that she has had some leg cramping.   Wt Readings from Last 3 Encounters:  04/30/16 224 lb 9.6 oz (101.9 kg)  04/18/16 236 lb (107 kg)  01/23/16 227 lb 3.2 oz (103.1 kg)     Review of Systems See HPI  Past Medical History:  Diagnosis Date  . Alcohol use   . Anxiety   . Bipolar disorder ([place])   . Depression   . Gestational diabetes    borderline II  . Osteoarthritis    knee  . Tobacco abuse      Social History   Social History  . Marital status: Single    Spouse name: N/A  . Number of children: N/A  . 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 Smoking status: Current Every Day Smoker    Types: Cigarettes    Last attempt to quit: 01/20/2013  . Smokeless tobacco: Never Used     Comment: 1 pack cigarettes a week  . Alcohol use 0.0 oz/week     Comment: occasionally  . Drug use: No  . Sexual activity: Not on file   Other Topics Concern  . Not on file   Social History Narrative   Lives with daughter   Works at [employer]   Enjoys sleeping   1 dog   Completed high school, some college       Past Surgical History:  Procedure Laterality Date  .  CHOLECYSTECTOMY  2003    Family History  Problem Relation Age of Onset  . Hypertension Mother   . Hyperlipidemia Mother   . Alcohol abuse Father     No Known Allergies  Current Outpatient Prescriptions on File Prior to Visit  Medication Sig Dispense Refill  . aspirin EC 81 MG tablet Take 81 mg by mouth daily.    Marland Kitchen atorvastatin (LIPITOR) 40 MG tablet Take 1 tablet by mouth  daily 90 tablet 1  . furosemide (LASIX) 20 MG tablet Take 1 tablet (20 mg total) by mouth daily as needed for edema. 30 tablet 0  . glimepiride (AMARYL) 2 MG tablet Take 1 tablet (2 mg total) by mouth daily before breakfast. 90 tablet 1  . INVOKANA 300 MG TABS tablet TAKE 1 TABLET BY MOUTH  DAILY 90 tablet 1  . JANUVIA 100 MG tablet Take 1 tablet by mouth  daily 90 tablet 1  . levonorgestrel (MIRENA) 20 MCG/24HR IUD 1 each by Intrauterine route once.    Marland Kitchen lisinopril (PRINIVIL,ZESTRIL) 20 MG tablet Take 1 tablet by mouth  daily 90 tablet 1  . meloxicam (MOBIC) 7.5 MG tablet TAKE 1 TABLET(7.5 MG) BY MOUTH DAILY 14 tablet 0  . Multiple Vitamin (MULTIVITAMIN) tablet Take 1 tablet by mouth daily.    . ONE TOUCH ULTRA TEST test strip TEST 3 TIMES DAILY BEFORE  MEALS AS DIRECTED 270 each 1  . valACYclovir (VALTREX) 500 MG tablet Take 1 tablet by mouth  every day 90 tablet 1   No current facility-administered medications on file prior to visit.     BP (!) 125/55 (BP Location: Right Arm, Cuff Size: Large)   Pulse 63   Temp 98.8 F (37.1 C) (Oral)   Resp 16   Ht 5\' 8"  (1.727 m)   Wt 224 lb 9.6 oz (101.9 kg)   SpO2 100% Comment: room air  BMI 34.15 kg/m       Objective:   Physical Exam  Constitutional: She is oriented to person, place, and time. She appears well-developed and well-nourished.  Cardiovascular: Normal rate, regular rhythm and normal heart sounds.   No murmur heard. Pulmonary/Chest: Effort normal and breath sounds normal. No respiratory distress. She has no wheezes.  Musculoskeletal: She exhibits  no edema.  Neurological: She is alert and oriented to person, place, and time.  Psychiatric: She has a normal mood and affect. Her behavior is normal. Judgment and thought content normal.          Assessment & Plan:  Edema- resolved following initiation of lasix. Advised patient to continue lasix prn. Will obtain bmet to assess potassium.

## 2016-04-30 NOTE — Patient Instructions (Signed)
Please complete lab work prior to leaving.   

## 2016-04-30 NOTE — Addendum Note (Signed)
Addended by: Caffie Pinto on: 04/30/2016 09:11 AM   Modules accepted: Orders

## 2016-05-02 ENCOUNTER — Ambulatory Visit (HOSPITAL_BASED_OUTPATIENT_CLINIC_OR_DEPARTMENT_OTHER)
Admission: RE | Admit: 2016-05-02 | Discharge: 2016-05-02 | Disposition: A | Discharge status: Home / Self Care | Payer: Managed Care, Other (non HMO) | Source: Ambulatory Visit | Attending: Family | Admitting: Family

## 2016-05-02 DIAGNOSIS — R011 Cardiac murmur, unspecified: Secondary | ICD-10-CM

## 2016-05-02 DIAGNOSIS — I517 Cardiomegaly: Secondary | ICD-10-CM | POA: Insufficient documentation

## 2016-05-02 NOTE — Progress Notes (Signed)
  Echocardiogram 2D Echocardiogram has been performed.  Tresa Res 05/02/2016, 3:47 PM

## 2016-05-04 ENCOUNTER — Encounter: Payer: Self-pay | Admitting: Family

## 2016-05-07 NOTE — Telephone Encounter (Signed)
Form forwarded to PCP for signature. Please advise?

## 2016-07-30 ENCOUNTER — Other Ambulatory Visit: Payer: Self-pay | Admitting: Family

## 2016-08-20 ENCOUNTER — Telehealth: Payer: Self-pay | Admitting: Family

## 2016-08-20 NOTE — Telephone Encounter (Signed)
Patient is requesting a refill of FLUCONAZOLE 150mg   Please advise   Pharmacy: [facility], [place] - [address] AT [place]

## 2016-08-20 NOTE — Telephone Encounter (Signed)
Pt needs OV since the diflucan did not help. We will need to do additional testing please.

## 2016-08-20 NOTE — Telephone Encounter (Signed)
Spoke with pt. Reports vaginal itching and white discharge. Took 1 diflucan last Saturday and took 2nd dose 48 hours later. Continues to have slight itching.

## 2016-08-20 NOTE — Telephone Encounter (Signed)
Notified pt and scheduled appt for Wednesday at 7:45am with PCP.

## 2016-08-22 ENCOUNTER — Ambulatory Visit (INDEPENDENT_AMBULATORY_CARE_PROVIDER_SITE_OTHER): Payer: 59 | Admitting: Family

## 2016-08-22 ENCOUNTER — Other Ambulatory Visit (HOSPITAL_COMMUNITY)
Admission: RE | Admit: 2016-08-22 | Discharge: 2016-08-22 | Disposition: A | Discharge status: Home / Self Care | Payer: 59 | Source: Ambulatory Visit | Attending: Family | Admitting: Family

## 2016-08-22 ENCOUNTER — Encounter: Payer: Self-pay | Admitting: Family

## 2016-08-22 VITALS — BP 113/55 | HR 75 | Temp 98.3°F | Wt 229.6 lb

## 2016-08-22 DIAGNOSIS — N760 Acute vaginitis: Secondary | ICD-10-CM

## 2016-08-22 MED ORDER — FLUCONAZOLE 150 MG PO TABS: 150.0000 mg | ORAL_TABLET | Freq: Once | ORAL | 0 refills | Status: DC

## 2016-08-22 NOTE — Progress Notes (Signed)
Subjective:    Patient ID: Tanya Wilcox, female    DOB: Aug 03, 1970, 46 y.o.   MRN: [id number]  HPI  Tanya Wilcox is a 46 yr old female who presents today with chief complaint of vaginal itching.  She took a diflucan last Saturday (08/11/16)  followed by a second dose 2 days later.  Denies pelvic pain or fever. Discharge is mostly gone, just has mild itching. Reports a recent new sexual partner.   Review of Systems See HPI  Past Medical History:  Diagnosis Date  . Alcohol use   . Anxiety   . Bipolar disorder ([place])   . Depression   . Gestational diabetes    borderline II  . Osteoarthritis    knee  . Tobacco abuse      Social History   Social History  . Marital status: Single    Spouse name: N/A  . Number of children: N/A  . 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 Smoking status: Current Every Day Smoker    Types: Cigarettes    Last attempt to quit: 01/20/2013  . Smokeless tobacco: Never Used     Comment: 1 pack cigarettes a week  . Alcohol use 0.0 oz/week     Comment: occasionally  . Drug use: No  . Sexual activity: Not on file   Other Topics Concern  . Not on file   Social History Narrative   Lives with daughter   Works at [employer]   Enjoys sleeping   1 dog   Completed high school, some college       Past Surgical History:  Procedure Laterality Date  . CHOLECYSTECTOMY  2003    Family History  Problem Relation Age of Onset  . Hypertension Mother   . Hyperlipidemia Mother   . Alcohol abuse Father     No Known Allergies  Current Outpatient Prescriptions on File Prior to Visit  Medication Sig Dispense Refill  . aspirin EC 81 MG tablet Take 81 mg by mouth daily.    Marland Kitchen atorvastatin (LIPITOR) 40 MG tablet Take 1 tablet by mouth  daily 90 tablet 1  . furosemide (LASIX) 20 MG tablet TAKE 1 TABLET(20 MG) BY MOUTH DAILY AS NEEDED FOR SWELLING 30 tablet 0  . glimepiride (AMARYL) 2  MG tablet Take 1 tablet (2 mg total) by mouth daily before breakfast. 90 tablet 1  . INVOKANA 300 MG TABS tablet TAKE 1 TABLET BY MOUTH  DAILY 90 tablet 1  . JANUVIA 100 MG tablet Take 1 tablet by mouth  daily 90 tablet 1  . levonorgestrel (MIRENA) 20 MCG/24HR IUD 1 each by Intrauterine route once.    Marland Kitchen lisinopril (PRINIVIL,ZESTRIL) 20 MG tablet Take 1 tablet by mouth  daily 90 tablet 1  . meloxicam (MOBIC) 7.5 MG tablet TAKE 1 TABLET(7.5 MG) BY MOUTH DAILY 14 tablet 0  . Multiple Vitamin (MULTIVITAMIN) tablet Take 1 tablet by mouth daily.    . ONE TOUCH ULTRA TEST test strip TEST 3 TIMES DAILY BEFORE  MEALS AS DIRECTED 270 each 1  . valACYclovir (VALTREX) 500 MG tablet Take 1 tablet by mouth  every day 90 tablet 1   No current facility-administered medications on file prior to visit.     BP (!) 113/55 (BP Location: Right Arm, Cuff Size: Large)   Pulse 75   Temp 98.3 F (36.8 C) (Oral)   Wt 229  lb 9.6 oz (104.1 kg)   LMP  (Exact Date)   SpO2 100%   BMI 34.91 kg/m       Objective:   Physical Exam  Constitutional: She appears well-developed and well-nourished.  Cardiovascular: Normal rate, regular rhythm and normal heart sounds.   No murmur heard. Pulmonary/Chest: Effort normal and breath sounds normal. No respiratory distress. She has no wheezes.  Genitourinary: There is no rash on the right labia. There is no rash on the left labia. No erythema in the vagina. No vaginal discharge found.  Psychiatric: She has a normal mood and affect. Her behavior is normal. Judgment and thought content normal.          Assessment & Plan:  Vaginitis- suspect resolving vaginal candidiasis in setting of invokana use.  Will rx with 1 additional tablet of diflucan.  Wet prep and GC/Chlamydia probe obtained today.  Will give one additional tablet of diflucan while we wait on these results.  She is working on quitting smoking and I encouraged her to keep trying!

## 2016-08-22 NOTE — Patient Instructions (Signed)
Please take tablet of diflucan. Call if symptoms worsen or if symptoms are not improved in 2-3 days.

## 2016-08-22 NOTE — Addendum Note (Signed)
Addended by: Harl Bowie on: 08/22/2016 09:35 AM   Modules accepted: Orders

## 2016-08-22 NOTE — Progress Notes (Signed)
Pre visit review using our clinic review tool, if applicable. No additional management support is needed unless otherwise documented below in the visit note. 

## 2016-08-23 LAB — GC/CHLAMYDIA PROBE AMP
CT Probe RNA: NOT DETECTED
GC Probe RNA: NOT DETECTED

## 2016-08-24 ENCOUNTER — Telehealth: Payer: Self-pay | Admitting: Family

## 2016-08-24 LAB — CERVICOVAGINAL ANCILLARY ONLY: WET PREP (BD AFFIRM): POSITIVE — AB

## 2016-08-24 MED ORDER — METRONIDAZOLE 0.75 % VA GEL: 1.0000 | Freq: Every day | VAGINAL | 0 refills | Status: AC

## 2016-08-24 MED ORDER — METRONIDAZOLE 0.75 % VA GEL: 1.0000 | Freq: Every day | VAGINAL | 0 refills | Status: DC

## 2016-08-24 NOTE — Telephone Encounter (Signed)
Rx originally sent to [facility] and cancellation request was faxed to them and Rx was re-sent to [facility].

## 2016-08-24 NOTE — Telephone Encounter (Signed)
Attempted to notify pt and left message to check mychart. Message sent.

## 2016-08-24 NOTE — Telephone Encounter (Signed)
Wet prep shows bacterial vaginosis.  Rx sent for metrogel.

## 2016-08-30 ENCOUNTER — Telehealth: Payer: Self-pay | Admitting: Family

## 2016-08-30 NOTE — Telephone Encounter (Signed)
Caller name: Relationship to patient: Self Can be reached: [phone] Pharmacy:  [facility], [place] [phone] (Phone) [phone] (Fax)     Reason for call: Patient states she was suppose to have a Rx called in for her bacteria infection last week but it has not been called in. Plse adv

## 2016-08-31 NOTE — Telephone Encounter (Signed)
Spoke with pharmacist at [facility] and was told that they did received a metronidazole prescription but it was placed on hold as insurance said "it was too soon to fill" Rx. I could not see that we send a recent prescription for that. She states they will fill Rx today. Left detailed message on pt's voicemail to check with pharmacy this afternoon and let us know if she has any further questions.

## 2016-09-17 ENCOUNTER — Other Ambulatory Visit: Payer: Self-pay | Admitting: Family

## 2016-09-19 ENCOUNTER — Encounter: Payer: Self-pay | Admitting: Family

## 2016-09-19 ENCOUNTER — Other Ambulatory Visit: Payer: Self-pay | Admitting: Family

## 2016-09-19 MED ORDER — NICOTINE 21 MG/24HR TD PT24: 21.0000 mg | MEDICATED_PATCH | Freq: Every day | TRANSDERMAL | 0 refills | Status: DC

## 2016-09-26 LAB — HM DIABETES EYE EXAM

## 2016-10-12 ENCOUNTER — Encounter: Payer: Self-pay | Admitting: Family

## 2016-11-19 ENCOUNTER — Ambulatory Visit (INDEPENDENT_AMBULATORY_CARE_PROVIDER_SITE_OTHER): Payer: 59 | Admitting: Family

## 2016-11-19 ENCOUNTER — Encounter: Payer: Self-pay | Admitting: Family

## 2016-11-19 VITALS — BP 112/78 | HR 66 | Temp 98.3°F | Resp 18 | Ht 68.0 in | Wt 238.0 lb

## 2016-11-19 DIAGNOSIS — F419 Anxiety disorder, unspecified: Secondary | ICD-10-CM

## 2016-11-19 DIAGNOSIS — E785 Hyperlipidemia, unspecified: Secondary | ICD-10-CM

## 2016-11-19 DIAGNOSIS — IMO0001 Reserved for inherently not codable concepts without codable children: Secondary | ICD-10-CM

## 2016-11-19 DIAGNOSIS — I10 Essential (primary) hypertension: Secondary | ICD-10-CM | POA: Diagnosis not present

## 2016-11-19 DIAGNOSIS — Z794 Long term (current) use of insulin: Secondary | ICD-10-CM

## 2016-11-19 DIAGNOSIS — E119 Type 2 diabetes mellitus without complications: Secondary | ICD-10-CM

## 2016-11-19 DIAGNOSIS — E1165 Type 2 diabetes mellitus with hyperglycemia: Secondary | ICD-10-CM

## 2016-11-19 DIAGNOSIS — F329 Major depressive disorder, single episode, unspecified: Secondary | ICD-10-CM | POA: Diagnosis not present

## 2016-11-19 LAB — LIPID PANEL
Cholesterol: 135 mg/dL (ref 0–200)
HDL: 35.5 mg/dL — AB (ref >39.00)
LDL Cholesterol: 70 mg/dL (ref 0–99)
NonHDL: 99.79
TRIGLYCERIDES: 147 mg/dL (ref 0.0–149.0)
Total CHOL/HDL Ratio: 4
VLDL: 29.4 mg/dL (ref 0.0–40.0)

## 2016-11-19 LAB — BASIC METABOLIC PANEL
BUN: 11 mg/dL (ref 6–23)
CALCIUM: 9.4 mg/dL (ref 8.4–10.5)
CO2: 25 meq/L (ref 19–32)
CREATININE: 0.8 mg/dL (ref 0.40–1.20)
Chloride: 103 mEq/L (ref 96–112)
GFR: 99.46 mL/min (ref >60.00)
GLUCOSE: 153 mg/dL — AB (ref 70–99)
Potassium: 4.1 mEq/L (ref 3.5–5.1)
Sodium: 134 mEq/L — ABNORMAL LOW (ref 135–145)

## 2016-11-19 LAB — MICROALBUMIN / CREATININE URINE RATIO
Creatinine,U: 66.1 mg/dL
Microalb Creat Ratio: 1.1 mg/g (ref 0.0–30.0)
Microalb, Ur: <0.7 mg/dL (ref 0.0–1.9)

## 2016-11-19 LAB — HEPATIC FUNCTION PANEL
ALBUMIN: 3.9 g/dL (ref 3.5–5.2)
ALT: 24 U/L (ref 0–35)
AST: 16 U/L (ref 0–37)
Alkaline Phosphatase: 45 U/L (ref 39–117)
Bilirubin, Direct: 0.1 mg/dL (ref 0.0–0.3)
TOTAL PROTEIN: 6.7 g/dL (ref 6.0–8.3)
Total Bilirubin: 0.5 mg/dL (ref 0.2–1.2)

## 2016-11-19 LAB — HEMOGLOBIN A1C: Hgb A1c MFr Bld: 7.2 % — ABNORMAL HIGH (ref 4.6–6.5)

## 2016-11-19 NOTE — Progress Notes (Signed)
Pre visit review using our clinic review tool, if applicable. No additional management support is needed unless otherwise documented below in the visit note. 

## 2016-11-19 NOTE — Assessment & Plan Note (Signed)
Stable, management per psych 

## 2016-11-19 NOTE — Assessment & Plan Note (Signed)
Stable on lisinopril. Continue same

## 2016-11-19 NOTE — Assessment & Plan Note (Signed)
At goal on lipitor, continue same.  

## 2016-11-19 NOTE — Patient Instructions (Signed)
Please complete lab work prior to leaving.   

## 2016-11-19 NOTE — Progress Notes (Signed)
Subjective:    Patient ID: Tanya Wilcox, female    DOB: 13-Aug-1970, 46 y.o.   MRN: [id number]  HPI  Tanya Wilcox is a 46 yr old female who presents today for follow up.  1) HTN- maintained on lisinopril. Denies CP/SOB or swelling.  BP Readings from Last 3 Encounters:  11/19/16 112/78  08/22/16 (!) 113/55  04/30/16 (!) 125/55   2) Hyperlipidemia- maintained on lipitor. Lab Results  Component Value Date   CHOL 114 01/23/2016   HDL 38.40 (L) 01/23/2016   LDLCALC 64 01/23/2016   TRIG 60.0 01/23/2016   CHOLHDL 3 01/23/2016   3) DM2- on amaryl, invokana, januvia. Started weight watchers.   Lab Results  Component Value Date   HGBA1C 6.4 04/18/2016   HGBA1C 6.6 (H) 01/23/2016   HGBA1C 7.1 (A) 05/27/2015   Lab Results  Component Value Date   MICROALBUR <0.7 12/07/2014   LDLCALC 64 01/23/2016   CREATININE 0.89 04/18/2016   4) Anxiety/Depression- on abilify, zoloft, prn xanax. She sees T[facility] with [facility].  Reports overall well controlled.    Review of Systems See HPI  Past Medical History:  Diagnosis Date  . Alcohol use   . Anxiety   . Bipolar disorder ([place])   . Depression   . Gestational diabetes    borderline II  . Osteoarthritis    knee  . Tobacco abuse      Social History   Social History  . Marital status: Single    Spouse name: N/A  . Number of children: N/A  . 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 Smoking status: Current Every Day Smoker    Types: Cigarettes    Last attempt to quit: 01/20/2013  . Smokeless tobacco: Never Used     Comment: 1 pack cigarettes a week  . Alcohol use 0.0 oz/week     Comment: occasionally  . Drug use: No  . Sexual activity: Not on file   Other Topics Concern  . Not on file   Social History Narrative   Lives with daughter   Works at [employer]   Enjoys sleeping   1 dog   Completed high school, some college      Past Surgical History:  Procedure Laterality Date  . CHOLECYSTECTOMY  2003    Family History  Problem Relation Age of Onset  . Hypertension Mother   . Hyperlipidemia Mother   . Alcohol abuse Father     No Known Allergies  Current Outpatient Prescriptions on File Prior to Visit  Medication Sig Dispense Refill  . ALPRAZolam (XANAX) 0.5 MG tablet Take 0.5 mg by mouth 2 (two) times daily.  1  . aspirin EC 81 MG tablet Take 81 mg by mouth daily.    Marland Kitchen atorvastatin (LIPITOR) 40 MG tablet TAKE 1 TABLET BY MOUTH  DAILY 90 tablet 0  . furosemide (LASIX) 20 MG tablet TAKE 1 TABLET(20 MG) BY MOUTH DAILY AS NEEDED FOR SWELLING 30 tablet 0  . glimepiride (AMARYL) 2 MG tablet TAKE 1 TABLET BY MOUTH  DAILY BEFORE BREAKFAST 90 tablet 0  . INVOKANA 300 MG TABS tablet TAKE 1 TABLET BY MOUTH  DAILY 90 tablet 1  . JANUVIA 100 MG tablet Take 1 tablet by mouth  daily 90 tablet 1  . levonorgestrel (MIRENA) 20 MCG/24HR IUD 1 each by Intrauterine route once.    Marland Kitchen lisinopril (PRINIVIL,ZESTRIL) 20  MG tablet TAKE 1 TABLET BY MOUTH  DAILY 90 tablet 0  . meloxicam (MOBIC) 7.5 MG tablet TAKE 1 TABLET(7.5 MG) BY MOUTH DAILY 14 tablet 0  . Multiple Vitamin (MULTIVITAMIN) tablet Take 1 tablet by mouth daily.    . nicotine (NICODERM CQ) 21 mg/24hr patch Place 1 patch (21 mg total) onto the skin daily. 42 patch 0  . ONE TOUCH ULTRA TEST test strip TEST 3 TIMES DAILY BEFORE  MEALS AS DIRECTED 270 each 1  . sertraline (ZOLOFT) 25 MG tablet Take 25 mg by mouth daily.  1  . valACYclovir (VALTREX) 500 MG tablet TAKE 1 TABLET BY MOUTH  EVERY DAY 90 tablet 0   No current facility-administered medications on file prior to visit.     BP 112/78 (BP Location: Right Arm, Cuff Size: Large)   Pulse 66   Temp 98.3 F (36.8 C) (Oral)   Resp 18   Ht 5\' 8"  (1.727 m)   Wt 238 lb (108 kg)   SpO2 100% Comment: room air  BMI 36.19 kg/m       Objective:   Physical Exam  Constitutional: She is oriented to person, place,  and time. She appears well-developed and well-nourished.  HENT:  Head: Normocephalic and atraumatic.  Cardiovascular: Normal rate, regular rhythm and normal heart sounds.   No murmur heard. Pulmonary/Chest: Effort normal and breath sounds normal. No respiratory distress. She has no wheezes.  Musculoskeletal: She exhibits no edema.  Neurological: She is alert and oriented to person, place, and time.  Psychiatric: She has a normal mood and affect. Her behavior is normal. Judgment and thought content normal.          Assessment & Plan:

## 2016-11-19 NOTE — Assessment & Plan Note (Signed)
Not checking sugars regularly. I encouraged her to continue her work with [employer]. Continue current meds. Obtain follow up A1c.

## 2016-11-20 ENCOUNTER — Other Ambulatory Visit: Payer: Self-pay | Admitting: Family

## 2016-11-20 LAB — HM MAMMOGRAPHY: HM Mammogram: NORMAL (ref 0–4)

## 2016-11-20 LAB — HM PAP SMEAR: HM Pap smear: NORMAL

## 2016-11-20 MED ORDER — PIOGLITAZONE HCL 30 MG PO TABS
30.0000 mg | ORAL_TABLET | Freq: Every day | ORAL | 5 refills | Status: DC
Start: 2016-11-20 — End: 2017-02-08

## 2016-11-21 ENCOUNTER — Other Ambulatory Visit: Payer: Self-pay | Admitting: Family

## 2016-11-22 ENCOUNTER — Encounter: Payer: Self-pay | Admitting: Family

## 2016-11-22 NOTE — Telephone Encounter (Signed)
Refill sent per [facility] refill protocol/SLS  

## 2017-02-06 ENCOUNTER — Encounter: Payer: Self-pay | Admitting: Family

## 2017-02-08 ENCOUNTER — Encounter: Payer: Self-pay | Admitting: Family

## 2017-02-08 ENCOUNTER — Ambulatory Visit (INDEPENDENT_AMBULATORY_CARE_PROVIDER_SITE_OTHER): Payer: 59 | Admitting: Family

## 2017-02-08 VITALS — BP 118/59 | HR 72 | Temp 98.4°F | Resp 16 | Ht 68.0 in | Wt 236.8 lb

## 2017-02-08 DIAGNOSIS — IMO0001 Reserved for inherently not codable concepts without codable children: Secondary | ICD-10-CM

## 2017-02-08 DIAGNOSIS — E1165 Type 2 diabetes mellitus with hyperglycemia: Secondary | ICD-10-CM

## 2017-02-08 DIAGNOSIS — R609 Edema, unspecified: Secondary | ICD-10-CM | POA: Diagnosis not present

## 2017-02-08 MED ORDER — INSULIN PEN NEEDLE 31G X 8 MM MISC: 0 refills | Status: DC

## 2017-02-08 MED ORDER — BASAGLAR KWIKPEN 100 UNIT/ML ~~LOC~~ SOPN: 10.0000 [IU] | PEN_INJECTOR | Freq: Every day | SUBCUTANEOUS | 5 refills | Status: DC

## 2017-02-08 MED ORDER — BASAGLAR KWIKPEN 100 UNIT/ML ~~LOC~~ SOPN: 10.0000 [IU] | PEN_INJECTOR | Freq: Every day | SUBCUTANEOUS | 0 refills | Status: DC

## 2017-02-08 MED ORDER — FUROSEMIDE 20 MG PO TABS: ORAL_TABLET | ORAL | 5 refills | Status: DC

## 2017-02-08 NOTE — Patient Instructions (Addendum)
Stop amaryl.   Begin basaglar 10 units once daily  OK to remain off of actos. Start lasix once daily as needed (take on the days you work).   Try purchasing over the counter knee high compression stockings to wear while you work.

## 2017-02-08 NOTE — Progress Notes (Signed)
Subjective:    Patient ID: Tanya Wilcox, female    DOB: 08/10/1970, 46 y.o.   MRN: [id number]  HPI   DM2- pt reports that her sugars have been running >200 for a few weeks she discontinued invokana (secondary to vaginal yeast infections). She is maintained on amaryl, [place]. Never started actos.   Lab Results  Component Value Date   HGBA1C 7.2 (H) 11/19/2016   HGBA1C 6.4 04/18/2016   HGBA1C 6.6 (H) 01/23/2016   Lab Results  Component Value Date   MICROALBUR <0.7 11/19/2016   [place] 70 11/19/2016   CREATININE 0.80 11/19/2016   She does report that she has had some edema in both feet/ankles about 2 weeks ago. Reports that she works 2 jobs and is on her feet.  Feet "stayed swollen.  Concerned about the darknees  In her LE .  Denies SOB or chest pain.    Review of Systems See HPI  Past Medical History:  Diagnosis Date  . Alcohol use   . Anxiety   . Bipolar disorder ([place])   . Depression   . Gestational diabetes    borderline II  . Osteoarthritis    knee  . Tobacco abuse      Social History   Social History  . Marital status: Single    Spouse name: N/A  . Number of children: N/A  . 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 Smoking status: Current Every Day Smoker    Types: Cigarettes    Last attempt to quit: 01/20/2013  . Smokeless tobacco: Never Used     Comment: 1 pack cigarettes a week  . Alcohol use 0.0 oz/week     Comment: occasionally  . Drug use: No  . Sexual activity: Not on file   Other Topics Concern  . Not on file   Social History Narrative   Lives with daughter   Works at [employer]   Enjoys sleeping   1 dog   Completed high school, some college       Past Surgical History:  Procedure Laterality Date  . CHOLECYSTECTOMY  2003    Family History  Problem Relation Age of Onset  . Hypertension Mother   . Hyperlipidemia Mother   . Alcohol abuse Father     No  Known Allergies  Current Outpatient Prescriptions on File Prior to Visit  Medication Sig Dispense Refill  . ALPRAZolam (XANAX) 0.5 MG tablet Take 0.5 mg by mouth 2 (two) times daily.  1  . ARIPiprazole (ABILIFY) 5 MG tablet Take 1 tablet by mouth daily.  1  . aspirin EC 81 MG tablet Take 81 mg by mouth daily.    Marland Kitchen atorvastatin (LIPITOR) 40 MG tablet TAKE 1 TABLET BY MOUTH  DAILY 90 tablet 1  . furosemide (LASIX) 20 MG tablet TAKE 1 TABLET(20 MG) BY MOUTH DAILY AS NEEDED FOR SWELLING 30 tablet 0  . glimepiride (AMARYL) 2 MG tablet TAKE 1 TABLET BY MOUTH  DAILY BEFORE BREAKFAST 90 tablet 1  . glucose blood (ONE TOUCH ULTRA TEST) test strip TEST [3] TIMES DAILY BEFORE MEALS AS DIRECTED Dx: E11.65 270 each 1  . hydrOXYzine (VISTARIL) 25 MG capsule Take 25 mg by mouth at bedtime as needed for sleep.  1  . JANUVIA 100 MG tablet TAKE 1 TABLET BY MOUTH  DAILY 90 tablet 1  . levonorgestrel (MIRENA) 20 MCG/24HR IUD 1 each  by Intrauterine route once.    Marland Kitchen lisinopril (PRINIVIL,ZESTRIL) 20 MG tablet TAKE 1 TABLET BY MOUTH  DAILY 90 tablet 0  . meloxicam (MOBIC) 7.5 MG tablet TAKE 1 TABLET(7.5 MG) BY MOUTH DAILY 14 tablet 0  . Multiple Vitamin (MULTIVITAMIN) tablet Take 1 tablet by mouth daily.    . nicotine (NICODERM CQ - DOSED IN MG/24 HOURS) 21 mg/24hr patch APPLY 1 PATCH ONTO SKIN  DAILY 42 patch 1  . sertraline (ZOLOFT) 25 MG tablet Take 25 mg by mouth daily.  1  . valACYclovir (VALTREX) 500 MG tablet TAKE 1 TABLET BY MOUTH  EVERY DAY 90 tablet 1  . INVOKANA 300 MG TABS tablet TAKE 1 TABLET BY MOUTH  DAILY (Patient not taking: Reported on 02/08/2017) 90 tablet 1  . pioglitazone (ACTOS) 30 MG tablet Take 1 tablet (30 mg total) by mouth daily. (Patient not taking: Reported on 02/08/2017) 30 tablet 5   No current facility-administered medications on file prior to visit.     BP (!) 118/59 (BP Location: Left Arm, Cuff Size: Large)   Pulse 72   Temp 98.4 F (36.9 C) (Oral)   Resp 16   Ht 5\' 8"  (1.727  m)   Wt 236 lb 12.8 oz (107.4 kg)   SpO2 98%   BMI 36.01 kg/m       Objective:   Physical Exam  Constitutional: She is oriented to person, place, and time. She appears well-developed and well-nourished.  Cardiovascular: Normal rate, regular rhythm and normal heart sounds.   No murmur heard. Pulmonary/Chest: Effort normal and breath sounds normal. No respiratory distress. She has no wheezes.  Musculoskeletal:  2+ bilateral LE edema noted  Neurological: She is alert and oriented to person, place, and time.  Psychiatric: She has a normal mood and affect. Her behavior is normal. Judgment and thought content normal.          Assessment & Plan:  Edema- I advised pt as follows:  Start lasix once daily as needed (take on the days you work).   Try purchasing over the counter knee high compression stockings to wear while you work.    DM2- uncontrolled.  Stop amaryl.   Begin basaglar 10 units once daily. Pt reports she is comfortable injecting insulin because she used insulin during pregnancy.

## 2017-02-22 ENCOUNTER — Encounter: Payer: Self-pay | Admitting: Family

## 2017-02-24 ENCOUNTER — Other Ambulatory Visit: Payer: Self-pay | Admitting: Family

## 2017-02-24 MED ORDER — BASAGLAR KWIKPEN 100 UNIT/ML ~~LOC~~ SOPN: 20.0000 [IU] | PEN_INJECTOR | Freq: Every day | SUBCUTANEOUS | 5 refills | Status: DC

## 2017-02-26 ENCOUNTER — Encounter: Payer: Self-pay | Admitting: Family

## 2017-02-27 NOTE — Telephone Encounter (Signed)
Patient checking on the status of message below, please advise °

## 2017-02-27 NOTE — Telephone Encounter (Signed)
Spoke with patient, patient states she is running low on her Insulin, and she need the refill to go to [facility]. I checked and verified with patient that an order for 1 pen 5 refill was sent to [facility] on 02/24/17. Patient state she will call pharmacy to track her Rx. She state she will call the office if she have further questions. LB

## 2017-03-01 MED ORDER — BASAGLAR KWIKPEN 100 UNIT/ML ~~LOC~~ SOPN: 20.0000 [IU] | PEN_INJECTOR | Freq: Every day | SUBCUTANEOUS | 5 refills | Status: DC

## 2017-03-01 NOTE — Telephone Encounter (Signed)
Rx sent to the [facility] on [address].

## 2017-03-04 ENCOUNTER — Telehealth: Payer: Self-pay | Admitting: *Deleted

## 2017-03-04 MED ORDER — BASAGLAR KWIKPEN 100 UNIT/ML ~~LOC~~ SOPN: 20.0000 [IU] | PEN_INJECTOR | Freq: Every day | SUBCUTANEOUS | 1 refills | Status: DC

## 2017-03-04 NOTE — Telephone Encounter (Signed)
Received fax from [facility] requesting rx for Bydureon. Spoke with [place], [place] and was advised that they are requesting bydureon because that is what pt requested. Advised her we had attempted to send basaglar on 02/24/17 but she states they never received it. Upon review of EPIC, it appears that rx did not transmit electronically. Left detailed message for pt that we are re-sending basaglar to mail order and apologized for any confusion and to let us know if she was not able to get Rx at local pharmacy last week or if she has any further questions. Rx sent.

## 2017-03-07 ENCOUNTER — Telehealth: Payer: Self-pay | Admitting: Family

## 2017-03-07 NOTE — Telephone Encounter (Signed)
°  [place], [place] [phone] (Phone) [phone] (Fax)     Reason for call:  [facility] requesting 90 day supply for Insulin Pen Needle (B-D ULTRAFINE III SHORT PEN) 31G X 8 MM MISC, requesting verbal orders. Reference telephone note [id number]

## 2017-03-08 MED ORDER — INSULIN PEN NEEDLE 31G X 8 MM MISC: 1 refills | Status: DC

## 2017-03-08 NOTE — Telephone Encounter (Signed)
Rx sent 

## 2017-03-22 ENCOUNTER — Ambulatory Visit (INDEPENDENT_AMBULATORY_CARE_PROVIDER_SITE_OTHER): Payer: 59 | Admitting: Family

## 2017-03-22 ENCOUNTER — Encounter: Payer: Self-pay | Admitting: Family

## 2017-03-22 VITALS — BP 120/80 | HR 68 | Temp 98.5°F | Resp 16 | Ht 68.0 in | Wt 242.0 lb

## 2017-03-22 DIAGNOSIS — I10 Essential (primary) hypertension: Secondary | ICD-10-CM

## 2017-03-22 DIAGNOSIS — E1165 Type 2 diabetes mellitus with hyperglycemia: Secondary | ICD-10-CM

## 2017-03-22 DIAGNOSIS — IMO0001 Reserved for inherently not codable concepts without codable children: Secondary | ICD-10-CM

## 2017-03-22 MED ORDER — BASAGLAR KWIKPEN 100 UNIT/ML ~~LOC~~ SOPN: 20.0000 [IU] | PEN_INJECTOR | Freq: Every day | SUBCUTANEOUS | 0 refills | Status: DC

## 2017-03-22 NOTE — Assessment & Plan Note (Signed)
Uncontrolled. Please increase basaglar to 30 units once daily for 1 week. Then by 2 units every 3 days until your fasting sugar is <140. Once your fasting sugar is 140 or less, continue that dose.  Then send me a mychart message to let me know how many units you are taking.    You can call [facility] and see if copay is cheaper than basaglar for  Levemir, lantus or toujeo.

## 2017-03-22 NOTE — Assessment & Plan Note (Signed)
bp stable on lisinopril. Continue same. Edema is stable. Will obtain follow up bmet.

## 2017-03-22 NOTE — Progress Notes (Signed)
Subjective:    Patient ID: Tanya Wilcox, female    DOB: 1971-01-14, 46 y.o.   MRN: [id number]  HPI  Tanya Wilcox is a 46 yr old female who presents today for follow up.  DM2- last visit we added Basaglar 20 units.  Amaryl was discontinued. She was continued on januvia. Reports that her AM fasting sugars are between 250 and 280. Had A1C done through employer= 9.3.  Lab Results  Component Value Date   HGBA1C 7.2 (H) 11/19/2016   HGBA1C 6.4 04/18/2016   HGBA1C 6.6 (H) 01/23/2016   Lab Results  Component Value Date   MICROALBUR <0.7 11/19/2016   LDLCALC 70 11/19/2016   CREATININE 0.80 11/19/2016   Edema- last visit lasix was added as needed.  She is only working one job now.  Uses lasix prn with good improvement.   HTN- maintained on lisinopril.   BP Readings from Last 3 Encounters:  03/22/17 120/80  02/08/17 (!) 118/59  11/19/16 112/78   Hyperlipidemia-  Lab Results  Component Value Date   CHOL 135 11/19/2016   HDL 35.50 (L) 11/19/2016   LDLCALC 70 11/19/2016   TRIG 147.0 11/19/2016   CHOLHDL 4 11/19/2016     Review of Systems    see HPI  Past Medical History:  Diagnosis Date  . Alcohol use   . Anxiety   . Bipolar disorder ([place])   . Depression   . Gestational diabetes    borderline II  . Osteoarthritis    knee  . Tobacco abuse      Social History   Social History  . Marital status: Single    Spouse name: N/A  . Number of children: N/A  . 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 Smoking status: Current Every Day Smoker    Types: Cigarettes    Last attempt to quit: 01/20/2013  . Smokeless tobacco: Never Used     Comment: 1 pack cigarettes a week  . Alcohol use 0.0 oz/week     Comment: occasionally  . Drug use: No  . Sexual activity: Not on file   Other Topics Concern  . Not on file   Social History Narrative   Lives with daughter   Works at [employer]   Enjoys  sleeping   1 dog   Completed high school, some college       Past Surgical History:  Procedure Laterality Date  . CHOLECYSTECTOMY  2003    Family History  Problem Relation Age of Onset  . Hypertension Mother   . Hyperlipidemia Mother   . Alcohol abuse Father     Allergies  Allergen Reactions  . Invokana [Canagliflozin] Other (See Comments)    Recurrent Yeast infections    Current Outpatient Prescriptions on File Prior to Visit  Medication Sig Dispense Refill  . ALPRAZolam (XANAX) 0.5 MG tablet Take 0.5 mg by mouth 2 (two) times daily.  1  . ARIPiprazole (ABILIFY) 5 MG tablet Take 1 tablet by mouth daily.  1  . aspirin EC 81 MG tablet Take 81 mg by mouth daily.    Marland Kitchen atorvastatin (LIPITOR) 40 MG tablet TAKE 1 TABLET BY MOUTH  DAILY 90 tablet 1  . furosemide (LASIX) 20 MG tablet TAKE 1 TABLET(20 MG) BY MOUTH DAILY AS NEEDED FOR SWELLING 30 tablet 5  . glucose blood (ONE TOUCH ULTRA TEST) test strip TEST [3] TIMES DAILY BEFORE  MEALS AS DIRECTED Dx: E11.65 270 each 1  . hydrOXYzine (VISTARIL) 25 MG capsule Take 25 mg by mouth at bedtime as needed for sleep.  1  . Insulin Pen Needle (B-D ULTRAFINE III SHORT PEN) 31G X 8 MM MISC Use to inject insulin once daily. 100 each 1  . JANUVIA 100 MG tablet TAKE 1 TABLET BY MOUTH  DAILY 90 tablet 1  . levonorgestrel (MIRENA) 20 MCG/24HR IUD 1 each by Intrauterine route once.    Marland Kitchen lisinopril (PRINIVIL,ZESTRIL) 20 MG tablet TAKE 1 TABLET BY MOUTH  DAILY 90 tablet 0  . Multiple Vitamin (MULTIVITAMIN) tablet Take 1 tablet by mouth daily.    . nicotine (NICODERM CQ - DOSED IN MG/24 HOURS) 21 mg/24hr patch APPLY 1 PATCH ONTO SKIN  DAILY 42 patch 1  . sertraline (ZOLOFT) 25 MG tablet Take 25 mg by mouth daily.  1  . valACYclovir (VALTREX) 500 MG tablet TAKE 1 TABLET BY MOUTH  EVERY DAY 90 tablet 1   No current facility-administered medications on file prior to visit.     BP 120/80 (BP Location: Right Arm, Cuff Size: Large)   Pulse 68   Temp  98.5 F (36.9 C) (Oral)   Resp 16   Ht 5\' 8"  (1.727 m)   Wt 242 lb (109.8 kg)   SpO2 100%   BMI 36.80 kg/m    Objective:   Physical Exam  Constitutional: She is oriented to person, place, and time. She appears well-developed and well-nourished.  Cardiovascular: Normal rate, regular rhythm and normal heart sounds.   No murmur heard. Pulmonary/Chest: Effort normal and breath sounds normal. No respiratory distress. She has no wheezes.  Musculoskeletal:  1+ bilateral LE edema  Neurological: She is alert and oriented to person, place, and time.  Psychiatric: She has a normal mood and affect. Her behavior is normal. Judgment and thought content normal.          Assessment & Plan:

## 2017-03-22 NOTE — Patient Instructions (Addendum)
Please increase basaglar to 30 units once daily for 1 week. Then by 2 units every 3 days until your fast sugar is <140. Once your fasting sugar is 140 or less, continue that dose.  Then send me a mychart message to let me know how many units you are taking.    You can call [facility] and see if copay is cheaper than basaglar for  Levemir, lantus or toujeo.  Complete lab work prior to leaving.

## 2017-03-22 NOTE — Assessment & Plan Note (Signed)
LDL at goal on most recent lab work from her job. Continue statin.

## 2017-03-23 LAB — BASIC METABOLIC PANEL WITH GFR
BUN/Creatinine Ratio: 19 (ref 9–23)
BUN: 15 mg/dL (ref 6–24)
CO2: 25 mmol/L (ref 20–29)
Calcium: 9.4 mg/dL (ref 8.7–10.2)
Chloride: 102 mmol/L (ref 96–106)
Creatinine, Ser: 0.81 mg/dL (ref 0.57–1.00)
GFR calc Af Amer: 101 mL/min/1.73
GFR calc non Af Amer: 87 mL/min/1.73
Glucose: 293 mg/dL — ABNORMAL HIGH (ref 65–99)
Potassium: 4.8 mmol/L (ref 3.5–5.2)
Sodium: 138 mmol/L (ref 134–144)

## 2017-03-25 ENCOUNTER — Other Ambulatory Visit: Payer: Self-pay | Admitting: Family

## 2017-03-25 MED ORDER — INSULIN GLARGINE 100 UNIT/ML SOLOSTAR PEN: 20.0000 [IU] | PEN_INJECTOR | Freq: Every day | SUBCUTANEOUS | 5 refills | Status: DC

## 2017-03-26 ENCOUNTER — Other Ambulatory Visit: Payer: Self-pay | Admitting: *Deleted

## 2017-03-26 MED ORDER — INSULIN GLARGINE 100 UNIT/ML SOLOSTAR PEN: 20.0000 [IU] | PEN_INJECTOR | Freq: Every day | SUBCUTANEOUS | 1 refills | Status: DC

## 2017-03-26 NOTE — Progress Notes (Signed)
Faxed refill request received from [facility] for Lantus Solostar Last filled by MD to local pharmacy, order cancelled Last AEX - 03/22/17 Refill sent per [facility] refill protocol/SLS

## 2017-04-23 ENCOUNTER — Other Ambulatory Visit: Payer: Self-pay | Admitting: Family

## 2017-04-25 ENCOUNTER — Other Ambulatory Visit: Payer: Self-pay | Admitting: Family

## 2017-05-06 ENCOUNTER — Other Ambulatory Visit: Payer: Self-pay | Admitting: Family

## 2017-06-07 ENCOUNTER — Other Ambulatory Visit: Payer: Self-pay | Admitting: Family

## 2017-06-21 ENCOUNTER — Encounter: Payer: Self-pay | Admitting: Family

## 2017-06-21 ENCOUNTER — Ambulatory Visit: Payer: 59 | Admitting: Family

## 2017-06-21 VITALS — BP 110/78 | HR 86 | Temp 98.5°F | Resp 16 | Ht 68.0 in | Wt 245.2 lb

## 2017-06-21 DIAGNOSIS — I10 Essential (primary) hypertension: Secondary | ICD-10-CM | POA: Diagnosis not present

## 2017-06-21 DIAGNOSIS — Z23 Encounter for immunization: Secondary | ICD-10-CM

## 2017-06-21 DIAGNOSIS — E785 Hyperlipidemia, unspecified: Secondary | ICD-10-CM | POA: Diagnosis not present

## 2017-06-21 DIAGNOSIS — F17200 Nicotine dependence, unspecified, uncomplicated: Secondary | ICD-10-CM

## 2017-06-21 DIAGNOSIS — E1165 Type 2 diabetes mellitus with hyperglycemia: Secondary | ICD-10-CM | POA: Diagnosis not present

## 2017-06-21 DIAGNOSIS — E119 Type 2 diabetes mellitus without complications: Secondary | ICD-10-CM

## 2017-06-21 DIAGNOSIS — Z794 Long term (current) use of insulin: Secondary | ICD-10-CM | POA: Diagnosis not present

## 2017-06-21 LAB — BASIC METABOLIC PANEL
BUN: 18 mg/dL (ref 6–23)
CALCIUM: 10 mg/dL (ref 8.4–10.5)
CO2: 28 mEq/L (ref 19–32)
Chloride: 97 mEq/L (ref 96–112)
Creatinine, Ser: 0.96 mg/dL (ref 0.40–1.20)
GFR: 80.38 mL/min (ref >60.00)
Glucose, Bld: 280 mg/dL — ABNORMAL HIGH (ref 70–99)
POTASSIUM: 4.6 meq/L (ref 3.5–5.1)
SODIUM: 133 meq/L — AB (ref 135–145)

## 2017-06-21 LAB — HEMOGLOBIN A1C: HEMOGLOBIN A1C: 8.5 % — AB (ref 4.6–6.5)

## 2017-06-21 NOTE — Assessment & Plan Note (Signed)
Still smoking, she will try patch again- reinforced importance of cessation.

## 2017-06-21 NOTE — Patient Instructions (Addendum)
Please complete lab work prior to leaving. Follow up in 3 months.  

## 2017-06-21 NOTE — Progress Notes (Signed)
Subjective:    Patient ID: Tanya Wilcox, female    DOB: Nov 04, 1970, 46 y.o.   MRN: [id number]  HPI  Tanya Wilcox is a 46 yr old female who presents today for follow up.  1) DM2-  Maintained on lantus 45 units once daily. Januvia. Reports fasting sugar around 160's.   Lab Results  Component Value Date   HGBA1C 7.2 (H) 11/19/2016   HGBA1C 6.4 04/18/2016   HGBA1C 6.6 (H) 01/23/2016   Lab Results  Component Value Date   MICROALBUR <0.7 11/19/2016   LDLCALC 70 11/19/2016   CREATININE 0.81 03/22/2017   2) HTN- mainatined on lisinopril 20mg , lasix.  BP Readings from Last 3 Encounters:  06/21/17 110/78  03/22/17 120/80  02/08/17 (!) 118/59   3) Hyperlipidemia- maintained on atorvastatin. Reports that she has bene walking and this is helping her blood sugar.  Lab Results  Component Value Date   CHOL 135 11/19/2016   HDL 35.50 (L) 11/19/2016   LDLCALC 70 11/19/2016   TRIG 147.0 11/19/2016   CHOLHDL 4 11/19/2016     Review of Systems    see HPI  Past Medical History:  Diagnosis Date  . Alcohol use   . Anxiety   . Bipolar disorder ([place])   . Depression   . Gestational diabetes    borderline II  . Osteoarthritis    knee  . Tobacco abuse      Social History   Socioeconomic History  . Marital status: Single    Spouse name: Not on file  . Number of children: Not on file  . Years of education: Not on file  . Highest education level: Not on file  Social Needs  . Financial resource strain: Not on file  . Food insecurity - worry: Not on file  . Food insecurity - inability: Not on file  . Transportation needs - medical: Not on file  . Transportation needs - non-medical: Not on file  Occupational History  . Occupation: Sport and exercise psychologist: [employer]  Tobacco Use  . Smoking status: Current Every Day Smoker    Types: Cigarettes    Last attempt to quit: 01/20/2013    Years since quitting: 4.4  . Smokeless tobacco: Never Used  . Tobacco comment: 1 pack cigarettes a  week  Substance and Sexual Activity  . Alcohol use: Yes    Alcohol/week: 0.0 oz    Comment: occasionally  . Drug use: No  . Sexual activity: Not on file  Other Topics Concern  . Not on file  Social History Narrative   Lives with daughter   Works at [employer]   Enjoys sleeping   1 dog   Completed high school, some college    Past Surgical History:  Procedure Laterality Date  . CHOLECYSTECTOMY  2003    Family History  Problem Relation Age of Onset  . Hypertension Mother   . Hyperlipidemia Mother   . Alcohol abuse Father     Allergies  Allergen Reactions  . Invokana [Canagliflozin] Other (See Comments)    Recurrent Yeast infections    Current Outpatient Medications on File Prior to Visit  Medication Sig Dispense Refill  . ALPRAZolam (XANAX) 0.5 MG tablet Take 0.5 mg by mouth 2 (two) times daily.  1  . ARIPiprazole (ABILIFY) 5 MG tablet Take 1 tablet by mouth daily.  1  . aspirin EC 81 MG tablet Take 81 mg by mouth daily.    Marland Kitchen atorvastatin (LIPITOR)  40 MG tablet TAKE 1 TABLET BY MOUTH  DAILY 90 tablet 1  . furosemide (LASIX) 20 MG tablet TAKE 1 TABLET(20 MG) BY MOUTH DAILY AS NEEDED FOR SWELLING 30 tablet 5  . glucose blood (ONE TOUCH ULTRA TEST) test strip TEST [3] TIMES DAILY BEFORE MEALS AS DIRECTED Dx: E11.65 270 each 1  . hydrOXYzine (VISTARIL) 25 MG capsule Take 25 mg by mouth at bedtime as needed for sleep.  1  . Insulin Glargine (LANTUS SOLOSTAR) 100 UNIT/ML Solostar Pen Inject 20 Units into the skin daily at 10 pm. (Patient taking differently: Inject 45 Units into the skin daily at 10 pm. ) 6 pen 1  . Insulin Pen Needle (B-D ULTRAFINE III SHORT PEN) 31G X 8 MM MISC Use to inject insulin once daily. 100 each 1  . JANUVIA 100 MG tablet TAKE 1 TABLET BY MOUTH  DAILY 90 tablet 1  . levonorgestrel (MIRENA) 20 MCG/24HR IUD 1 each by Intrauterine route once.    Marland Kitchen lisinopril (PRINIVIL,ZESTRIL) 20 MG tablet TAKE 1 TABLET BY MOUTH  DAILY 90 tablet 1  .  Multiple Vitamin (MULTIVITAMIN) tablet Take 1 tablet by mouth daily.    . nicotine (NICODERM CQ - DOSED IN MG/24 HOURS) 21 mg/24hr patch APPLY 1 PATCH ONTO SKIN  DAILY 42 patch 1  . sertraline (ZOLOFT) 25 MG tablet Take 25 mg by mouth daily.  1  . valACYclovir (VALTREX) 500 MG tablet TAKE 1 TABLET BY MOUTH  EVERY DAY 90 tablet 1   No current facility-administered medications on file prior to visit.     BP 110/78   Pulse 86   Temp 98.5 F (36.9 C) (Oral)   Resp 16   Ht 5\' 8"  (1.727 m)   Wt 245 lb 3.2 oz (111.2 kg)   SpO2 100%   BMI 37.28 kg/m    Objective:   Physical Exam  Constitutional: She is oriented to person, place, and time. She appears well-developed and well-nourished.  HENT:  Head: Normocephalic and atraumatic.  Cardiovascular: Normal rate, regular rhythm and normal heart sounds.  No murmur heard. Pulmonary/Chest: Effort normal and breath sounds normal. No respiratory distress. She has no wheezes.  Musculoskeletal: She exhibits no edema.  Neurological: She is alert and oriented to person, place, and time.  Psychiatric: She has a normal mood and affect. Her behavior is normal. Judgment and thought content normal.          Assessment & Plan:  Flu shot today.

## 2017-06-21 NOTE — Assessment & Plan Note (Signed)
Control is improving, continue current dosing of lantus/januvia, obtain follow up A1c.

## 2017-06-21 NOTE — Assessment & Plan Note (Addendum)
Stable on current medications. Continue same.

## 2017-06-21 NOTE — Assessment & Plan Note (Signed)
LDL at goal.  Continue statin.   

## 2017-06-24 ENCOUNTER — Telehealth: Payer: Self-pay | Admitting: Family

## 2017-06-24 MED ORDER — INSULIN GLARGINE 100 UNIT/ML SOLOSTAR PEN: 50.0000 [IU] | PEN_INJECTOR | Freq: Every day | SUBCUTANEOUS | 1 refills | Status: DC

## 2017-06-24 NOTE — Telephone Encounter (Signed)
Notified pt and she voices understanding. 

## 2017-06-24 NOTE — Telephone Encounter (Signed)
a1c is above goal.  Increase lantus to 50 units SQ daily. Check fasting sugars once daily and email me readings via mychart in 1 week.

## 2017-07-02 ENCOUNTER — Encounter: Payer: Self-pay | Admitting: Family

## 2017-07-08 ENCOUNTER — Other Ambulatory Visit: Payer: Self-pay | Admitting: Family

## 2017-08-02 ENCOUNTER — Encounter: Payer: Self-pay | Admitting: Family

## 2017-08-05 MED ORDER — INSULIN GLARGINE 100 UNIT/ML SOLOSTAR PEN: 80.0000 [IU] | PEN_INJECTOR | Freq: Every day | SUBCUTANEOUS | 1 refills | Status: DC

## 2017-09-20 ENCOUNTER — Encounter: Payer: Self-pay | Admitting: Family

## 2017-09-20 ENCOUNTER — Ambulatory Visit: Payer: Managed Care, Other (non HMO) | Admitting: Family

## 2017-09-20 ENCOUNTER — Other Ambulatory Visit: Payer: Self-pay | Admitting: Family

## 2017-09-20 VITALS — BP 130/54 | HR 67 | Temp 98.6°F | Resp 16 | Ht 68.0 in | Wt 242.4 lb

## 2017-09-20 DIAGNOSIS — E1165 Type 2 diabetes mellitus with hyperglycemia: Secondary | ICD-10-CM

## 2017-09-20 DIAGNOSIS — Z720 Tobacco use: Secondary | ICD-10-CM

## 2017-09-20 DIAGNOSIS — E785 Hyperlipidemia, unspecified: Secondary | ICD-10-CM

## 2017-09-20 DIAGNOSIS — I10 Essential (primary) hypertension: Secondary | ICD-10-CM

## 2017-09-20 LAB — HM DIABETES EYE EXAM

## 2017-09-20 MED ORDER — FUROSEMIDE 20 MG PO TABS: ORAL_TABLET | ORAL | 1 refills | Status: DC

## 2017-09-20 MED ORDER — SITAGLIPTIN PHOSPHATE 100 MG PO TABS: 100.0000 mg | ORAL_TABLET | Freq: Every day | ORAL | 1 refills | Status: DC

## 2017-09-20 MED ORDER — ATORVASTATIN CALCIUM 40 MG PO TABS: 40.0000 mg | ORAL_TABLET | Freq: Every day | ORAL | 1 refills | Status: DC

## 2017-09-20 MED ORDER — LISINOPRIL 20 MG PO TABS: 20.0000 mg | ORAL_TABLET | Freq: Every day | ORAL | 1 refills | Status: DC

## 2017-09-20 NOTE — Progress Notes (Signed)
Subjective:    Patient ID: Tanya Wilcox, female    DOB: 1971/04/29, 47 y.o.   MRN: [id number]  HPI  HTN-  BP Readings from Last 3 Encounters:  09/20/17 (!) 130/54  06/21/17 110/78  03/22/17 120/80   DM2- on lantus, januvia, started going to the gym.  Just started changes her eating habits.   Lab Results  Component Value Date   HGBA1C 8.5 (H) 06/21/2017   HGBA1C 7.2 (H) 11/19/2016   HGBA1C 6.4 04/18/2016   Lab Results  Component Value Date   MICROALBUR <0.7 11/19/2016   LDLCALC 70 11/19/2016   CREATININE 0.96 06/21/2017   Tobacco abuse- still smoking.    Continues to work with psychiatry.   Review of Systems    see HPI  Past Medical History:  Diagnosis Date  . Alcohol use   . Anxiety   . Bipolar disorder ([place])   . Depression   . Gestational diabetes    borderline II  . Osteoarthritis    knee  . Tobacco abuse      Social History   Socioeconomic History  . Marital status: Single    Spouse name: Not on file  . Number of children: Not on file  . Years of education: Not on file  . Highest education level: Not on file  Social Needs  . Financial resource strain: Not on file  . Food insecurity - worry: Not on file  . Food insecurity - inability: Not on file  . Transportation needs - medical: Not on file  . Transportation needs - non-medical: Not on file  Occupational History  . Occupation: Sport and exercise psychologist: [employer]  Tobacco Use  . Smoking status: Current Every Day Smoker    Types: Cigarettes    Last attempt to quit: 01/20/2013    Years since quitting: 4.6  . Smokeless tobacco: Never Used  . Tobacco comment: 1 pack cigarettes a week  Substance and Sexual Activity  . Alcohol use: Yes    Alcohol/week: 0.0 oz    Comment: occasionally  . Drug use: No  . Sexual activity: Not on file  Other Topics Concern  . Not on file  Social History Narrative   Lives with daughter   Works at [employer]   Enjoys sleeping   1 dog   Completed  high school, some college    Past Surgical History:  Procedure Laterality Date  . CHOLECYSTECTOMY  2003    Family History  Problem Relation Age of Onset  . Hypertension Mother   . Hyperlipidemia Mother   . Alcohol abuse Father     Allergies  Allergen Reactions  . Invokana [Canagliflozin] Other (See Comments)    Recurrent Yeast infections    Current Outpatient Medications on File Prior to Visit  Medication Sig Dispense Refill  . ALPRAZolam (XANAX) 0.5 MG tablet Take 0.5 mg by mouth 2 (two) times daily.  1  . ARIPiprazole (ABILIFY) 5 MG tablet Take 1 tablet by mouth daily.  1  . aspirin EC 81 MG tablet Take 81 mg by mouth daily.    Marland Kitchen glucose blood (ONE TOUCH ULTRA TEST) test strip TEST 3 TIMES DAILY BEFORE  MEALS AS DIRECTED 300 each 0  . Insulin Glargine (LANTUS SOLOSTAR) 100 UNIT/ML Solostar Pen Inject 80 Units into the skin daily at 10 pm. 24 pen 1  . Insulin Pen Needle (B-D ULTRAFINE III SHORT PEN) 31G X 8 MM MISC Use to inject insulin once daily.  100 each 1  . levonorgestrel (MIRENA) 20 MCG/24HR IUD 1 each by Intrauterine route once.    . Multiple Vitamin (MULTIVITAMIN) tablet Take 1 tablet by mouth daily.    . nicotine (NICODERM CQ - DOSED IN MG/24 HOURS) 21 mg/24hr patch APPLY 1 PATCH ONTO SKIN  DAILY 42 patch 1  . sertraline (ZOLOFT) 25 MG tablet Take 25 mg by mouth daily.  1  . valACYclovir (VALTREX) 500 MG tablet TAKE 1 TABLET BY MOUTH  EVERY DAY 90 tablet 1  . hydrOXYzine (VISTARIL) 25 MG capsule Take 25 mg by mouth at bedtime as needed for sleep.  1   No current facility-administered medications on file prior to visit.     BP (!) 130/54 (BP Location: Right Arm, Patient Position: Sitting, Cuff Size: Large)   Pulse 67   Temp 98.6 F (37 C) (Oral)   Resp 16   Ht 5\' 8"  (1.727 m)   Wt 242 lb 6.4 oz (110 kg)   SpO2 100%   BMI 36.86 kg/m    Objective:   Physical Exam  Constitutional: She is oriented to person, place, and time. She appears well-developed and  well-nourished.  HENT:  Head: Normocephalic and atraumatic.  Cardiovascular: Normal rate, regular rhythm and normal heart sounds.  No murmur heard. Pulmonary/Chest: Effort normal and breath sounds normal. No respiratory distress. She has no wheezes.  Musculoskeletal: She exhibits no edema.  Neurological: She is alert and oriented to person, place, and time.  Psychiatric: She has a normal mood and affect. Her behavior is normal. Judgment and thought content normal.          Assessment & Plan:  Hypertension-maintained on lisinopril.  Blood pressure stable.  Continue same.  Diabetes type 2 will obtain follow-up A1c.  She is starting to make some lifestyle and dietary changes which will hopefully improve her  sugars.  Continue Lantus and Januvia.  Hyperlipidemia-maintained on Lipitor.  LDL at goal.  Obtain updated lipid panel.  Tobacco abuse- discussed importance of smoking cessation.

## 2017-09-20 NOTE — Patient Instructions (Signed)
Please complete lab work prior to leaving.   

## 2017-09-20 NOTE — Addendum Note (Signed)
Addended by: Kelle Darting A on: 09/20/2017 08:20 AM   Modules accepted: Orders

## 2017-09-21 ENCOUNTER — Encounter: Payer: Self-pay | Admitting: Family

## 2017-09-23 ENCOUNTER — Other Ambulatory Visit (INDEPENDENT_AMBULATORY_CARE_PROVIDER_SITE_OTHER): Payer: Managed Care, Other (non HMO)

## 2017-09-23 DIAGNOSIS — E785 Hyperlipidemia, unspecified: Secondary | ICD-10-CM | POA: Diagnosis not present

## 2017-09-23 DIAGNOSIS — E1165 Type 2 diabetes mellitus with hyperglycemia: Secondary | ICD-10-CM | POA: Diagnosis not present

## 2017-09-23 DIAGNOSIS — I10 Essential (primary) hypertension: Secondary | ICD-10-CM | POA: Diagnosis not present

## 2017-09-23 LAB — COMPREHENSIVE METABOLIC PANEL
ALT: 17 U/L (ref 0–35)
AST: 15 U/L (ref 0–37)
Albumin: 3.7 g/dL (ref 3.5–5.2)
Alkaline Phosphatase: 52 U/L (ref 39–117)
BUN: 12 mg/dL (ref 6–23)
CHLORIDE: 103 meq/L (ref 96–112)
CO2: 26 meq/L (ref 19–32)
Calcium: 9.5 mg/dL (ref 8.4–10.5)
Creatinine, Ser: 0.83 mg/dL (ref 0.40–1.20)
GFR: 94.97 mL/min (ref >60.00)
GLUCOSE: 125 mg/dL — AB (ref 70–99)
POTASSIUM: 4.1 meq/L (ref 3.5–5.1)
Sodium: 134 mEq/L — ABNORMAL LOW (ref 135–145)
Total Bilirubin: 0.3 mg/dL (ref 0.2–1.2)
Total Protein: 6.9 g/dL (ref 6.0–8.3)

## 2017-09-23 LAB — LIPID PANEL
CHOL/HDL RATIO: 4
Cholesterol: 119 mg/dL (ref 0–200)
HDL: 32.2 mg/dL — ABNORMAL LOW (ref >39.00)
LDL CALC: 70 mg/dL (ref 0–99)
NONHDL: 86.94
Triglycerides: 83 mg/dL (ref 0.0–149.0)
VLDL: 16.6 mg/dL (ref 0.0–40.0)

## 2017-09-23 LAB — HEMOGLOBIN A1C: HEMOGLOBIN A1C: 8.9 % — AB (ref 4.6–6.5)

## 2017-09-24 ENCOUNTER — Telehealth: Payer: Self-pay | Admitting: Family

## 2017-09-24 DIAGNOSIS — E119 Type 2 diabetes mellitus without complications: Secondary | ICD-10-CM

## 2017-09-24 DIAGNOSIS — Z794 Long term (current) use of insulin: Principal | ICD-10-CM

## 2017-09-24 NOTE — Telephone Encounter (Signed)
Sugar is very uncontrolled. I would recommend referral to endocrinology. Referral is pended.

## 2017-09-25 NOTE — Telephone Encounter (Signed)
Mychart message sent and referral has been signed.

## 2017-11-10 NOTE — Progress Notes (Signed)
Patient ID: Tanya Wilcox, female   DOB: 1970-08-26, 47 y.o.   MRN: [id number]          Reason for Appointment: Consultation for Type 2 Diabetes  Referring PCP: Debbrah Alar   History of Present Illness:          Date of diagnosis of type 2 diabetes mellitus: 2007       Background history:   She does not remember the initial symptoms at diagnosis and level of blood sugar and no previous records available Her A1c was 7% in 2014 and as high as 9.9 in 2015 She has previously taken metformin, Amaryl, Invokana, Actos and Januvia with variable level of control Lowest A1c was 6.4 in 2017  Recent history:   INSULIN regimen is:     80 units of insulin, Lantus at bedtime   Non-insulin hypoglycemic drugs the patient is taking are:  Januvia 100 mg daily  Current management, blood sugar patterns and problems identified:  She was started in  7/18 on basal insulin using Basaglar presumably for worsening blood sugar control  She has been now taking Lantus instead and the dose has been increased progressively  Although her blood sugars are usually fairly good on an average in the morning they appear to be progressively higher later in the day  She is trying to exercise regularly but has not lost any weight  She is usually trying to cut back on carbohydrates especially at breakfast  However she thinks that her blood sugars go up when she had any form of carbohydrate  Also she is usually drinking up to 20 ounces of regular soft drinks at least every other day  She may go out for fast food sandwiches or other meals at least 3 days a week  Blood sugars have been averaging mostly over 200 after lunch and dinner        Side effects from medications have been: Nausea from 2000 mg of metformin, recurrent yeast infections with Invokana  Compliance with the medical regimen:  Hypoglycemia:    Glucose monitoring:  done 4 times a day         Glucometer: One Touch.       Blood Glucose  readings by time of day and averages from meter download:  PREMEAL Breakfast Lunch Dinner Bedtime  Overall   Glucose range:  78-175      Median:  120     160   POST-MEAL PC Breakfast PC Lunch PC Dinner  Glucose range:  102-314  152-267  Median:   208  204   Self-care: The diet that the patient has been following is: tries to limit carbs.      Meal times are:  Breakfast is at 6 AM lunch: 1 PM dinner: 4:30 PM Typical meal intake: Breakfast is eggs or chicken wings.  Lunch is usually a salad and evening meal is chicken wings.  May have snacks with grapes, candy and pickles               Dietician visit, most recent:2014               Exercise:  walks inside or outside 4-5 days a week 1 hour  Weight history:  Wt Readings from Last 3 Encounters:  11/11/17 248 lb (112.5 kg)  09/20/17 242 lb 6.4 oz (110 kg)  06/21/17 245 lb 3.2 oz (111.2 kg)    Glycemic control:   Lab Results  Component Value Date   HGBA1C  8.9 (H) 09/23/2017   HGBA1C 8.5 (H) 06/21/2017   HGBA1C 7.2 (H) 11/19/2016   Lab Results  Component Value Date   MICROALBUR <0.7 11/19/2016   LDLCALC 70 09/23/2017   CREATININE 0.83 09/23/2017   Lab Results  Component Value Date   MICRALBCREAT 1.1 11/19/2016    No results found for: FRUCTOSAMINE    Allergies as of 11/11/2017      Reactions   Invokana [canagliflozin] Other (See Comments)   Recurrent Yeast infections      Medication List        Accurate as of 11/11/17 11:59 PM. Always use your most recent med list.          ALPRAZolam 0.5 MG tablet Commonly known as:  XANAX Take 0.5 mg by mouth 2 (two) times daily.   ARIPiprazole 5 MG tablet Commonly known as:  ABILIFY Take 1 tablet by mouth daily.   aspirin EC 81 MG tablet Take 81 mg by mouth daily.   atorvastatin 40 MG tablet Commonly known as:  LIPITOR Take 1 tablet (40 mg total) by mouth daily.   furosemide 20 MG tablet Commonly known as:  LASIX TAKE 1 TABLET(20 MG) BY MOUTH DAILY AS NEEDED  FOR SWELLING   glucose blood test strip Commonly known as:  ONE TOUCH ULTRA TEST TEST 3 TIMES DAILY BEFORE  MEALS AS DIRECTED   hydrOXYzine 25 MG capsule Commonly known as:  VISTARIL Take 25 mg by mouth at bedtime as needed for sleep.   Insulin Glargine 100 UNIT/ML Solostar Pen Commonly known as:  LANTUS SOLOSTAR Inject 80 Units into the skin daily at 10 pm.   Insulin Pen Needle 31G X 8 MM Misc Commonly known as:  B-D ULTRAFINE III SHORT PEN Use to inject insulin once daily.   levonorgestrel 20 MCG/24HR IUD Commonly known as:  MIRENA 1 each by Intrauterine route once.   lisinopril 20 MG tablet Commonly known as:  PRINIVIL,ZESTRIL Take 1 tablet (20 mg total) by mouth daily.   metFORMIN 500 MG 24 hr tablet Commonly known as:  GLUCOPHAGE-XR Take 3 tablets (1,500 mg total) by mouth daily with supper.   multivitamin tablet Take 1 tablet by mouth daily.   nicotine 21 mg/24hr patch Commonly known as:  NICODERM CQ - dosed in mg/24 hours APPLY 1 PATCH ONTO SKIN  DAILY   sertraline 25 MG tablet Commonly known as:  ZOLOFT Take 25 mg by mouth daily.   sitaGLIPtin 100 MG tablet Commonly known as:  JANUVIA Take 1 tablet (100 mg total) by mouth daily.   valACYclovir 500 MG tablet Commonly known as:  VALTREX TAKE 1 TABLET BY MOUTH  EVERY DAY       Allergies:  Allergies  Allergen Reactions  . Invokana [Canagliflozin] Other (See Comments)    Recurrent Yeast infections    Past Medical History:  Diagnosis Date  . Alcohol use   . Anxiety   . Bipolar disorder ([place])   . Depression   . Gestational diabetes    borderline II  . Osteoarthritis    knee  . Tobacco abuse     Past Surgical History:  Procedure Laterality Date  . CHOLECYSTECTOMY  2003    Family History  Problem Relation Age of Onset  . Hypertension Mother   . Hyperlipidemia Mother   . Alcohol abuse Father     Social History:  reports that she has been smoking cigarettes.  She has never used  smokeless tobacco. She reports that she drinks alcohol. She  reports that she does not use drugs.   Review of Systems  Constitutional: Negative for weight loss.  HENT: Negative for trouble swallowing.   Eyes: Negative for blurred vision.  Respiratory: Positive for wheezing. Negative for shortness of breath.        Occasional wheezing on exertion  Cardiovascular: Positive for leg swelling.       Takes diuretics for swelling  Gastrointestinal: Positive for constipation. Negative for diarrhea.  Endocrine: Negative for fatigue.  Genitourinary: Negative for nocturia.  Musculoskeletal:       Has had some knee joint pain  Skin: Negative for rash.  Neurological:       Burning occurrence on her calluses but no sharp pains, tingling or numbness in the feet     Lipid history: Treatment by PCP with Lipitor 40 mg    Lab Results  Component Value Date   CHOL 119 09/23/2017   HDL 32.20 (L) 09/23/2017   LDLCALC 70 09/23/2017   TRIG 83.0 09/23/2017   CHOLHDL 4 09/23/2017           Hypertension: Treated with lisinopril 20 mg  BP Readings from Last 3 Encounters:  11/11/17 138/74  09/20/17 (!) 130/54  06/21/17 110/78    Most recent eye exam was in 09/2016, reportedly no retinopathy  Most recent foot exam: 10/2017    LABS:  No visits with results within 1 Week(s) from this visit.  Latest known visit with results is:  Lab on 09/23/2017  Component Date Value Ref Range Status  . Cholesterol 09/23/2017 119  0 - 200 mg/dL Final   ATP III Classification       Desirable:  < 200 mg/dL               Borderline High:  200 - 239 mg/dL          High:  > = 240 mg/dL  . Triglycerides 09/23/2017 83.0  0.0 - 149.0 mg/dL Final   Normal:  <150 mg/dLBorderline High:  150 - 199 mg/dL  . HDL 09/23/2017 32.20* >39.00 mg/dL Final  . VLDL 09/23/2017 16.6  0.0 - 40.0 mg/dL Final  . LDL Cholesterol 09/23/2017 70  0 - 99 mg/dL Final  . Total CHOL/HDL Ratio 09/23/2017 4   Final                  Men           Women1/2 Average Risk     3.4          3.3Average Risk          5.0          4.42X Average Risk          9.6          7.13X Average Risk          15.0          11.0                      . NonHDL 09/23/2017 86.94   Final   NOTE:  Non-HDL goal should be 30 mg/dL higher than patient's LDL goal (i.e. LDL goal of < 70 mg/dL, would have non-HDL goal of < 100 mg/dL)  . Hgb A1c MFr Bld 09/23/2017 8.9* 4.6 - 6.5 % Final   Glycemic Control Guidelines for People with Diabetes:Non Diabetic:  <6%Goal of Therapy: <7%Additional Action Suggested:  >8%   . Sodium 09/23/2017 134* 135 - 145 mEq/L Final  .  Potassium 09/23/2017 4.1  3.5 - 5.1 mEq/L Final  . Chloride 09/23/2017 103  96 - 112 mEq/L Final  . CO2 09/23/2017 26  19 - 32 mEq/L Final  . Glucose, Bld 09/23/2017 125* 70 - 99 mg/dL Final  . BUN 09/23/2017 12  6 - 23 mg/dL Final  . Creatinine, Ser 09/23/2017 0.83  0.40 - 1.20 mg/dL Final  . Total Bilirubin 09/23/2017 0.3  0.2 - 1.2 mg/dL Final  . Alkaline Phosphatase 09/23/2017 52  39 - 117 U/L Final  . AST 09/23/2017 15  0 - 37 U/L Final  . ALT 09/23/2017 17  0 - 35 U/L Final  . Total Protein 09/23/2017 6.9  6.0 - 8.3 g/dL Final  . Albumin 09/23/2017 3.7  3.5 - 5.2 g/dL Final  . Calcium 09/23/2017 9.5  8.4 - 10.5 mg/dL Final  . GFR 09/23/2017 94.97  >60.00 mL/min Final    Physical Examination:  BP 138/74 (BP Location: Left Arm, Patient Position: Sitting, Cuff Size: Normal)   Pulse 90   Ht 5\' 8"  (1.727 m)   Wt 248 lb (112.5 kg)   SpO2 98%   BMI 37.71 kg/m   GENERAL:         Patient has generalized obesity.    HEENT:         Eye exam shows normal external appearance.  Fundus exam shows no retinopathy.  Oral exam shows normal mucosa .  NECK:   There is no lymphadenopathy Thyroid is not enlarged and no nodules felt.  Carotids are normal to palpation and no bruit heard  LUNGS:         Chest is symmetrical. Lungs are clear to auscultation.Marland Kitchen   HEART:         Heart sounds:  S1 and S2 are normal.  No murmur or click heard., no S3 or S4.   ABDOMEN:   There is no distention present. Liver and spleen are not palpable. No other mass or tenderness present.    NEUROLOGICAL:   Ankle jerks are absent bilaterally.    Diabetic Foot Exam - Simple   Simple Foot Form Diabetic Foot exam was performed with the following findings:  Yes   Visual Inspection No deformities, no ulcerations, no other skin breakdown bilaterally:  Yes Sensation Testing Intact to touch and monofilament testing bilaterally:  Yes Pulse Check Posterior Tibialis and Dorsalis pulse intact bilaterally:  Yes Comments Distal plantar calluses present on both sides            Vibration sense is mildly reduced in distal first toes. MUSCULOSKELETAL:  There is no swelling or deformity of the peripheral joints.     EXTREMITIES:     There is no edema.  SKIN:       No rash or lesions of concern.        ASSESSMENT:  Diabetes type 2, uncontrolled with last A1c 8.9  See history of present illness for detailed discussion of current diabetes management, blood sugar patterns and problems identified  She is currently only on basal insulin and Januvia with significantly high postprandial readings With large doses of basal insulin she has occasionally low normal blood sugars fasting with symptoms of hypoglycemia also    She continues to have significant difficulty losing weight recently with large doses of basal insulin  Since she is intolerant to Invokana she is not a candidate for this Currently she is doing fairly well with controlling her caloric restriction and exercising regularly She does have blood sugar  spikes when she goes off her diet and has more carbohydrate or simple sugars especially with soft drinks  Complications of diabetes: None evident  HYPERTENSION and hyperlipidemia: Well controlled with relatively low HDL    PLAN:    Trial of a GLP-1 drug  She is a good candidate for medication like Ozempic but this is  not covered by her insurance Discussed with the patient the nature of GLP-1 drugs, the action on various organ systems, how they benefit blood glucose control, as well as the benefit of weight loss and  increase satiety . Explained possible side effects, particularly nausea and vomiting that usually resolve over time; discussed safety information in package insert. Demonstrated the medication injection device and injection technique to the patient.  Showed patient where to inject the medication. To start with 0.75 mg dosage weekly for the first 4 weeks Patient brochure on [employer] with enclosed co-pay card given  at the he same time she will stop Januvia  METFORMIN ER will be restarted to help insulin sensitivity and improve fasting blood sugars with potentially reducing insulin doses further.  She will start with 500 mg and increase the dose by 1 pill every week until reaching the maximally tolerated dose  She will reduce her Lantus to 70 units and change it to the morning to avoid overnight hypoglycemia and potentially better blood sugars during the day  Discussed that if her postprandial readings continue to be high she may need to add mealtime insulin also  Consultation with dietitian  She needs to significantly cut down on her intake of simple sugars especially soft drinks  Patient Instructions  Lantus 70 units in am  Check blood sugars on waking up 5/7   Also check blood sugars about 2 hours after a meal and do this after different meals by rotation  Recommended blood sugar levels on waking up is 80-120 and about 2 hours after meal is 130-160  Please bring your blood sugar monitor to each visit, thank you  Start TRULICITYwith the pen as shown once weekly on the same day of the week.   You may inject in the stomach, thigh or arm as indicated in the brochure given.  You will feel fullness of the stomach with starting the medication and should try to keep the portions at meals  small.   You may experience nausea in the first few days which usually gets better over time   If any questions or concerns are present call the office or the  [place] at [phone]. Also visit [email or web address] website for more useful information  Stop Januvia  Start taking Metformin 500 mg, 1 tablet with your main meal for 7 days. Occasionally this may initially cause loose stools or nausea. If  tolerating well after 7 days add a second Metformin tablet (500 mg) at the same time.  Continue adding another tablet after 5 days days if no persistent nausea or diarrhea until reaching the maximum tolerated dose or the 3 tablets once daily.           Consultation note has been sent to the referring physician  Elayne Snare 11/12/2017, 7:59 AM   Note: This office note was prepared with Dragon voice recognition system technology. Any transcriptional errors that result from this process are unintentional.

## 2017-11-11 ENCOUNTER — Ambulatory Visit: Payer: Managed Care, Other (non HMO) | Admitting: Endocrinology

## 2017-11-11 ENCOUNTER — Encounter: Payer: Self-pay | Admitting: Endocrinology

## 2017-11-11 VITALS — BP 138/74 | HR 90 | Ht 68.0 in | Wt 248.0 lb

## 2017-11-11 DIAGNOSIS — Z794 Long term (current) use of insulin: Secondary | ICD-10-CM

## 2017-11-11 DIAGNOSIS — E1165 Type 2 diabetes mellitus with hyperglycemia: Secondary | ICD-10-CM

## 2017-11-11 MED ORDER — METFORMIN HCL ER 500 MG PO TB24: 1500.0000 mg | ORAL_TABLET | Freq: Every day | ORAL | 3 refills | Status: DC

## 2017-11-11 NOTE — Patient Instructions (Addendum)
Lantus 70 units in am  Check blood sugars on waking up 5/7   Also check blood sugars about 2 hours after a meal and do this after different meals by rotation  Recommended blood sugar levels on waking up is 80-120 and about 2 hours after meal is 130-160  Please bring your blood sugar monitor to each visit, thank you  Start TRULICITYwith the pen as shown once weekly on the same day of the week.   You may inject in the stomach, thigh or arm as indicated in the brochure given.  You will feel fullness of the stomach with starting the medication and should try to keep the portions at meals small.   You may experience nausea in the first few days which usually gets better over time   If any questions or concerns are present call the office or the  [place] at [phone]. Also visit [email or web address]com website for more useful information  Stop Januvia  Start taking Metformin 500 mg, 1 tablet with your main meal for 7 days. Occasionally this may initially cause loose stools or nausea. If  tolerating well after 7 days add a second Metformin tablet (500 mg) at the same time.  Continue adding another tablet after 5 days days if no persistent nausea or diarrhea until reaching the maximum tolerated dose or the 3 tablets once daily.

## 2017-11-12 MED ORDER — DULAGLUTIDE 0.75 MG/0.5ML ~~LOC~~ SOAJ: SUBCUTANEOUS | 0 refills | Status: DC

## 2017-12-03 ENCOUNTER — Encounter: Payer: Self-pay | Admitting: Endocrinology

## 2017-12-03 ENCOUNTER — Other Ambulatory Visit: Payer: Self-pay

## 2017-12-03 MED ORDER — DULAGLUTIDE 0.75 MG/0.5ML ~~LOC~~ SOAJ: SUBCUTANEOUS | 0 refills | Status: DC

## 2017-12-13 ENCOUNTER — Ambulatory Visit: Payer: Managed Care, Other (non HMO) | Admitting: Dietician

## 2017-12-23 ENCOUNTER — Ambulatory Visit: Payer: Managed Care, Other (non HMO) | Admitting: Family

## 2017-12-23 ENCOUNTER — Encounter: Payer: Self-pay | Admitting: Family

## 2017-12-23 VITALS — BP 131/72 | HR 74 | Temp 98.1°F | Resp 18 | Ht 68.0 in | Wt 249.2 lb

## 2017-12-23 DIAGNOSIS — I10 Essential (primary) hypertension: Secondary | ICD-10-CM

## 2017-12-23 DIAGNOSIS — E785 Hyperlipidemia, unspecified: Secondary | ICD-10-CM | POA: Diagnosis not present

## 2017-12-23 DIAGNOSIS — R002 Palpitations: Secondary | ICD-10-CM

## 2017-12-23 DIAGNOSIS — Z794 Long term (current) use of insulin: Secondary | ICD-10-CM | POA: Diagnosis not present

## 2017-12-23 DIAGNOSIS — E119 Type 2 diabetes mellitus without complications: Secondary | ICD-10-CM | POA: Diagnosis not present

## 2017-12-23 DIAGNOSIS — R0609 Other forms of dyspnea: Secondary | ICD-10-CM

## 2017-12-23 LAB — TSH: TSH: 1.64 u[IU]/mL (ref 0.35–4.50)

## 2017-12-23 LAB — CBC WITH DIFFERENTIAL/PLATELET
BASOS ABS: 0 10*3/uL (ref 0.0–0.1)
BASOS PCT: 0.5 % (ref 0.0–3.0)
EOS ABS: 0.2 10*3/uL (ref 0.0–0.7)
Eosinophils Relative: 2.7 % (ref 0.0–5.0)
HEMATOCRIT: 37.7 % (ref 36.0–46.0)
HEMOGLOBIN: 12.8 g/dL (ref 12.0–15.0)
LYMPHS PCT: 29.7 % (ref 12.0–46.0)
Lymphs Abs: 2 10*3/uL (ref 0.7–4.0)
MCHC: 33.9 g/dL (ref 30.0–36.0)
MCV: 98 fl (ref 78.0–100.0)
Monocytes Absolute: 0.6 10*3/uL (ref 0.1–1.0)
Monocytes Relative: 9.2 % (ref 3.0–12.0)
Neutro Abs: 4 10*3/uL (ref 1.4–7.7)
Neutrophils Relative %: 57.9 % (ref 43.0–77.0)
Platelets: 402 10*3/uL — ABNORMAL HIGH (ref 150.0–400.0)
RBC: 3.85 Mil/uL — ABNORMAL LOW (ref 3.87–5.11)
RDW: 12.8 % (ref 11.5–15.5)
WBC: 6.8 10*3/uL (ref 4.0–10.5)

## 2017-12-23 LAB — COMPREHENSIVE METABOLIC PANEL
ALBUMIN: 4.1 g/dL (ref 3.5–5.2)
ALT: 21 U/L (ref 0–35)
AST: 17 U/L (ref 0–37)
Alkaline Phosphatase: 64 U/L (ref 39–117)
BUN: 16 mg/dL (ref 6–23)
CALCIUM: 9.2 mg/dL (ref 8.4–10.5)
CHLORIDE: 104 meq/L (ref 96–112)
CO2: 25 mEq/L (ref 19–32)
CREATININE: 0.84 mg/dL (ref 0.40–1.20)
GFR: 93.56 mL/min (ref >60.00)
Glucose, Bld: 90 mg/dL (ref 70–99)
Potassium: 3.7 mEq/L (ref 3.5–5.1)
Sodium: 139 mEq/L (ref 135–145)
Total Bilirubin: 0.3 mg/dL (ref 0.2–1.2)
Total Protein: 6.7 g/dL (ref 6.0–8.3)

## 2017-12-23 NOTE — Progress Notes (Signed)
Subjective:    Patient ID: Tanya Wilcox, female    DOB: 04/05/1971, 47 y.o.   MRN: [id number]  HPI  Patient is a 47 yr old female who presents today for follow up.  1) HTN- she is maintained on lisinopril 20mg .  Prn lasix.  BP Readings from Last 3 Encounters:  12/23/17 131/72  11/11/17 138/74  09/20/17 (!) 130/54   2)  DM2- reports that she is now on trulicity and her sugar has been really good. She is following with Dr. Dwyane Dee, endocrinology.   Lab Results  Component Value Date   HGBA1C 8.9 (H) 09/23/2017   HGBA1C 8.5 (H) 06/21/2017   HGBA1C 7.2 (H) 11/19/2016   Lab Results  Component Value Date   MICROALBUR <0.7 11/19/2016   LDLCALC 70 09/23/2017   CREATININE 0.83 09/23/2017   3) hyperlipidemia-  Lab Results  Component Value Date   CHOL 119 09/23/2017   HDL 32.20 (L) 09/23/2017   LDLCALC 70 09/23/2017   TRIG 83.0 09/23/2017   CHOLHDL 4 09/23/2017   She does report some DOE and palpitations with exertion.     Review of Systems See HPI  Past Medical History:  Diagnosis Date  . Alcohol use   . Anxiety   . Bipolar disorder ([place])   . Depression   . Gestational diabetes    borderline II  . Osteoarthritis    knee  . Tobacco abuse      Social History   Socioeconomic History  . Marital status: Single    Spouse name: Not on file  . Number of children: Not on file  . Years of education: Not on file  . Highest education level: Not on file  Occupational History  . Occupation: Sport and exercise psychologist: [employer]  Social Needs  . Financial resource strain: Not on file  . Food insecurity:    Worry: Not on file    Inability: Not on file  . Transportation needs:    Medical: Not on file    Non-medical: Not on file  Tobacco Use  . Smoking status: Current Every Day Smoker    Types: Cigarettes    Last attempt to quit: 01/20/2013    Years since quitting: 4.9  . Smokeless tobacco: Never Used  . Tobacco comment: 1 pack cigarettes a week  Substance and Sexual  Activity  . Alcohol use: Yes    Alcohol/week: 0.0 oz    Comment: occasionally  . Drug use: No  . Sexual activity: Not on file  Lifestyle  . Physical activity:    Days per week: Not on file    Minutes per session: Not on file  . Stress: Not on file  Relationships  . Social connections:    Talks on phone: Not on file    Gets together: Not on file    Attends religious service: Not on file    Active member of club or organization: Not on file    Attends meetings of clubs or organizations: Not on file    Relationship status: Not on file  . Intimate partner violence:    Fear of current or ex partner: Not on file    Emotionally abused: Not on file    Physically abused: Not on file    Forced sexual activity: Not on file  Other Topics Concern  . Not on file  Social History Narrative   Lives with daughter   Works at [employer] and [employer]   Enjoys sleeping  1 dog   Completed high school, some college    Past Surgical History:  Procedure Laterality Date  . CHOLECYSTECTOMY  2003    Family History  Problem Relation Age of Onset  . Hypertension Mother   . Hyperlipidemia Mother   . Alcohol abuse Father     Allergies  Allergen Reactions  . Invokana [Canagliflozin] Other (See Comments)    Recurrent Yeast infections    Current Outpatient Medications on File Prior to Visit  Medication Sig Dispense Refill  . ALPRAZolam (XANAX) 0.5 MG tablet Take 0.5 mg by mouth 2 (two) times daily.  1  . ARIPiprazole (ABILIFY) 5 MG tablet Take 1 tablet by mouth daily.  1  . aspirin EC 81 MG tablet Take 81 mg by mouth daily.    Marland Kitchen atorvastatin (LIPITOR) 40 MG tablet Take 1 tablet (40 mg total) by mouth daily. 90 tablet 1  . Dulaglutide (TRULICITY) 2.67 TI/4.5YK SOPN Inject in the abdominal skin as directed once a week 4 pen 0  . furosemide (LASIX) 20 MG tablet TAKE 1 TABLET(20 MG) BY MOUTH DAILY AS NEEDED FOR SWELLING 90 tablet 1  . glucose blood (ONE TOUCH ULTRA TEST) test strip TEST 3  TIMES DAILY BEFORE  MEALS AS DIRECTED 300 each 5  . hydrOXYzine (VISTARIL) 25 MG capsule Take 25 mg by mouth at bedtime as needed for sleep.  1  . Insulin Glargine (LANTUS SOLOSTAR) 100 UNIT/ML Solostar Pen Inject 80 Units into the skin daily at 10 pm. (Patient taking differently: Inject 70 Units into the skin daily at 10 pm. ) 24 pen 1  . Insulin Pen Needle (B-D ULTRAFINE III SHORT PEN) 31G X 8 MM MISC Use to inject insulin once daily. 100 each 1  . levonorgestrel (MIRENA) 20 MCG/24HR IUD 1 each by Intrauterine route once.    Marland Kitchen lisinopril (PRINIVIL,ZESTRIL) 20 MG tablet Take 1 tablet (20 mg total) by mouth daily. 90 tablet 1  . metFORMIN (GLUCOPHAGE-XR) 500 MG 24 hr tablet Take 3 tablets (1,500 mg total) by mouth daily with supper. 90 tablet 3  . Multiple Vitamin (MULTIVITAMIN) tablet Take 1 tablet by mouth daily.    . sertraline (ZOLOFT) 25 MG tablet Take 25 mg by mouth daily.  1  . valACYclovir (VALTREX) 500 MG tablet TAKE 1 TABLET BY MOUTH  EVERY DAY 90 tablet 1  . nicotine (NICODERM CQ - DOSED IN MG/24 HOURS) 21 mg/24hr patch APPLY 1 PATCH ONTO SKIN  DAILY (Patient not taking: Reported on 12/23/2017) 42 patch 1   No current facility-administered medications on file prior to visit.     BP 131/72 (BP Location: Right Arm, Cuff Size: Large)   Pulse 74   Temp 98.1 F (36.7 C) (Oral)   Resp 18   Ht 5\' 8"  (1.727 m)   Wt 249 lb 3.2 oz (113 kg)   SpO2 100%   BMI 37.89 kg/m       Objective:   Physical Exam  Constitutional: She is oriented to person, place, and time. She appears well-developed and well-nourished.  HENT:  Head: Normocephalic and atraumatic.  Cardiovascular: Normal rate, regular rhythm and normal heart sounds.  No murmur heard. Pulmonary/Chest: Effort normal and breath sounds normal. No respiratory distress. She has no wheezes.  Musculoskeletal: She exhibits no edema.  Neurological: She is alert and oriented to person, place, and time.  Skin: Skin is warm and dry.    Psychiatric: She has a normal mood and affect. Her behavior is normal. Judgment and  thought content normal.          Assessment & Plan:  HTN- BP stable on current medications.  DM2- improving on trulicity. Management per endo.  Palpitations/DOE- EKG tracing is personally reviewed.  EKG notes NSR.  No acute changes.  Obtain CMET, CBC, TSH. Will refer to Cardiology for further evaluation.  She is advised to go to the ED if she develops CP/SOB.   Hyperlipidemia- LDL at goal.  Continue statin.

## 2017-12-23 NOTE — Patient Instructions (Signed)
Please complete lab work prior to leaving. You should be contacted about your referral to cardiology.   

## 2017-12-23 NOTE — Progress Notes (Signed)
e

## 2017-12-30 ENCOUNTER — Encounter: Payer: Self-pay | Admitting: Endocrinology

## 2017-12-30 ENCOUNTER — Other Ambulatory Visit: Payer: Self-pay

## 2017-12-30 ENCOUNTER — Ambulatory Visit: Payer: Managed Care, Other (non HMO) | Admitting: Endocrinology

## 2017-12-30 VITALS — BP 132/80 | HR 88 | Ht 68.0 in | Wt 250.2 lb

## 2017-12-30 DIAGNOSIS — Z794 Long term (current) use of insulin: Secondary | ICD-10-CM | POA: Diagnosis not present

## 2017-12-30 DIAGNOSIS — E1165 Type 2 diabetes mellitus with hyperglycemia: Secondary | ICD-10-CM

## 2017-12-30 MED ORDER — INSULIN GLARGINE 300 UNIT/ML ~~LOC~~ SOPN: 70.0000 [IU] | PEN_INJECTOR | Freq: Every day | SUBCUTANEOUS | 3 refills | Status: DC

## 2017-12-30 NOTE — Progress Notes (Signed)
Patient ID: Tanya Wilcox, female   DOB: Feb 15, 1971, 47 y.o.   MRN: [id number]          Reason for Appointment: Follow-up for Type 2 Diabetes  Referring PCP: Debbrah Alar   History of Present Illness:          Date of diagnosis of type 2 diabetes mellitus: 2007       Background history:   She does not remember the initial symptoms at diagnosis and level of blood sugar and no previous records available Her A1c was 7% in 2014 and as high as 9.9 in 2015 She has previously taken metformin, Amaryl, Invokana, Actos and Januvia with variable level of control Lowest A1c was 6.4 in 2017 She was started in  7/18 on basal insulin using Basaglar presumably for worsening blood sugar control  Recent history:   INSULIN regimen is: 70 units of insulin, Lantus in am  Non-insulin hypoglycemic drugs the patient is taking are: Trulicity 0.86 mg weekly, metformin ER 1500 mg daily  Current management, blood sugar patterns and problems identified:  She was started on Trulicity on her initial consultation in 10/2017 about 6 weeks ago  Although she had initial nausea and decreased appetite she has not had any side effects recently  Her blood sugars are significantly better overall with much less postprandial hyperglycemia compared to her last visit  However still has sporadic increased readings over 200 based on what she is eating  More recently has not check enough readings after meals and mostly during readings in the mornings  Also although she was recommended seeing the dietitian she has not made the appointment and her main difficulty is trying to follow her meal plan consistently  Also not always cutting back on higher fat foods or regular soft drinks  She has been now taking Lantus in the morning instead of evening  She had an unusual episode of low blood sugar within an hour after taking this in the morning with blood sugar going down to 45 otherwise no hypoglycemia  Recently she is  trying to take 3 tablets of metformin at dinnertime but this tends to cause some diarrhea and somewhat frequent stools in the daytime        Side effects from medications have been: Nausea from 2000 mg of metformin, recurrent yeast infections with Invokana  Compliance with the medical regimen:  Hypoglycemia:    Glucose monitoring:  done 3-4 times a day         Glucometer: One Touch.       Blood Glucose readings by time of day and averages from meter download:  Mean values apply above for all meters except median for One Touch  PRE-MEAL Fasting Lunch Dinner Bedtime Overall  Glucose range:  83-160  107-143  86-183    Mean/median:  115  132   130   POST-MEAL PC Breakfast PC Lunch PC Dinner  Glucose range:    114-249  Mean/median:  140   152    Previous readings:  PREMEAL Breakfast Lunch Dinner Bedtime  Overall   Glucose range:  78-175      Median:  120     160   POST-MEAL PC Breakfast PC Lunch PC Dinner  Glucose range:  102-314  152-267  Median:   208  204   Self-care: The diet that the patient has been following is: tries to limit carbs.      Meal times are:  Breakfast is at 6 AM lunch: 1  PM dinner: 4:30 PM Typical meal intake: Breakfast is eggs or chicken wings.  Lunch is usually a salad and evening meal is chicken wings.  May have snacks with grapes, candy and pickles               Dietician visit, most recent: 2014               Exercise:  Was walking inside or outside 4-5 days a week up to 1 hour, none recently  Weight history:  Wt Readings from Last 3 Encounters:  12/30/17 250 lb 3.2 oz (113.5 kg)  12/23/17 249 lb 3.2 oz (113 kg)  11/11/17 248 lb (112.5 kg)    Glycemic control:   Lab Results  Component Value Date   HGBA1C 8.9 (H) 09/23/2017   HGBA1C 8.5 (H) 06/21/2017   HGBA1C 7.2 (H) 11/19/2016   Lab Results  Component Value Date   MICROALBUR <0.7 11/19/2016   LDLCALC 70 09/23/2017   CREATININE 0.84 12/23/2017   Lab Results  Component Value Date     MICRALBCREAT 1.1 11/19/2016    No results found for: FRUCTOSAMINE    Allergies as of 12/30/2017      Reactions   Invokana [canagliflozin] Other (See Comments)   Recurrent Yeast infections      Medication List        Accurate as of 12/30/17  8:34 PM. Always use your most recent med list.          ALPRAZolam 0.5 MG tablet Commonly known as:  XANAX Take 0.5 mg by mouth 2 (two) times daily.   ARIPiprazole 5 MG tablet Commonly known as:  ABILIFY Take 1 tablet by mouth daily.   aspirin EC 81 MG tablet Take 81 mg by mouth daily.   atorvastatin 40 MG tablet Commonly known as:  LIPITOR Take 1 tablet (40 mg total) by mouth daily.   Dulaglutide 0.75 MG/0.5ML Sopn Commonly known as:  TRULICITY Inject in the abdominal skin as directed once a week   furosemide 20 MG tablet Commonly known as:  LASIX TAKE 1 TABLET(20 MG) BY MOUTH DAILY AS NEEDED FOR SWELLING   glucose blood test strip Commonly known as:  ONE TOUCH ULTRA TEST TEST 3 TIMES DAILY BEFORE  MEALS AS DIRECTED   hydrOXYzine 25 MG capsule Commonly known as:  VISTARIL Take 25 mg by mouth at bedtime as needed for sleep.   Insulin Glargine 100 UNIT/ML Solostar Pen Commonly known as:  LANTUS SOLOSTAR Inject 80 Units into the skin daily at 10 pm.   Insulin Glargine 300 UNIT/ML Sopn Commonly known as:  TOUJEO MAX SOLOSTAR Inject 70 Units into the skin daily.   Insulin Pen Needle 31G X 8 MM Misc Commonly known as:  B-D ULTRAFINE III SHORT PEN Use to inject insulin once daily.   levonorgestrel 20 MCG/24HR IUD Commonly known as:  MIRENA 1 each by Intrauterine route once.   lisinopril 20 MG tablet Commonly known as:  PRINIVIL,ZESTRIL Take 1 tablet (20 mg total) by mouth daily.   metFORMIN 500 MG 24 hr tablet Commonly known as:  GLUCOPHAGE-XR Take 3 tablets (1,500 mg total) by mouth daily with supper.   multivitamin tablet Take 1 tablet by mouth daily.   nicotine 21 mg/24hr patch Commonly known as:   NICODERM CQ - dosed in mg/24 hours APPLY 1 PATCH ONTO SKIN  DAILY   sertraline 25 MG tablet Commonly known as:  ZOLOFT Take 25 mg by mouth daily.   valACYclovir 500 MG tablet  Commonly known as:  VALTREX TAKE 1 TABLET BY MOUTH  EVERY DAY       Allergies:  Allergies  Allergen Reactions  . Invokana [Canagliflozin] Other (See Comments)    Recurrent Yeast infections    Past Medical History:  Diagnosis Date  . Alcohol use   . Anxiety   . Bipolar disorder ([place])   . Depression   . Gestational diabetes    borderline II  . Osteoarthritis    knee  . Tobacco abuse     Past Surgical History:  Procedure Laterality Date  . CHOLECYSTECTOMY  2003    Family History  Problem Relation Age of Onset  . Hypertension Mother   . Hyperlipidemia Mother   . Alcohol abuse Father     Social History:  reports that she has been smoking cigarettes.  She has never used smokeless tobacco. She reports that she drinks alcohol. She reports that she does not use drugs.   Review of Systems  Constitutional: Negative for weight loss.  HENT: Negative for trouble swallowing.   Eyes: Negative for blurred vision.  Respiratory: Positive for wheezing. Negative for shortness of breath.        Occasional wheezing on exertion  Cardiovascular: Positive for leg swelling.       Takes diuretics for swelling  Gastrointestinal: Positive for constipation. Negative for diarrhea.  Endocrine: Negative for fatigue.  Genitourinary: Negative for nocturia.  Musculoskeletal:       Has had some knee joint pain  Skin: Negative for rash.  Neurological:       Burning occurrence on her calluses but no sharp pains, tingling or numbness in the feet     Lipid history: Treatment by PCP with Lipitor 40 mg    Lab Results  Component Value Date   CHOL 119 09/23/2017   HDL 32.20 (L) 09/23/2017   LDLCALC 70 09/23/2017   TRIG 83.0 09/23/2017   CHOLHDL 4 09/23/2017           Hypertension: Treated with lisinopril 20  mg  BP Readings from Last 3 Encounters:  12/30/17 132/80  12/23/17 131/72  11/11/17 138/74    Most recent eye exam was in 09/2016, reportedly no retinopathy  Most recent foot exam: 10/2017    LABS:  No visits with results within 1 Week(s) from this visit.  Latest known visit with results is:  Office Visit on 12/23/2017  Component Date Value Ref Range Status  . HM Diabetic Eye Exam 09/20/2017 No Retinopathy  No Retinopathy Final  . Sodium 12/23/2017 139  135 - 145 mEq/L Final  . Potassium 12/23/2017 3.7  3.5 - 5.1 mEq/L Final  . Chloride 12/23/2017 104  96 - 112 mEq/L Final  . CO2 12/23/2017 25  19 - 32 mEq/L Final  . Glucose, Bld 12/23/2017 90  70 - 99 mg/dL Final  . BUN 12/23/2017 16  6 - 23 mg/dL Final  . Creatinine, Ser 12/23/2017 0.84  0.40 - 1.20 mg/dL Final  . Total Bilirubin 12/23/2017 0.3  0.2 - 1.2 mg/dL Final  . Alkaline Phosphatase 12/23/2017 64  39 - 117 U/L Final  . AST 12/23/2017 17  0 - 37 U/L Final  . ALT 12/23/2017 21  0 - 35 U/L Final  . Total Protein 12/23/2017 6.7  6.0 - 8.3 g/dL Final  . Albumin 12/23/2017 4.1  3.5 - 5.2 g/dL Final  . Calcium 12/23/2017 9.2  8.4 - 10.5 mg/dL Final  . GFR 12/23/2017 93.56  >60.00 mL/min Final  .  WBC 12/23/2017 6.8  4.0 - 10.5 K/uL Final  . RBC 12/23/2017 3.85* 3.87 - 5.11 Mil/uL Final  . Hemoglobin 12/23/2017 12.8  12.0 - 15.0 g/dL Final  . HCT 12/23/2017 37.7  36.0 - 46.0 % Final  . MCV 12/23/2017 98.0  78.0 - 100.0 fl Final  . MCHC 12/23/2017 33.9  30.0 - 36.0 g/dL Final  . RDW 12/23/2017 12.8  11.5 - 15.5 % Final  . Platelets 12/23/2017 402.0* 150.0 - 400.0 K/uL Final  . Neutrophils Relative % 12/23/2017 57.9  43.0 - 77.0 % Final  . Lymphocytes Relative 12/23/2017 29.7  12.0 - 46.0 % Final  . Monocytes Relative 12/23/2017 9.2  3.0 - 12.0 % Final  . Eosinophils Relative 12/23/2017 2.7  0.0 - 5.0 % Final  . Basophils Relative 12/23/2017 0.5  0.0 - 3.0 % Final  . Neutro Abs 12/23/2017 4.0  1.4 - 7.7 K/uL Final  .  Lymphs Abs 12/23/2017 2.0  0.7 - 4.0 K/uL Final  . Monocytes Absolute 12/23/2017 0.6  0.1 - 1.0 K/uL Final  . Eosinophils Absolute 12/23/2017 0.2  0.0 - 0.7 K/uL Final  . Basophils Absolute 12/23/2017 0.0  0.0 - 0.1 K/uL Final  . TSH 12/23/2017 1.64  0.35 - 4.50 uIU/mL Final    Physical Examination:  BP 132/80 (BP Location: Left Arm, Patient Position: Sitting, Cuff Size: Normal)   Pulse 88   Ht 5\' 8"  (1.727 m)   Wt 250 lb 3.2 oz (113.5 kg)   SpO2 98%   BMI 38.04 kg/m          ASSESSMENT:  Diabetes type 2, recent BMI 38  See history of present illness for detailed discussion of current diabetes management, blood sugar patterns and problems identified   Her postprandial readings are significantly better with adding Trulicity 4.26 mg for the last 6 weeks or so However she has not lost any weight With taking 70 units of Lantus her fasting readings are still good with taking this in the morning, previously on 80 units She still has sporadic high postprandial readings based on her compliance with diet Also recently not motivated to exercise as before She has not seen the dietitian as recommended   PLAN:    Although she may benefit further from increasing her Trulicity to 1.5 mg she already has a 60-day supply at home and will wait until next visit  Needs A1c on the follow-up visit  She does need to start walking regularly again  Since she tends to have diarrhea with 1500 mg of metformin ER she can try 1000 mg for now  To make appointment with dietitian for meal planning  Recommended that she see the dietitian so she can implement a meal plan and get help with compliance with following this  Trial of TOUJEO instead of Lantus for more consistent control throughout the day and also be able to benefit from a larger capacity pen with the Toujeo max formulation  To start which he can start the same dosage of 70 units in the morning  Given information on Toujeo and co-pay  card  She may need to adjust the dose further of this insulin based on her fasting blood sugar pattern  Also needs to check more POSTPRANDIAL readings and less in the morning to get more consistent idea of her overall patterns  Patient Instructions  Metformin 2 daily  Check blood sugars on waking up  3-4/7  Also check blood sugars about 2 hours after a meal  and do this after different meals by rotation  Recommended blood sugar levels on waking up is 90-130 and about 2 hours after meal is 130-160  Please bring your blood sugar monitor to each visit, thank you  Walk daily  Diet consult     Counseling time on subjects discussed in assessment and plan sections is over 50% of today's 25 minute visit   Elayne Snare 12/30/2017, 8:34 PM   Note: This office note was prepared with Dragon voice recognition system technology. Any transcriptional errors that result from this process are unintentional.

## 2017-12-30 NOTE — Patient Instructions (Addendum)
Metformin 2 daily  Check blood sugars on waking up  3-4/7  Also check blood sugars about 2 hours after a meal and do this after different meals by rotation  Recommended blood sugar levels on waking up is 90-130 and about 2 hours after meal is 130-160  Please bring your blood sugar monitor to each visit, thank you  Walk daily  Diet consult

## 2018-01-05 ENCOUNTER — Encounter: Payer: Self-pay | Admitting: Endocrinology

## 2018-02-28 ENCOUNTER — Other Ambulatory Visit (INDEPENDENT_AMBULATORY_CARE_PROVIDER_SITE_OTHER): Payer: Managed Care, Other (non HMO)

## 2018-02-28 DIAGNOSIS — Z794 Long term (current) use of insulin: Secondary | ICD-10-CM | POA: Diagnosis not present

## 2018-02-28 DIAGNOSIS — E1165 Type 2 diabetes mellitus with hyperglycemia: Secondary | ICD-10-CM

## 2018-02-28 LAB — COMPREHENSIVE METABOLIC PANEL
ALBUMIN: 4.2 g/dL (ref 3.5–5.2)
ALK PHOS: 56 U/L (ref 39–117)
ALT: 23 U/L (ref 0–35)
AST: 17 U/L (ref 0–37)
BILIRUBIN TOTAL: 0.3 mg/dL (ref 0.2–1.2)
BUN: 12 mg/dL (ref 6–23)
CO2: 25 mEq/L (ref 19–32)
CREATININE: 0.93 mg/dL (ref 0.40–1.20)
Calcium: 9.8 mg/dL (ref 8.4–10.5)
Chloride: 107 mEq/L (ref 96–112)
GFR: 83.13 mL/min (ref >60.00)
Glucose, Bld: 177 mg/dL — ABNORMAL HIGH (ref 70–99)
POTASSIUM: 3.9 meq/L (ref 3.5–5.1)
SODIUM: 139 meq/L (ref 135–145)
TOTAL PROTEIN: 7.3 g/dL (ref 6.0–8.3)

## 2018-02-28 LAB — HEMOGLOBIN A1C: HEMOGLOBIN A1C: 7.2 % — AB (ref 4.6–6.5)

## 2018-03-03 ENCOUNTER — Ambulatory Visit: Payer: Managed Care, Other (non HMO) | Admitting: Endocrinology

## 2018-03-03 ENCOUNTER — Encounter: Payer: Self-pay | Admitting: Endocrinology

## 2018-03-03 VITALS — BP 144/88 | HR 68 | Ht 68.0 in | Wt 255.8 lb

## 2018-03-03 DIAGNOSIS — E1165 Type 2 diabetes mellitus with hyperglycemia: Secondary | ICD-10-CM | POA: Diagnosis not present

## 2018-03-03 DIAGNOSIS — Z794 Long term (current) use of insulin: Secondary | ICD-10-CM | POA: Diagnosis not present

## 2018-03-03 MED ORDER — DULAGLUTIDE 1.5 MG/0.5ML ~~LOC~~ SOAJ: SUBCUTANEOUS | 0 refills | Status: DC

## 2018-03-03 NOTE — Patient Instructions (Addendum)
Walking daily  Toujeo 74

## 2018-03-03 NOTE — Progress Notes (Signed)
Patient ID: Tanya Wilcox, female   DOB: 09/04/1970, 47 y.o.   MRN: [id number]          Reason for Appointment: Follow-up for Type 2 Diabetes  Referring PCP: Debbrah Alar   History of Present Illness:          Date of diagnosis of type 2 diabetes mellitus: 2007       Background history:   She does not remember the initial symptoms at diagnosis and level of blood sugar and no previous records available Her A1c was 7% in 2014 and as high as 9.9 in 2015 She has previously taken metformin, Amaryl, Invokana, Actos and Januvia with variable level of control Lowest A1c was 6.4 in 2017 She was started in  7/18 on basal insulin using Basaglar presumably for worsening blood sugar control  Recent history:   INSULIN regimen is: 70 units of TOUJEO insulin, in am  Non-insulin hypoglycemic drugs the patient is taking are: Trulicity 7.82 mg weekly, metformin ER 1500 mg daily  Current management, blood sugar patterns and problems identified:  He is now taking Toujeo instead of Lantus  Although she had initial improvement in her blood sugars with starting Trulicity in 9/56 her blood sugars are looking worse than on her last visit  She thinks that most of this is related to her diet being poor and not focusing on eating well  However also appears to have relatively higher fasting readings with switching from Lantus to Toujeo and she did not report this  However her weight has gone up 5 pounds  She has not been exercising enough and only occasionally walking at work  She was recommended seeing the dietitian but she canceled the appointment  She has also been asked to take her metformin 3 tablets daily in split doses  Last week she ran out of her Toujeo but she has not checked her blood sugars for the last 4 days        Side effects from medications have been: Nausea from 2000 mg of metformin, recurrent yeast infections with Invokana  Compliance with the medical regimen:    Hypoglycemia:    Glucose monitoring:  done 3-4 times a day         Glucometer: One Touch.       Blood Glucose readings by time of day and averages from meter download:    PRE-MEAL Fasting Lunch Dinner Bedtime Overall  Glucose range:  132-201   121, 150  212, 263  120-286  Mean/median:  158  201    176+/-43   POST-MEAL PC Breakfast PC Lunch PC Dinner  Glucose range:     Mean/median:  203       Previous readings:  PRE-MEAL Fasting Lunch Dinner Bedtime Overall  Glucose range:  83-160  107-143  86-183    Mean/median:  115  132   130   POST-MEAL PC Breakfast PC Lunch PC Dinner  Glucose range:    114-249  Mean/median:  140   152     Self-care: The diet that the patient has been following is: tries to limit carbs.      Meal times are:  Breakfast is at 6 AM lunch: 1 PM dinner: 4:30 PM Typical meal intake: Breakfast is eggs or chicken wings.  Lunch is usually a salad and evening meal is chicken wings.  May have snacks with grapes, candy and pickles               Dietician  visit, most recent: 2014               Exercise:  15 minutes walking at times   Weight history:  Wt Readings from Last 3 Encounters:  03/03/18 255 lb 12.8 oz (116 kg)  12/30/17 250 lb 3.2 oz (113.5 kg)  12/23/17 249 lb 3.2 oz (113 kg)    Glycemic control:   Lab Results  Component Value Date   HGBA1C 7.2 (H) 02/28/2018   HGBA1C 8.9 (H) 09/23/2017   HGBA1C 8.5 (H) 06/21/2017   Lab Results  Component Value Date   MICROALBUR <0.7 11/19/2016   LDLCALC 70 09/23/2017   CREATININE 0.93 02/28/2018   Lab Results  Component Value Date   MICRALBCREAT 1.1 11/19/2016    No results found for: FRUCTOSAMINE    Allergies as of 03/03/2018      Reactions   Invokana [canagliflozin] Other (See Comments)   Recurrent Yeast infections      Medication List        Accurate as of 03/03/18  3:52 PM. Always use your most recent med list.          ALPRAZolam 0.5 MG tablet Commonly known as:   XANAX Take 0.5 mg by mouth 2 (two) times daily.   ARIPiprazole 5 MG tablet Commonly known as:  ABILIFY Take 1 tablet by mouth daily.   aspirin EC 81 MG tablet Take 81 mg by mouth daily.   atorvastatin 40 MG tablet Commonly known as:  LIPITOR Take 1 tablet (40 mg total) by mouth daily.   Dulaglutide 0.75 MG/0.5ML Sopn Inject in the abdominal skin as directed once a week   furosemide 20 MG tablet Commonly known as:  LASIX TAKE 1 TABLET(20 MG) BY MOUTH DAILY AS NEEDED FOR SWELLING   glucose blood test strip TEST 3 TIMES DAILY BEFORE  MEALS AS DIRECTED   hydrOXYzine 25 MG capsule Commonly known as:  VISTARIL Take 25 mg by mouth at bedtime as needed for sleep.   Insulin Glargine 100 UNIT/ML Solostar Pen Commonly known as:  LANTUS Inject 80 Units into the skin daily at 10 pm.   Insulin Glargine 300 UNIT/ML Sopn Inject 70 Units into the skin daily.   Insulin Pen Needle 31G X 8 MM Misc Use to inject insulin once daily.   levonorgestrel 20 MCG/24HR IUD Commonly known as:  MIRENA 1 each by Intrauterine route once.   lisinopril 20 MG tablet Commonly known as:  PRINIVIL,ZESTRIL Take 1 tablet (20 mg total) by mouth daily.   metFORMIN 500 MG 24 hr tablet Commonly known as:  GLUCOPHAGE-XR Take 3 tablets (1,500 mg total) by mouth daily with supper.   multivitamin tablet Take 1 tablet by mouth daily.   nicotine 21 mg/24hr patch Commonly known as:  NICODERM CQ - dosed in mg/24 hours APPLY 1 PATCH ONTO SKIN  DAILY   sertraline 25 MG tablet Commonly known as:  ZOLOFT Take 25 mg by mouth daily.   valACYclovir 500 MG tablet Commonly known as:  VALTREX TAKE 1 TABLET BY MOUTH  EVERY DAY       Allergies:  Allergies  Allergen Reactions  . Invokana [Canagliflozin] Other (See Comments)    Recurrent Yeast infections    Past Medical History:  Diagnosis Date  . Alcohol use   . Anxiety   . Bipolar disorder ([place])   . Depression   . Gestational diabetes    borderline  II  . Osteoarthritis    knee  . Tobacco abuse  Past Surgical History:  Procedure Laterality Date  . CHOLECYSTECTOMY  2003    Family History  Problem Relation Age of Onset  . Hypertension Mother   . Hyperlipidemia Mother   . Alcohol abuse Father     Social History:  reports that she has been smoking cigarettes. She has never used smokeless tobacco. She reports that she drinks alcohol. She reports that she does not use drugs.   Review of Systems   Lipid history: On treatment by PCP with Lipitor 40 mg    Lab Results  Component Value Date   CHOL 119 09/23/2017   HDL 32.20 (L) 09/23/2017   LDLCALC 70 09/23/2017   TRIG 83.0 09/23/2017   CHOLHDL 4 09/23/2017           Hypertension: Treated with lisinopril 20 mg  BP Readings from Last 3 Encounters:  03/03/18 (!) 144/88  12/30/17 132/80  12/23/17 131/72    Most recent eye exam was in 09/2016, reportedly no retinopathy  Most recent foot exam: 10/2017    LABS:  Lab on 02/28/2018  Component Date Value Ref Range Status  . Sodium 02/28/2018 139  135 - 145 mEq/L Final  . Potassium 02/28/2018 3.9  3.5 - 5.1 mEq/L Final  . Chloride 02/28/2018 107  96 - 112 mEq/L Final  . CO2 02/28/2018 25  19 - 32 mEq/L Final  . Glucose, Bld 02/28/2018 177* 70 - 99 mg/dL Final  . BUN 02/28/2018 12  6 - 23 mg/dL Final  . Creatinine, Ser 02/28/2018 0.93  0.40 - 1.20 mg/dL Final  . Total Bilirubin 02/28/2018 0.3  0.2 - 1.2 mg/dL Final  . Alkaline Phosphatase 02/28/2018 56  39 - 117 U/L Final  . AST 02/28/2018 17  0 - 37 U/L Final  . ALT 02/28/2018 23  0 - 35 U/L Final  . Total Protein 02/28/2018 7.3  6.0 - 8.3 g/dL Final  . Albumin 02/28/2018 4.2  3.5 - 5.2 g/dL Final  . Calcium 02/28/2018 9.8  8.4 - 10.5 mg/dL Final  . GFR 02/28/2018 83.13  >60.00 mL/min Final  . Hgb A1c MFr Bld 02/28/2018 7.2* 4.6 - 6.5 % Final   Glycemic Control Guidelines for People with Diabetes:Non Diabetic:  <6%Goal of Therapy: <7%Additional Action Suggested:   >8%     Physical Examination:  BP (!) 144/88 (BP Location: Left Arm, Patient Position: Sitting, Cuff Size: Large)   Pulse 68   Ht 5\' 8"  (1.727 m)   Wt 255 lb 12.8 oz (116 kg)   BMI 38.89 kg/m          ASSESSMENT:  Diabetes type 2, recent BMI 38  See history of present illness for detailed discussion of current diabetes management, blood sugar patterns and problems identified   Her A1c is 7.2 but her blood sugars at home are averaging 184 recently  She said that she has been inconsistent with her diet and not watching what she is eating with several readings over 200 after meals Also appears that her fasting readings are relatively higher on Toujeo compared to the same dose of Lantus Has gained weight She used to walk and exercise up to 1 hour regularly but has been very inconsistent with this and only sporadically walking about 15 minutes on her breaks at work  She has not seen the dietitian as recommended   PLAN:   Although she may need mealtime insulin coverage she does not want to change her regimen at this time However she agrees to  see the dietitian She will also start walking or going to the gym regularly Given her instructions on how to adjust her Toujeo based on fasting blood sugars every 3 days to get them down below 130 consistently, associate was explained and the starting dose tomorrow will be 74 units She will also go up to 1.5 mg Trulicity weekly May consider Ozempic if this is not as effective  She does need to be seen more regularly but she wants to wait 3 months to reassess her A1c  There are no Patient Instructions on file for this visit.      Elayne Snare 03/03/2018, 3:52 PM   Note: This office note was prepared with Dragon voice recognition system technology. Any transcriptional errors that result from this process are unintentional.

## 2018-03-11 ENCOUNTER — Encounter: Payer: Self-pay | Admitting: Family

## 2018-03-26 ENCOUNTER — Encounter: Payer: Self-pay | Admitting: Family

## 2018-03-26 ENCOUNTER — Ambulatory Visit: Payer: Managed Care, Other (non HMO) | Admitting: Family

## 2018-03-26 VITALS — BP 138/66 | HR 79 | Temp 98.4°F | Resp 18 | Ht 68.0 in | Wt 256.0 lb

## 2018-03-26 DIAGNOSIS — E1165 Type 2 diabetes mellitus with hyperglycemia: Secondary | ICD-10-CM

## 2018-03-26 DIAGNOSIS — Z23 Encounter for immunization: Secondary | ICD-10-CM | POA: Diagnosis not present

## 2018-03-26 DIAGNOSIS — I10 Essential (primary) hypertension: Secondary | ICD-10-CM | POA: Diagnosis not present

## 2018-03-26 NOTE — Progress Notes (Signed)
Subjective:    Patient ID: Tanya Wilcox, female    DOB: 01-01-1971, 47 y.o.   MRN: [id number]  HPI  HTN-  Maintained on lisinopril and as needed lasix. Uses lasix about 4-5 times week BP Readings from Last 3 Encounters:  03/26/18 138/66  03/03/18 (!) 144/88  12/30/17 132/80   DM2- reports that her fasting sugars have been stable.   Lab Results  Component Value Date   HGBA1C 7.2 (H) 02/28/2018   HGBA1C 8.9 (H) 09/23/2017   HGBA1C 8.5 (H) 06/21/2017   Lab Results  Component Value Date   MICROALBUR <0.7 11/19/2016   LDLCALC 70 09/23/2017   CREATININE 0.93 02/28/2018   Morbid obesity-she has weight watchers meetings at work.  Plans to rejoin the gym.   Wt Readings from Last 3 Encounters:  03/26/18 256 lb (116.1 kg)  03/03/18 255 lb 12.8 oz (116 kg)  12/30/17 250 lb 3.2 oz (113.5 kg)      Review of Systems See HPI  Past Medical History:  Diagnosis Date  . Alcohol use   . Anxiety   . Bipolar disorder ([place])   . Depression   . Gestational diabetes    borderline II  . Osteoarthritis    knee  . Tobacco abuse      Social History   Socioeconomic History  . Marital status: Single    Spouse name: Not on file  . Number of children: Not on file  . Years of education: Not on file  . Highest education level: Not on file  Occupational History  . Occupation: Sport and exercise psychologist: [employer]  Social Needs  . Financial resource strain: Not on file  . Food insecurity:    Worry: Not on file    Inability: Not on file  . Transportation needs:    Medical: Not on file    Non-medical: Not on file  Tobacco Use  . Smoking status: Current Every Day Smoker    Types: Cigarettes    Last attempt to quit: 01/20/2013    Years since quitting: 5.1  . Smokeless tobacco: Never Used  . Tobacco comment: 1 pack per day  Substance and Sexual Activity  . Alcohol use: Yes    Alcohol/week: 0.0 standard drinks    Comment: occasionally  . Drug use: No  . Sexual activity: Not on file    Lifestyle  . Physical activity:    Days per week: Not on file    Minutes per session: Not on file  . Stress: Not on file  Relationships  . Social connections:    Talks on phone: Not on file    Gets together: Not on file    Attends religious service: Not on file    Active member of club or organization: Not on file    Attends meetings of clubs or organizations: Not on file    Relationship status: Not on file  . Intimate partner violence:    Fear of current or ex partner: Not on file    Emotionally abused: Not on file    Physically abused: Not on file    Forced sexual activity: Not on file  Other Topics Concern  . Not on file  Social History Narrative   Lives with daughter   Works at [employer]   Enjoys sleeping   1 dog   Completed high school, some college    Past Surgical History:  Procedure Laterality Date  . CHOLECYSTECTOMY  2003  Family History  Problem Relation Age of Onset  . Hypertension Mother   . Hyperlipidemia Mother   . Alcohol abuse Father     Allergies  Allergen Reactions  . Invokana [Canagliflozin] Other (See Comments)    Recurrent Yeast infections    Current Outpatient Medications on File Prior to Visit  Medication Sig Dispense Refill  . ALPRAZolam (XANAX) 0.5 MG tablet Take 0.5 mg by mouth 2 (two) times daily.  1  . ARIPiprazole (ABILIFY) 5 MG tablet Take 1 tablet by mouth daily.  1  . aspirin EC 81 MG tablet Take 81 mg by mouth daily.    Marland Kitchen atorvastatin (LIPITOR) 40 MG tablet Take 1 tablet (40 mg total) by mouth daily. 90 tablet 1  . Dulaglutide (TRULICITY) 1.5 PZ/0.2HE SOPN Inject weekly 12 pen 0  . furosemide (LASIX) 20 MG tablet TAKE 1 TABLET(20 MG) BY MOUTH DAILY AS NEEDED FOR SWELLING 90 tablet 1  . glucose blood (ONE TOUCH ULTRA TEST) test strip TEST 3 TIMES DAILY BEFORE  MEALS AS DIRECTED 300 each 5  . hydrOXYzine (VISTARIL) 25 MG capsule Take 25 mg by mouth at bedtime as needed for sleep.  1  . Insulin Glargine (LANTUS  SOLOSTAR) 100 UNIT/ML Solostar Pen Inject 80 Units into the skin daily at 10 pm. (Patient taking differently: Inject 70 Units into the skin daily at 10 pm. ) 24 pen 1  . Insulin Glargine (TOUJEO MAX SOLOSTAR) 300 UNIT/ML SOPN Inject 70 Units into the skin daily. 4 pen 3  . Insulin Pen Needle (B-D ULTRAFINE III SHORT PEN) 31G X 8 MM MISC Use to inject insulin once daily. 100 each 1  . levonorgestrel (MIRENA) 20 MCG/24HR IUD 1 each by Intrauterine route once.    Marland Kitchen lisinopril (PRINIVIL,ZESTRIL) 20 MG tablet Take 1 tablet (20 mg total) by mouth daily. 90 tablet 1  . metFORMIN (GLUCOPHAGE-XR) 500 MG 24 hr tablet Take 3 tablets (1,500 mg total) by mouth daily with supper. 90 tablet 3  . Multiple Vitamin (MULTIVITAMIN) tablet Take 1 tablet by mouth daily.    . nicotine (NICODERM CQ - DOSED IN MG/24 HOURS) 21 mg/24hr patch APPLY 1 PATCH ONTO SKIN  DAILY 42 patch 1  . sertraline (ZOLOFT) 25 MG tablet Take 25 mg by mouth daily.  1  . valACYclovir (VALTREX) 500 MG tablet TAKE 1 TABLET BY MOUTH  EVERY DAY 90 tablet 1   No current facility-administered medications on file prior to visit.     BP 138/66 (BP Location: Right Arm, Cuff Size: Large)   Pulse 79   Temp 98.4 F (36.9 C) (Oral)   Resp 18   Ht 5\' 8"  (1.727 m)   Wt 256 lb (116.1 kg)   SpO2 98%   BMI 38.92 kg/m       Objective:   Physical Exam  Constitutional: She appears well-developed and well-nourished.  Cardiovascular: Normal rate, regular rhythm and normal heart sounds.  No murmur heard. Pulmonary/Chest: Effort normal and breath sounds normal. No respiratory distress. She has no wheezes.  Psychiatric: She has a normal mood and affect. Her behavior is normal. Judgment and thought content normal.          Assessment & Plan:  Hypertension- blood pressure is stable on current medications.  Continue same.  Diabetes type 2-glycemic control has improved.  This is now being managed by endocrinology.  Morbid obesity- we discussed  working on healthy diet, exercise, and weight loss.  She would like to see what she can  do on her own and may consider a referral to bariatric surgery next year if she is not able to lose the weight on her own.  Form was filled for work as she did not meet her BMI requirements.

## 2018-03-26 NOTE — Patient Instructions (Signed)
Please work hard on healthy diet, exercise and weight loss.

## 2018-03-28 ENCOUNTER — Encounter: Payer: Managed Care, Other (non HMO) | Attending: Endocrinology | Admitting: Dietician

## 2018-03-28 ENCOUNTER — Encounter: Payer: Self-pay | Admitting: Dietician

## 2018-03-28 DIAGNOSIS — E1165 Type 2 diabetes mellitus with hyperglycemia: Secondary | ICD-10-CM | POA: Insufficient documentation

## 2018-03-28 DIAGNOSIS — Z6839 Body mass index (BMI) 39.0-39.9, adult: Secondary | ICD-10-CM | POA: Diagnosis not present

## 2018-03-28 DIAGNOSIS — Z713 Dietary counseling and surveillance: Secondary | ICD-10-CM | POA: Insufficient documentation

## 2018-03-28 NOTE — Patient Instructions (Signed)
See Plant Based handout Find new ways to add more non starchy vegetables with each meal. Low fat Great job on the changes that you have! Find ways to be more active.  Aim to be more active.  Walking away the pounds  Gym

## 2018-04-04 NOTE — Progress Notes (Signed)
Diabetes Self-Management Education  Visit Type: First/Initial  Appt. Start Time: 1400 Appt. End Time: 8841  04/04/2018  Ms. Tanya Wilcox, identified by name and date of birth, is a 47 y.o. female with a diagnosis of Diabetes: Type 2. Other history includes biipolar depression, GDM, smoking.  She wants to try quitting smoking again. Medications include Toujeo 74 units q am, Trulicity, Metformin XR but has not taking for the past 2 weeks. She has been doing a Green Smoothie cleanse for the past 3 days.  Her weight is 250 lbs decreased from 255 lbs 1 month ago.  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  She and her 86 yo daughter live together.  She is lactose intolerant.  She has joined [employer] at work. She is interested in the [employer].  Will request a prescription. She saw Bev. RD. CDE om 2014 and weight was 249 lbs at that time.  ASSESSMENT  Height 5\' 7"  (1.702 m), weight 250 lb (113.4 kg). Body mass index is 39.16 kg/m.  Diabetes Self-Management Education - 03/28/18 1429      Visit Information   Visit Type  First/Initial      Initial Visit   Diabetes Type  Type 2    Are you currently following a meal plan?  Yes    What type of meal plan do you follow?  green smoothie cleanse    Are you taking your medications as prescribed?  Yes    Date Diagnosed  2007      Health Coping   How would you rate your overall health?  Fair      Psychosocial Assessment   Patient Belief/Attitude about Diabetes  Motivated to manage diabetes    Self-care barriers  None    Self-management support  Doctor's office;Friends    Other persons present  Patient    Patient Concerns  Nutrition/Meal planning;Glycemic Control;Weight Control;Healthy Lifestyle    Special Needs  None    Preferred Learning Style  No preference indicated    Learning Readiness  Ready    How often do you need to have someone help you when you read instructions, pamphlets, or other written materials from your doctor or pharmacy?  1 -  Never    What is the last grade level you completed in school?  12th grade      Pre-Education Assessment   Patient understands the diabetes disease and treatment process.  Needs Review    Patient understands incorporating nutritional management into lifestyle.  Needs Review    Patient undertands incorporating physical activity into lifestyle.  Needs Review    Patient understands using medications safely.  Needs Review    Patient understands monitoring blood glucose, interpreting and using results  Needs Review    Patient understands prevention, detection, and treatment of acute complications.  Needs Review    Patient understands prevention, detection, and treatment of chronic complications.  Needs Review    Patient understands how to develop strategies to address psychosocial issues.  Needs Review    Patient understands how to develop strategies to promote health/change behavior.  Needs Review      Complications   Last HgB A1C per patient/outside source  7.2 %   02/28/18 decreased from 8.9% 09/2017   How often do you check your blood sugar?  1-2 times/day    Fasting Blood glucose range (mg/dL)  70-129    Number of hypoglycemic episodes per month  0    Number of hyperglycemic episodes per week  0    Have you had a dilated eye exam in the past 12 months?  Yes    Have you had a dental exam in the past 12 months?  No    Are you checking your feet?  Yes    How many days per week are you checking your feet?  7      Dietary Intake   Breakfast  Green Smoothie (mixed greens, apple, banana, flax seed, stevia) Previously almonds and boiled egg or skipped    Snack (morning)  celery, cucumbers, natural peanut butter, boiled egg, tuna     Lunch  Green Smoothie Previously Hotdog, wopper, or salad or other out to eat.    Snack (afternoon)  same as am    Dinner  [employer] - Previously [employer] from lunch or skipped    Snack (evening)  none    Beverage(s)  yogi peach detox tea, water - Previously  Regular or diet soda with caffeine, coffee with stevia and SF hazel nut cream      Exercise   Exercise Type  ADL's   Membership to [employer]     Patient Education   Previous Diabetes Education  Yes (please comment)   2014 with Bev   Disease state   Other (comment)   review insulin resistance   Nutrition management   Role of diet in the treatment of diabetes and the relationship between the three main macronutrients and blood glucose level;Meal options for control of blood glucose level and chronic complications.;Information on hints to eating out and maintain blood glucose control.    Physical activity and exercise   Role of exercise on diabetes management, blood pressure control and cardiac health.    Medications  Reviewed patients medication for diabetes, action, purpose, timing of dose and side effects.    Monitoring  Purpose and frequency of SMBG.;Identified appropriate SMBG and/or A1C goals.    Acute complications  Taught treatment of hypoglycemia - the 15 rule.    Psychosocial adjustment  Worked with patient to identify barriers to care and solutions;Role of stress on diabetes    Personal strategies to promote health  Lifestyle issues that need to be addressed for better diabetes care      Individualized Goals (developed by patient)   Nutrition  General guidelines for healthy choices and portions discussed    Physical Activity  Exercise 3-5 times per week;30 minutes per day    Medications  take my medication as prescribed    Monitoring   test my blood glucose as discussed    Problem Solving  healthier options when eating out    Reducing Risk  examine blood glucose patterns    Health Coping  discuss diabetes with (comment)   DM, CDE, RD     Post-Education Assessment   Patient understands the diabetes disease and treatment process.  Demonstrates understanding / competency    Patient understands incorporating nutritional management into lifestyle.  Demonstrates understanding /  competency    Patient undertands incorporating physical activity into lifestyle.  Demonstrates understanding / competency    Patient understands using medications safely.  Demonstrates understanding / competency    Patient understands monitoring blood glucose, interpreting and using results  Demonstrates understanding / competency    Patient understands prevention, detection, and treatment of acute complications.  Demonstrates understanding / competency    Patient understands prevention, detection, and treatment of chronic complications.  Demonstrates understanding / competency    Patient understands how to develop strategies to  address psychosocial issues.  Demonstrates understanding / competency    Patient understands how to develop strategies to promote health/change behavior.  Demonstrates understanding / competency      Outcomes   Expected Outcomes  Demonstrated interest in learning. Expect positive outcomes    Future DMSE  PRN    Program Status  Completed       Individualized Plan for Diabetes Self-Management Training:   Learning Objective:  Patient will have a greater understanding of diabetes self-management. Patient education plan is to attend individual and/or group sessions per assessed needs and concerns.   Plan:   Patient Instructions  See Plant Based handout Find new ways to add more non starchy vegetables with each meal. Low fat Great job on the changes that you have! Find ways to be more active.  Aim to be more active.  Walking away the pounds  Gym    Expected Outcomes:  Demonstrated interest in learning. Expect positive outcomes  Education material provided: ADA Diabetes: Your Take Control Guide, Food label handouts, Meal plan card, My Plate and Snack sheet, breakfast ideas, eating out  If problems or questions, patient to contact team via:  Phone  Future DSME appointment: PRN

## 2018-04-25 ENCOUNTER — Ambulatory Visit: Payer: Managed Care, Other (non HMO) | Admitting: Dietician

## 2018-05-29 ENCOUNTER — Other Ambulatory Visit: Payer: Self-pay | Admitting: Endocrinology

## 2018-06-02 ENCOUNTER — Other Ambulatory Visit (INDEPENDENT_AMBULATORY_CARE_PROVIDER_SITE_OTHER): Payer: Managed Care, Other (non HMO)

## 2018-06-02 DIAGNOSIS — E1165 Type 2 diabetes mellitus with hyperglycemia: Secondary | ICD-10-CM | POA: Diagnosis not present

## 2018-06-02 DIAGNOSIS — Z794 Long term (current) use of insulin: Secondary | ICD-10-CM | POA: Diagnosis not present

## 2018-06-02 LAB — BASIC METABOLIC PANEL
BUN: 13 mg/dL (ref 6–23)
CO2: 25 mEq/L (ref 19–32)
CREATININE: 0.81 mg/dL (ref 0.40–1.20)
Calcium: 9 mg/dL (ref 8.4–10.5)
Chloride: 106 mEq/L (ref 96–112)
GFR: 97.38 mL/min (ref >60.00)
Glucose, Bld: 153 mg/dL — ABNORMAL HIGH (ref 70–99)
POTASSIUM: 3.8 meq/L (ref 3.5–5.1)
Sodium: 137 mEq/L (ref 135–145)

## 2018-06-02 LAB — HEMOGLOBIN A1C: HEMOGLOBIN A1C: 6.7 % — AB (ref 4.6–6.5)

## 2018-06-05 ENCOUNTER — Ambulatory Visit: Payer: Managed Care, Other (non HMO) | Admitting: Endocrinology

## 2018-06-05 ENCOUNTER — Encounter: Payer: Self-pay | Admitting: Endocrinology

## 2018-06-05 VITALS — BP 124/82 | HR 88 | Ht 67.0 in | Wt 250.0 lb

## 2018-06-05 DIAGNOSIS — Z794 Long term (current) use of insulin: Secondary | ICD-10-CM | POA: Diagnosis not present

## 2018-06-05 DIAGNOSIS — E1165 Type 2 diabetes mellitus with hyperglycemia: Secondary | ICD-10-CM

## 2018-06-05 NOTE — Patient Instructions (Addendum)
Take 1 Metfromin at dinner  Exercise daily  More sugars after dinner  Check blood sugars on waking up 4 days a week  Also check blood sugars about 2 hours after meals and do this after different meals by rotation  Recommended blood sugar levels on waking up are 90-130 and about 2 hours after meal is 130-160  Please bring your blood sugar monitor to each visit, thank you

## 2018-06-05 NOTE — Progress Notes (Signed)
Patient ID: Tanya Wilcox, female   DOB: 1971-06-15, 47 y.o.   MRN: [id number]          Reason for Appointment: Follow-up for Type 2 Diabetes  Referring PCP: Debbrah Alar   History of Present Illness:          Date of diagnosis of type 2 diabetes mellitus: 2007       Background history:   She does not remember the initial symptoms at diagnosis and level of blood sugar and no previous records available Her A1c was 7% in 2014 and as high as 9.9 in 2015 She has previously taken metformin, Amaryl, Invokana, Actos and Januvia with variable level of control Lowest A1c was 6.4 in 2017 She was started in  7/18 on basal insulin using Basaglar presumably for worsening blood sugar control  Recent history:   INSULIN regimen is: 74- 78 units of TOUJEO insulin, in am  Non-insulin hypoglycemic drugs the patient is taking are: Trulicity 1.5 mg weekly, metformin ER 1500 mg daily  Current management, blood sugar patterns and problems identified:  She has seen the dietitian since her last visit  With this she has lost 5 pounds that she had gained  Also her Trulicity was increased up to 1.5 mg  However blood sugars are not being checked very much lately  She does have excellent fasting blood sugars but has checked only once this month  She had used a titration schedule to increase her basal insulin but today took only 74 units even though with 78 units she was doing a little better  She is much taking readings AFTER dinner but her blood sugars after breakfast and lunch are appearing relatively good; on her previous visits her blood sugars were as high as 286 after meals  She has not exercised lately  She is trying to do more vegetarian meals after talking to the dietitian She said that she cannot tolerate metformin and stopped it altogether       Side effects from medications have been: Nausea from 2000 mg of metformin, recurrent yeast infections with Invokana  Compliance with the  medical regimen:  Hypoglycemia:    Glucose monitoring:  done less than 1 times a day         Glucometer: One Touch.       Blood Glucose readings by time of day and averages from meter download:   PRE-MEAL Fasting Lunch Dinner Bedtime Overall  Glucose range:  106-149  132-157     Mean/median:  124    132   POST-MEAL PC Breakfast PC Lunch PC Dinner  Glucose range:  158-175  102, 139   Mean/median:        Self-care: The diet that the patient has been following is: tries to limit carbs.      Meal times are:  Breakfast is at 6 AM lunch: 1 PM dinner: 4:30 PM Typical meal intake: Breakfast is eggs or chicken wings.  Lunch is usually a salad and evening meal is chicken wings.  May have snacks with grapes, candy and pickles               Dietician visit, most recent: 03/2018               Exercise:  0-15 minutes walking at times   Weight history:  Wt Readings from Last 3 Encounters:  06/05/18 250 lb (113.4 kg)  03/28/18 250 lb (113.4 kg)  03/26/18 256 lb (116.1 kg)  Glycemic control:   Lab Results  Component Value Date   HGBA1C 6.7 (H) 06/02/2018   HGBA1C 7.2 (H) 02/28/2018   HGBA1C 8.9 (H) 09/23/2017   Lab Results  Component Value Date   MICROALBUR <0.7 11/19/2016   LDLCALC 70 09/23/2017   CREATININE 0.81 06/02/2018   Lab Results  Component Value Date   MICRALBCREAT 1.1 11/19/2016    No results found for: FRUCTOSAMINE    Allergies as of 06/05/2018      Reactions   Invokana [canagliflozin] Other (See Comments)   Recurrent Yeast infections      Medication List        Accurate as of 06/05/18  4:06 PM. Always use your most recent med list.          ALPRAZolam 0.5 MG tablet Commonly known as:  XANAX Take 0.5 mg by mouth 2 (two) times daily.   ARIPiprazole 5 MG tablet Commonly known as:  ABILIFY Take 1 tablet by mouth daily.   aspirin EC 81 MG tablet Take 81 mg by mouth daily.   atorvastatin 40 MG tablet Commonly known as:  LIPITOR Take 1  tablet (40 mg total) by mouth daily.   furosemide 20 MG tablet Commonly known as:  LASIX TAKE 1 TABLET(20 MG) BY MOUTH DAILY AS NEEDED FOR SWELLING   glucose blood test strip TEST 3 TIMES DAILY BEFORE  MEALS AS DIRECTED   hydrOXYzine 25 MG capsule Commonly known as:  VISTARIL Take 25 mg by mouth at bedtime as needed for sleep.   Insulin Glargine 100 UNIT/ML Solostar Pen Commonly known as:  LANTUS Inject 80 Units into the skin daily at 10 pm.   Insulin Glargine 300 UNIT/ML Sopn Inject 70 Units into the skin daily.   Insulin Pen Needle 31G X 8 MM Misc Use to inject insulin once daily.   levonorgestrel 20 MCG/24HR IUD Commonly known as:  MIRENA 1 each by Intrauterine route once.   lisinopril 20 MG tablet Commonly known as:  PRINIVIL,ZESTRIL Take 1 tablet (20 mg total) by mouth daily.   metFORMIN 500 MG 24 hr tablet Commonly known as:  GLUCOPHAGE-XR Take 3 tablets (1,500 mg total) by mouth daily with supper.   multivitamin tablet Take 1 tablet by mouth daily.   nicotine 21 mg/24hr patch Commonly known as:  NICODERM CQ - dosed in mg/24 hours APPLY 1 PATCH ONTO SKIN  DAILY   sertraline 25 MG tablet Commonly known as:  ZOLOFT Take 25 mg by mouth daily.   TRULICITY 1.5 BP/1.0CH Sopn Generic drug:  Dulaglutide INJECT 1 PEN INTO THE SKIN WEEKLY   valACYclovir 500 MG tablet Commonly known as:  VALTREX TAKE 1 TABLET BY MOUTH  EVERY DAY       Allergies:  Allergies  Allergen Reactions  . Invokana [Canagliflozin] Other (See Comments)    Recurrent Yeast infections    Past Medical History:  Diagnosis Date  . Alcohol use   . Anxiety   . Bipolar disorder ([place])   . Depression   . Gestational diabetes    borderline II  . Osteoarthritis    knee  . Tobacco abuse     Past Surgical History:  Procedure Laterality Date  . CHOLECYSTECTOMY  2003    Family History  Problem Relation Age of Onset  . Hypertension Mother   . Hyperlipidemia Mother   . Alcohol abuse  Father     Social History:  reports that she has been smoking cigarettes. She has never used smokeless tobacco. She  reports that she drinks alcohol. She reports that she does not use drugs.   Review of Systems   Lipid history: On treatment by PCP with Lipitor 40 mg    Lab Results  Component Value Date   CHOL 119 09/23/2017   HDL 32.20 (L) 09/23/2017   LDLCALC 70 09/23/2017   TRIG 83.0 09/23/2017   CHOLHDL 4 09/23/2017           Hypertension: Treated with lisinopril 20 mg  BP Readings from Last 3 Encounters:  06/05/18 124/82  03/26/18 138/66  03/03/18 (!) 144/88    Most recent eye exam was in 09/2016, reportedly no retinopathy  Most recent foot exam: 10/2017    LABS:  Lab on 06/02/2018  Component Date Value Ref Range Status  . Sodium 06/02/2018 137  135 - 145 mEq/L Final  . Potassium 06/02/2018 3.8  3.5 - 5.1 mEq/L Final  . Chloride 06/02/2018 106  96 - 112 mEq/L Final  . CO2 06/02/2018 25  19 - 32 mEq/L Final  . Glucose, Bld 06/02/2018 153* 70 - 99 mg/dL Final  . BUN 06/02/2018 13  6 - 23 mg/dL Final  . Creatinine, Ser 06/02/2018 0.81  0.40 - 1.20 mg/dL Final  . Calcium 06/02/2018 9.0  8.4 - 10.5 mg/dL Final  . GFR 06/02/2018 97.38  >60.00 mL/min Final  . Hgb A1c MFr Bld 06/02/2018 6.7* 4.6 - 6.5 % Final   Glycemic Control Guidelines for People with Diabetes:Non Diabetic:  <6%Goal of Therapy: <7%Additional Action Suggested:  >8%     Physical Examination:  BP 124/82   Pulse 88   Ht 5\' 7"  (1.702 m)   Wt 250 lb (113.4 kg)   SpO2 98%   BMI 39.16 kg/m          ASSESSMENT:  Diabetes type 2, recent BMI 38  See history of present illness for detailed discussion of current diabetes management, blood sugar patterns and problems identified   Her A1c is 6.7, previously 7.2  With improving her diet and also increasing her Trulicity she is able to cut back on portions and likely has less postprandial hyperglycemia Fasting readings are also better overall with  highest reading 149 recently However has not done much monitoring  She wants to do the gastric bypass surgery and is not focusing on monitoring her diabetes is much or exercising recently Also requiring a little more basal insulin with stopping metformin    PLAN:   Consistent exercise Try at least 500 mg metformin at dinner and increase if tolerated Stay on 78 units of insulin More consistent monitoring especially after eating meals Encouraged her to have protein with every meal even if trying vegetarian choices Follow-up in 3 months  There are no Patient Instructions on file for this visit.      Elayne Snare 06/05/2018, 4:06 PM   Note: This office note was prepared with Dragon voice recognition system technology. Any transcriptional errors that result from this process are unintentional.

## 2018-06-10 ENCOUNTER — Other Ambulatory Visit (HOSPITAL_COMMUNITY): Payer: Self-pay | Admitting: General Surgery

## 2018-06-23 ENCOUNTER — Ambulatory Visit: Payer: Managed Care, Other (non HMO) | Admitting: Skilled Nursing Facility1

## 2018-06-26 ENCOUNTER — Ambulatory Visit (HOSPITAL_COMMUNITY): Payer: Managed Care, Other (non HMO)

## 2018-06-27 ENCOUNTER — Ambulatory Visit: Payer: Managed Care, Other (non HMO) | Admitting: Family

## 2018-06-27 ENCOUNTER — Encounter: Payer: Self-pay | Admitting: Family

## 2018-06-27 VITALS — BP 140/78 | HR 83 | Temp 98.6°F | Resp 18 | Ht 68.0 in | Wt 249.6 lb

## 2018-06-27 DIAGNOSIS — E785 Hyperlipidemia, unspecified: Secondary | ICD-10-CM

## 2018-06-27 DIAGNOSIS — E119 Type 2 diabetes mellitus without complications: Secondary | ICD-10-CM

## 2018-06-27 DIAGNOSIS — J40 Bronchitis, not specified as acute or chronic: Secondary | ICD-10-CM

## 2018-06-27 DIAGNOSIS — I10 Essential (primary) hypertension: Secondary | ICD-10-CM

## 2018-06-27 DIAGNOSIS — Z794 Long term (current) use of insulin: Secondary | ICD-10-CM

## 2018-06-27 DIAGNOSIS — Z720 Tobacco use: Secondary | ICD-10-CM

## 2018-06-27 MED ORDER — AZITHROMYCIN 250 MG PO TABS: ORAL_TABLET | ORAL | 0 refills | Status: DC

## 2018-06-27 MED ORDER — FLUTICASONE PROPIONATE HFA 110 MCG/ACT IN AERO: 2.0000 | INHALATION_SPRAY | Freq: Two times a day (BID) | RESPIRATORY_TRACT | 0 refills | Status: DC

## 2018-06-27 MED ORDER — ALBUTEROL SULFATE HFA 108 (90 BASE) MCG/ACT IN AERS: 2.0000 | INHALATION_SPRAY | Freq: Four times a day (QID) | RESPIRATORY_TRACT | 0 refills | Status: DC | PRN

## 2018-06-27 NOTE — Patient Instructions (Addendum)
Please begin azithromycin. Begin flovent 2 puffs twice a day for 2 weeks. You may use albuterol 2 puffs every 6 hours as needed for cough or wheezing. Call if your symptoms worsen, if you develop fever or if not improved in 3-4 days. Send me your psychiatrist's name/number so we can coordinate your smoking cessation medication.

## 2018-06-27 NOTE — Progress Notes (Signed)
Subjective:    Patient ID: Tanya Wilcox, female    DOB: 1970-11-06, 47 y.o.   MRN: [id number]  HPI  Patient is a 47 yr old female who presents today for follow up.  1) HTN- mainained on lisinopril.   BP Readings from Last 3 Encounters:  06/27/18 140/78  06/05/18 124/82  03/26/18 138/66   2) DM2- this is being managed by Endo.  Lab Results  Component Value Date   HGBA1C 6.7 (H) 06/02/2018   HGBA1C 7.2 (H) 02/28/2018   HGBA1C 8.9 (H) 09/23/2017   Lab Results  Component Value Date   MICROALBUR <0.7 11/19/2016   LDLCALC 70 09/23/2017   CREATININE 0.81 06/02/2018   3) Hyperlipidemia- maintained on lipitor. Denies myalgia.    4) Cough- reports that sick was out of work x 1 week with cough/nasal congestion.  This was 3 weeks ago. Now she has a dry cough.  Denies fever or sob. Still has some nasal congestion/sinus pressure, nasal drainage.    5) Tobacco abuse-  Reports surgeon recommended Roux-en-Y.  They are recommending.  Rep  [facility]    Review of Systems See HPI  Past Medical History:  Diagnosis Date  . Alcohol use   . Anxiety   . Bipolar disorder ([place])   . Depression   . Gestational diabetes    borderline II  . Osteoarthritis    knee  . Tobacco abuse      Social History   Socioeconomic History  . Marital status: Single    Spouse name: Not on file  . Number of children: Not on file  . Years of education: Not on file  . Highest education level: Not on file  Occupational History  . Occupation: Sport and exercise psychologist: [employer]  Social Needs  . Financial resource strain: Not on file  . Food insecurity:    Worry: Not on file    Inability: Not on file  . Transportation needs:    Medical: Not on file    Non-medical: Not on file  Tobacco Use  . Smoking status: Current Every Day Smoker    Types: Cigarettes    Last attempt to quit: 01/20/2013    Years since quitting: 5.4  . Smokeless tobacco: Never Used  . Tobacco  comment: 1 pack per day  Substance and Sexual Activity  . Alcohol use: Yes    Alcohol/week: 0.0 standard drinks    Comment: occasionally  . Drug use: No  . Sexual activity: Not on file  Lifestyle  . Physical activity:    Days per week: Not on file    Minutes per session: Not on file  . Stress: Not on file  Relationships  . Social connections:    Talks on phone: Not on file    Gets together: Not on file    Attends religious service: Not on file    Active member of club or organization: Not on file    Attends meetings of clubs or organizations: Not on file    Relationship status: Not on file  . Intimate partner violence:    Fear of current or ex partner: Not on file    Emotionally abused: Not on file    Physically abused: Not on file    Forced sexual activity: Not on file  Other Topics Concern  . Not on file  Social History Narrative   Lives with daughter   Works at [employer] and [employer]  Enjoys sleeping   1 dog   Completed high school, some college    Past Surgical History:  Procedure Laterality Date  . CHOLECYSTECTOMY  2003    Family History  Problem Relation Age of Onset  . Hypertension Mother   . Hyperlipidemia Mother   . Alcohol abuse Father     Allergies  Allergen Reactions  . Invokana [Canagliflozin] Other (See Comments)    Recurrent Yeast infections    Current Outpatient Medications on File Prior to Visit  Medication Sig Dispense Refill  . ALPRAZolam (XANAX) 0.5 MG tablet Take 0.5 mg by mouth 2 (two) times daily.  1  . ARIPiprazole (ABILIFY) 5 MG tablet Take 1 tablet by mouth daily.  1  . aspirin EC 81 MG tablet Take 81 mg by mouth daily.    Marland Kitchen atorvastatin (LIPITOR) 40 MG tablet Take 1 tablet (40 mg total) by mouth daily. 90 tablet 1  . furosemide (LASIX) 20 MG tablet TAKE 1 TABLET(20 MG) BY MOUTH DAILY AS NEEDED FOR SWELLING 90 tablet 1  . glucose blood (ONE TOUCH ULTRA TEST) test strip TEST 3 TIMES DAILY BEFORE  MEALS AS DIRECTED 300 each 5    . hydrOXYzine (VISTARIL) 25 MG capsule Take 25 mg by mouth at bedtime as needed for sleep.  1  . Insulin Glargine (TOUJEO MAX SOLOSTAR) 300 UNIT/ML SOPN Inject 70 Units into the skin daily. 4 pen 3  . Insulin Pen Needle (B-D ULTRAFINE III SHORT PEN) 31G X 8 MM MISC Use to inject insulin once daily. 100 each 1  . levonorgestrel (MIRENA) 20 MCG/24HR IUD 1 each by Intrauterine route once.    Marland Kitchen lisinopril (PRINIVIL,ZESTRIL) 20 MG tablet Take 1 tablet (20 mg total) by mouth daily. 90 tablet 1  . metFORMIN (GLUCOPHAGE-XR) 500 MG 24 hr tablet Take 3 tablets (1,500 mg total) by mouth daily with supper. 90 tablet 3  . Multiple Vitamin (MULTIVITAMIN) tablet Take 1 tablet by mouth daily.    . nicotine (NICODERM CQ - DOSED IN MG/24 HOURS) 21 mg/24hr patch APPLY 1 PATCH ONTO SKIN  DAILY 42 patch 1  . sertraline (ZOLOFT) 25 MG tablet Take 25 mg by mouth daily.  1  . TRULICITY 1.5 EA/5.4UJ SOPN INJECT 1 PEN INTO THE SKIN WEEKLY 6 mL 0  . valACYclovir (VALTREX) 500 MG tablet TAKE 1 TABLET BY MOUTH  EVERY DAY 90 tablet 1   No current facility-administered medications on file prior to visit.     BP 140/78 (BP Location: Right Arm, Cuff Size: Large)   Pulse 83   Temp 98.6 F (37 C) (Oral)   Resp 18   Ht 5\' 8"  (1.727 m)   Wt 249 lb 9.6 oz (113.2 kg)   SpO2 100%   BMI 37.95 kg/m       Objective:   Physical Exam  Constitutional: She is oriented to person, place, and time. She appears well-developed and well-nourished.  HENT:  Right Ear: Tympanic membrane and ear canal normal.  Left Ear: Tympanic membrane and ear canal normal.  Neck: Neck supple. No thyromegaly present.  Cardiovascular: Normal rate, regular rhythm and normal heart sounds.  No murmur heard. Pulmonary/Chest: Effort normal and breath sounds normal. No respiratory distress.  Soft left sided expiratory wheeze + upper airway rhonchi which cleared with cough  Neurological: She is alert and oriented to person, place, and time.  Skin: Skin  is warm and dry.  Psychiatric: She has a normal mood and affect. Her behavior is normal. Judgment  and thought content normal.          Assessment & Plan:  Bronchitis with bronchospasm- will rx with zpak, flovent, albuterol. Pt is advised to call if symptoms worsen or if not improved in 3-4 days.  HTN-fair bp, conitnue lisinopril.  Hyperlipidemia- LDL at goal.  DM2- improving-management per endo.  Tobacco abuse- still smoking 5 cigarettes a day on patch. I would like to coordinate initiation of zyban with her psychiatrist. She will send me contact info for her psychiatrist.

## 2018-07-07 ENCOUNTER — Encounter: Payer: Self-pay | Admitting: Family

## 2018-07-24 ENCOUNTER — Encounter: Payer: Managed Care, Other (non HMO) | Attending: General Surgery | Admitting: Dietician

## 2018-07-24 ENCOUNTER — Encounter: Payer: Self-pay | Admitting: Dietician

## 2018-07-24 VITALS — Ht 67.5 in | Wt 245.2 lb

## 2018-07-24 DIAGNOSIS — E669 Obesity, unspecified: Secondary | ICD-10-CM | POA: Diagnosis not present

## 2018-07-24 NOTE — Progress Notes (Signed)
Bariatric Pre-Op Nutrition Assessment for Planned Sleeve Gastrectomy Surgery Medical Nutrition Therapy  Appt Start Time: 8:00am  End time: 8:45am  Patient was seen on 07/24/2018 for Pre-Operative Nutrition Assessment. Assessment and letter of approval faxed to [facility] Bariatric Surgery Program coordinator on 07/24/2018.   Pt expectation of surgery: to lose weight and be able to come off medications Pt expectation of dietitian: to assist with eating habits before surgery  Anthropometrics  Start weight at [facility]: 245.2 lbs Height: 67.5 in BMI: 37.8 kg/m2    Clinical  Medical Hx: obesity, hypertension, GERD, T2DM, bipolar disorder, anxiety, depression, hyperlipidemia Surgeries: cholecystectomy  Medications: Aripiprazole, Atorvastatin, Valacyclovir, Lisinopril, Asprin, Sertraline, Alprazolam, Furosemide, Insulin Glargine    Allergies: Invokana   Psychosocial/Lifestyle Pt works as a Careers information officer. 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  24-Hr Dietary Recall First Meal: coffee w/ stevia & sugar-free hazelnut creamer + peanut butter crackers Snack: none Second Meal: [employer] salad Snack: none  Third Meal: Taco Bell (or curry chicken, or pork skins/chips)  Snack: none Beverages: Pepsi + coffee + water  Food & Nutrition Related Hx Dietary Hx: Pt states she has recently tried to avoid bread, but will have vegan bread to make sandwiches.  Supplements: prenatal vitamin  Estimated Daily Fluid Intake: 2 L oz Pepsi + 20 oz coffee GI / Other Notable Symptoms: loose stools (especially after consuming high fat meals)   Physical Activity  Current average weekly physical activity: none  Estimated Energy Needs Calories: 1800 Carbohydrate: 200g Protein: 135g Fat: 50g  Pre-Op Goals Reviewed with the Patient . Track food and beverage intake (try MyFitness Pal or the Baritastic app) . Make healthy food choices . Avoid concentrated sugars and fried foods . Keep fat &  sugar in the single digits per serving on food labels . Practice CHEWING your food (aim for applesauce consistency) . Practice not drinking 15 minutes before, during, and 30 minutes after each meal and snack . Avoid all carbonated beverages (ex: soda, sparkling beverages)  . Limit caffeinated beverages (ex: coffee, tea, energy drinks) . Avoid all sugar-sweetened beverages (ex: regular soda, sports drinks)  . Avoid alcohol  . Consume 3 meals per day or try to eat every 3-5 hours . Make a list of non-food related activities . Aim for 64-100 ounces of FLUID daily (with at least half of fluid intake being plain water)  . Aim for at least 60-80 grams of PROTEIN daily . Look for a liquid protein source that contains ?15 g protein and ?5 g carbohydrate (ex: shakes, drinks, shots) . Physical activity is an important part of a healthy lifestyle so keep it moving! The goal is to reach 150 minutes of exercise per week, including cardiovascular and weight baring activity.  *Goals that are bolded indicate the pt would like to start working towards these  Handouts Provided Include  . Pre-Op Goals . Bariatric Surgery Vitamins & Minerals . Bariatric Surgery Protein Shakes  Learning Style & Readiness for Change Teaching method utilized: Visual & Auditory  Demonstrated degree of understanding via: Teach Back  Barriers to learning/adherence to lifestyle change: Contemplative Stage of Change (current barriers: smoking, consumption of soda, lack of physical activity)   RD's Notes for Next Visit . Assess progress made on goals previously set.  . Discuss healthy eating in depth, consider using MyPlate graphic.  . Review carb counting/general healthy eating with type 2 diabetes.   Next Steps Supervised Weight Loss (SWL) Visits Needed: 3  Patient is  to return to [facility] in 1 month for 1st SWL visit.  Patient is to call [facility] to enroll in Pre-Op Class (>2 weeks before surgery) and Post-Op Class (2 weeks after  surgery) for further nutrition education when surgery date is scheduled.

## 2018-07-24 NOTE — Patient Instructions (Signed)
Begin working through the [employer] discussed today, starting with the following:  . Practice CHEWING your food (aim for applesauce consistency) . Physical activity is an important part of a healthy lifestyle so keep it moving! The goal is to reach 150 minutes of exercise per week, including cardiovascular and weight baring activity.  Continue taking multivitamin, and look into taking a vitamin D supplement if you aren't already.

## 2018-07-25 ENCOUNTER — Ambulatory Visit (HOSPITAL_COMMUNITY)
Admission: RE | Admit: 2018-07-25 | Discharge: 2018-07-25 | Disposition: A | Discharge status: Home / Self Care | Payer: Managed Care, Other (non HMO) | Source: Ambulatory Visit | Attending: General Surgery | Admitting: General Surgery

## 2018-08-22 ENCOUNTER — Other Ambulatory Visit: Payer: Self-pay | Admitting: Family

## 2018-08-26 ENCOUNTER — Ambulatory Visit (INDEPENDENT_AMBULATORY_CARE_PROVIDER_SITE_OTHER): Payer: 59 | Admitting: Psychiatry

## 2018-08-26 DIAGNOSIS — F509 Eating disorder, unspecified: Secondary | ICD-10-CM | POA: Diagnosis not present

## 2018-08-28 ENCOUNTER — Encounter: Payer: Self-pay | Admitting: Dietician

## 2018-08-28 ENCOUNTER — Other Ambulatory Visit: Payer: Self-pay | Admitting: Endocrinology

## 2018-08-28 ENCOUNTER — Encounter: Payer: Managed Care, Other (non HMO) | Attending: General Surgery | Admitting: Dietician

## 2018-08-28 VITALS — Wt 249.6 lb

## 2018-08-28 DIAGNOSIS — E669 Obesity, unspecified: Secondary | ICD-10-CM | POA: Diagnosis not present

## 2018-08-28 NOTE — Patient Instructions (Addendum)
Continue working through your Pre-Op Goals sheet, focusing on the following goals this month:   Practice CHEWING your food (aim for applesauce consistency)  Continue to work on not drinking with meals/snacks   Keep up the GREAT work! Cutting back on sodas, coffee, smoking, etc. is hard work. Continue to motivate yourself and give yourself credit for the progress you have made so far.

## 2018-08-28 NOTE — Progress Notes (Signed)
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 Pt states she is still working on quitting smoking.   24-Hr Dietary Recall First Meal: Premier Protein shake + coffee  Snack: pork rinds  Second Meal: vegetables (or salad, or sandwich)  Snack: pork rinds Third Meal: meat (chicken, or pork chops, or ribs)  Snack: none Beverages: water, coffee, protein shake  Food & Nutrition Related Hx Dietary Hx: Pt states she feels as though she has not been "eating right" lately. Pt states she has however cut back on coffee (from 2 cups to 1 cup per day) and sodas (none for 3 weeks, then had 1 last week.)    Estimated Daily Fluid Intake: 20-64 oz Supplements: prenatal vitamin, magnesium, vitamin D GI / Other Notable Symptoms: none stated  Physical Activity  Current average weekly physical activity: none currently (ADLs)  Estimated Energy Needs Calories: 1800 Carbohydrate: 200g Protein: 135g Fat: 50g   NUTRITION DIAGNOSIS  Overweight/obesity ([place]-3.3) related to past poor dietary habits and physical inactivity as evidenced by patient w/ planned Sleeve Gastrectomy surgery following dietary guidelines for continued weight loss.   NUTRITION INTERVENTION  Nutrition counseling (C-1) and education (E-2) to facilitate bariatric surgery goals.  Pre-Op Goals Progress & New Goals . Working on not drinking with  meals  . Cutting back on coffee and soda  . Adding in vegetables  . Working on quitting smoking  . Focusing on chewing food thoroughly, still making progress . No daily/weekly physical activity    Handouts Provided Include   Bariatric Nutrition Visits   Pre Op Goals   MyPlate   Low Carbohydrate Snack Suggestions   Learning Style & Readiness for Change Teaching method utilized: Visual & Auditory  Demonstrated degree of understanding via: Teach Back  Barriers to learning/adherence to lifestyle change: Contemplative Stage of Change (current barriers: smoking, consumption of soda, lack of physical activity)    MONITORING & EVALUATION Dietary intake, weekly physical activity, body weight, and pre-op goals in 1 month.   Next Steps  Patient is to return to [facility] in 1 month for 2nd SWL visit.

## 2018-09-04 ENCOUNTER — Ambulatory Visit (INDEPENDENT_AMBULATORY_CARE_PROVIDER_SITE_OTHER): Payer: 59 | Admitting: Psychiatry

## 2018-09-04 ENCOUNTER — Other Ambulatory Visit: Payer: Self-pay | Admitting: Endocrinology

## 2018-09-04 DIAGNOSIS — F509 Eating disorder, unspecified: Secondary | ICD-10-CM | POA: Diagnosis not present

## 2018-09-09 ENCOUNTER — Other Ambulatory Visit: Payer: Managed Care, Other (non HMO)

## 2018-09-12 ENCOUNTER — Ambulatory Visit: Payer: Managed Care, Other (non HMO) | Admitting: Endocrinology

## 2018-09-25 ENCOUNTER — Encounter: Payer: Managed Care, Other (non HMO) | Attending: General Surgery | Admitting: Dietician

## 2018-09-25 ENCOUNTER — Encounter: Payer: Self-pay | Admitting: Dietician

## 2018-09-25 VITALS — Wt 247.0 lb

## 2018-09-25 DIAGNOSIS — E669 Obesity, unspecified: Secondary | ICD-10-CM | POA: Insufficient documentation

## 2018-09-25 NOTE — Patient Instructions (Signed)
Continue working through your Pre-Op Goals, keep up the great work!!

## 2018-09-25 NOTE — Progress Notes (Signed)
Bariatric Supervised Weight Loss Visit Appt Start Time: 7:40am  End Time: 7:55am  Planned Surgery: Sleeve Gastrectomy   2nd out of 3 SWL Appointments   NUTRITION ASSESSMENT  Anthropometrics  Start weight at [facility]: 245.2 lbs (date: 07/24/2018) Today's weight: 247 lbs Weight change: -2.6 lbs (since previous visit on 08/28/2018) BMI: 38.11 kg/m2    Clinical  Medical Hx: obesity, hypertension, GERD, T2DM, bipolar disorder, anxiety, depression, hyperlipidemia, cholecystectomy  Medications: see list (added magnesium and vitamin D)   Psychosocial/Lifestyle Pt works as a Careers information officer. 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 Pt states she is still working on quitting smoking; she quit for a week then picked it back up again, but plans to quit this week so it is out of her system well before surgery. Pt states her daughter also smokes, so they are working on quitting together.   24-Hr Dietary Recall First Meal: skips (sometimes Premier Protein shake)   Snack: pork skins  Second Meal: Chinese broccoli & chicken (or sub, or salad)  Snack: pork skins Third Meal: meat in the air fryer (chicken, or pork chops, or ribs)  Snack: none Beverages: water, coffee, protein shake, soda  Food & Nutrition Related Hx Dietary Hx: Pt states she feels as though she has not been "eating right" lately. Pt states she has however cut back on coffee (from 2 cups to 1 cup per day) but has incorporated sodas again. Eats 1 bag of pork skins/ day.  Estimated Daily Fluid Intake: 20-64 oz Supplements: prenatal vitamin, magnesium, vitamin D GI / Other Notable Symptoms: none stated  Physical Activity  Current average weekly physical activity: walking 15 mins 2x/day  Estimated Energy Needs Calories: 1800 Carbohydrate: 200g Protein: 135g Fat: 50g   NUTRITION DIAGNOSIS  Overweight/obesity ([place]-3.3) related to past poor dietary habits and physical inactivity as evidenced by patient w/ planned Sleeve  Gastrectomy surgery following dietary guidelines for continued weight loss.   NUTRITION INTERVENTION  Nutrition counseling (C-1) and education (E-2) to facilitate bariatric surgery goals.  Pre-Op Goals Progress & New Goals . Working on not drinking with meals  . Cutting back on coffee  . Drinking soda again   . Adding in vegetables  . Working on quitting smoking  . Focusing on chewing food thoroughly, still making progress . Walking 30 mins/day for physical activity    Handouts Provided Include   Sugary Beverages   Learning Style & Readiness for Change Teaching method utilized: Visual & Auditory  Demonstrated degree of understanding via: Teach Back  Barriers to learning/adherence to lifestyle change: Contemplative Stage of Change (barriers include(d): smoking, consumption of soda, lack of physical activity)    MONITORING & EVALUATION Dietary intake, weekly physical activity, body weight, and pre-op goals in 1 month.   Next Steps  Patient is to return to [facility] in 1 month for 3rd SWL visit.

## 2018-09-26 ENCOUNTER — Encounter: Payer: Managed Care, Other (non HMO) | Admitting: Family

## 2018-10-01 ENCOUNTER — Ambulatory Visit (INDEPENDENT_AMBULATORY_CARE_PROVIDER_SITE_OTHER): Payer: Managed Care, Other (non HMO) | Admitting: Family

## 2018-10-01 ENCOUNTER — Encounter: Payer: Self-pay | Admitting: Family

## 2018-10-01 ENCOUNTER — Other Ambulatory Visit: Payer: Self-pay

## 2018-10-01 VITALS — BP 130/79 | HR 77 | Temp 98.2°F | Resp 16 | Ht 67.5 in | Wt 248.0 lb

## 2018-10-01 DIAGNOSIS — M26609 Unspecified temporomandibular joint disorder, unspecified side: Secondary | ICD-10-CM | POA: Diagnosis not present

## 2018-10-01 DIAGNOSIS — F32A Depression, unspecified: Secondary | ICD-10-CM

## 2018-10-01 DIAGNOSIS — Z0000 Encounter for general adult medical examination without abnormal findings: Secondary | ICD-10-CM | POA: Diagnosis not present

## 2018-10-01 DIAGNOSIS — F329 Major depressive disorder, single episode, unspecified: Secondary | ICD-10-CM

## 2018-10-01 MED ORDER — MELOXICAM 7.5 MG PO TABS: 7.5000 mg | ORAL_TABLET | Freq: Every day | ORAL | 0 refills | Status: DC

## 2018-10-01 NOTE — Patient Instructions (Signed)
Please continue to work on healthy diet, exercise and weight loss. Begin meloxicam for jaw pain.  Schedule follow up with psychiatry.

## 2018-10-01 NOTE — Progress Notes (Signed)
Subjective:    Patient ID: Tanya Wilcox, female    DOB: [birth year], 48 y.o.   MRN: [id number]  HPI  Patient presents today for complete physical.  Immunizations: up to date Diet: needs improvement, seeing nutrition.  Exercise: walks on her breaks at work.  Pap Smear: 5/18- up to date sees Dr. Matthew Saras.  Mammogram: 7/19 Vision:  Up to date. Will do 4/20 Dental:  due  Wt Readings from Last 3 Encounters:  10/01/18 248 lb (112.5 kg)  09/25/18 247 lb (112 kg)  08/28/18 249 lb 9.6 oz (113.2 kg)   Jaw pain- initially on the right, then on the left. Began 2 weeks ago.  Improving but did hurt to chew.  Now like an ache and hurts to open mouth wide.    Review of Systems  Constitutional: Negative for unexpected weight change.  HENT: Negative for rhinorrhea.        Reports some jaw   Respiratory: Negative for cough and shortness of breath.   Cardiovascular: Negative for chest pain.  Gastrointestinal: Negative for blood in stool, constipation and diarrhea.  Genitourinary: Negative for dysuria, frequency and hematuria.  Musculoskeletal: Negative for arthralgias and myalgias.  Skin: Negative for rash.  Neurological: Negative for headaches.  Hematological: Negative for adenopathy.  Psychiatric/Behavioral:       Reports wanting to sleep more.     Past Medical History:  Diagnosis Date  . Alcohol use   . Anxiety   . Bipolar disorder ([place])   . Depression   . Gestational diabetes    borderline II  . Osteoarthritis    knee  . Tobacco abuse      Social History   Socioeconomic History  . Marital status: Single    Spouse name: Not on file  . Number of children: Not on file  . Years of education: Not on file  . Highest education level: Not on file  Occupational History  . Occupation: Sport and exercise psychologist: [employer]  Social Needs  . Financial resource strain: Not on file  . Food insecurity:    Worry: Not on file    Inability: Not on file  . Transportation needs:    Medical:  Not on file    Non-medical: Not on file  Tobacco Use  . Smoking status: Current Every Day Smoker    Types: Cigarettes    Last attempt to quit: 01/20/2013    Years since quitting: 5.6  . Smokeless tobacco: Never Used  . Tobacco comment: 1 pack per day  Substance and Sexual Activity  . Alcohol use: Yes    Alcohol/week: 0.0 standard drinks    Comment: occasionally  . Drug use: No  . Sexual activity: Not on file  Lifestyle  . Physical activity:    Days per week: Not on file    Minutes per session: Not on file  . Stress: Not on file  Relationships  . Social connections:    Talks on phone: Not on file    Gets together: Not on file    Attends religious service: Not on file    Active member of club or organization: Not on file    Attends meetings of clubs or organizations: Not on file    Relationship status: Not on file  . Intimate partner violence:    Fear of current or ex partner: Not on file    Emotionally abused: Not on file    Physically abused: Not on file    Forced  sexual activity: Not on file  Other Topics Concern  . Not on file  Social History Narrative   Lives with daughter   Works at [employer] and [employer]   Enjoys sleeping   1 dog   Completed high school, some college    Past Surgical History:  Procedure Laterality Date  . CHOLECYSTECTOMY  2003    Family History  Problem Relation Age of Onset  . Hypertension Mother   . Hyperlipidemia Mother   . Alcohol abuse Father     Allergies  Allergen Reactions  . Invokana [Canagliflozin] Other (See Comments)    Recurrent Yeast infections    Current Outpatient Medications on File Prior to Visit  Medication Sig Dispense Refill  . ALPRAZolam (XANAX) 0.5 MG tablet Take 0.5 mg by mouth 2 (two) times daily.  1  . ARIPiprazole (ABILIFY) 5 MG tablet Take 1 tablet by mouth daily.  1  . aspirin EC 81 MG tablet Take 81 mg by mouth daily.    Marland Kitchen atorvastatin (LIPITOR) 40 MG tablet Take 1 tablet (40 mg total) by mouth  daily. 90 tablet 1  . furosemide (LASIX) 20 MG tablet TAKE 1 TABLET(20 MG) BY MOUTH DAILY AS NEEDED FOR SWELLING 90 tablet 1  . glucose blood (ONE TOUCH ULTRA TEST) test strip TEST 3 TIMES DAILY BEFORE  MEALS AS DIRECTED 300 each 5  . hydrOXYzine (VISTARIL) 25 MG capsule Take 25 mg by mouth at bedtime as needed for sleep.  1  . Insulin Pen Needle (B-D ULTRAFINE III SHORT PEN) 31G X 8 MM MISC USE DAILY AS DIRECTED 100 each 1  . levonorgestrel (MIRENA) 20 MCG/24HR IUD 1 each by Intrauterine route once.    Marland Kitchen lisinopril (PRINIVIL,ZESTRIL) 20 MG tablet TAKE 1 TABLET BY MOUTH DAILY 90 tablet 1  . magnesium citrate SOLN Take 1 Bottle by mouth once.    . Multiple Vitamin (MULTIVITAMIN) tablet Take 1 tablet by mouth daily.    . nicotine (NICODERM CQ - DOSED IN MG/24 HOURS) 21 mg/24hr patch APPLY 1 PATCH ONTO SKIN  DAILY 42 patch 1  . sertraline (ZOLOFT) 25 MG tablet Take 25 mg by mouth daily.  1  . TOUJEO MAX SOLOSTAR 300 UNIT/ML SOPN INJECT 70 UNITS INTO SKIN DAILY 12 mL 3  . TRULICITY 1.5 JE/5.6DJ SOPN INJECT 1 PEN INTO THE SKIN WEEKLY 6 mL 0  . valACYclovir (VALTREX) 500 MG tablet TAKE 1 TABLET BY MOUTH  EVERY DAY 90 tablet 1  . VITAMIN D, CHOLECALCIFEROL, PO Take by mouth.    . metFORMIN (GLUCOPHAGE-XR) 500 MG 24 hr tablet Take 3 tablets (1,500 mg total) by mouth daily with supper. (Patient not taking: Reported on 10/01/2018) 90 tablet 3   No current facility-administered medications on file prior to visit.     BP 130/79 (BP Location: Right Arm, Patient Position: Sitting, Cuff Size: Large)   Pulse 77   Temp 98.2 F (36.8 C) (Oral)   Resp 16   Ht 5' 7.5" (1.715 m)   Wt 248 lb (112.5 kg)   SpO2 100%   BMI 38.27 kg/m       Objective:   Physical Exam Physical Exam  Constitutional: She is oriented to person, place, and time. She appears well-developed and well-nourished. No distress.  HENT:  Head: Normocephalic and atraumatic.  Right Ear: Tympanic membrane and ear canal normal.  Left  Ear: Tympanic membrane and ear canal normal.  Mouth/Throat: Oropharynx is clear and moist.  Eyes: Pupils are equal, round, and  reactive to light. No scleral icterus.  Neck: Normal range of motion. No thyromegaly present.  Cardiovascular: Normal rate and regular rhythm.   No murmur heard. Pulmonary/Chest: Effort normal and breath sounds normal. No respiratory distress. He has no wheezes. She has no rales. She exhibits no tenderness.  Abdominal: Soft. Bowel sounds are normal. She exhibits no distension and no mass. There is no tenderness. There is no rebound and no guarding.  Musculoskeletal: She exhibits no edema.  Lymphadenopathy:    She has no cervical adenopathy.  Neurological: She is alert and oriented to person, place, and time. She has normal patellar reflexes. She exhibits normal muscle tone. Coordination normal.  Skin: Skin is warm and dry.  Psychiatric: She has flat mood and affect. Her behavior is normal. Judgment and thought content normal.  Breast/pelvic: deferred           Assessment & Plan:   Preventive care- discussed healthy diet, exercise, and weight loss.  Immunizations reviewed and up-to-date.  Discussed importance of routine dental care.  Depression-I said suspect that her fatigue is related to uncontrolled depression.  I have advised her to follow back up with her psychiatrist.  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       Assessment & Plan:

## 2018-10-02 LAB — BASIC METABOLIC PANEL WITH GFR
BUN: 14 mg/dL (ref 7–25)
CHLORIDE: 107 mmol/L (ref 98–110)
CO2: 24 mmol/L (ref 20–32)
Calcium: 9.3 mg/dL (ref 8.6–10.2)
Creat: 0.83 mg/dL (ref 0.50–1.10)
GFR, Est African American: 97 mL/min/{1.73_m2} (ref >=60)
GFR, Est Non African American: 84 mL/min/{1.73_m2} (ref >=60)
Glucose, Bld: 108 mg/dL — ABNORMAL HIGH (ref 65–99)
Potassium: 4.3 mmol/L (ref 3.5–5.3)
Sodium: 140 mmol/L (ref 135–146)

## 2018-10-02 LAB — CBC WITH DIFFERENTIAL/PLATELET
Absolute Monocytes: 592 cells/uL (ref 200–950)
BASOS PCT: 0.6 %
Basophils Absolute: 41 cells/uL (ref 0–200)
Eosinophils Absolute: 88 cells/uL (ref 15–500)
Eosinophils Relative: 1.3 %
HCT: 38 % (ref 35.0–45.0)
Hemoglobin: 12.9 g/dL (ref 11.7–15.5)
Lymphs Abs: 2108 cells/uL (ref 850–3900)
MCH: 31.6 pg (ref 27.0–33.0)
MCHC: 33.9 g/dL (ref 32.0–36.0)
MCV: 93.1 fL (ref 80.0–100.0)
MPV: 9.6 fL (ref 7.5–12.5)
Monocytes Relative: 8.7 %
Neutro Abs: 3971 cells/uL (ref 1500–7800)
Neutrophils Relative %: 58.4 %
Platelets: 439 10*3/uL — ABNORMAL HIGH (ref 140–400)
RBC: 4.08 10*6/uL (ref 3.80–5.10)
RDW: 11.7 % (ref 11.0–15.0)
Total Lymphocyte: 31 %
WBC: 6.8 10*3/uL (ref 3.8–10.8)

## 2018-10-02 LAB — LIPID PANEL
Cholesterol: 136 mg/dL (ref <200)
HDL: 37 mg/dL — ABNORMAL LOW (ref >=50)
LDL Cholesterol (Calc): 80 mg/dL (calc)
Non-HDL Cholesterol (Calc): 99 mg/dL (calc) (ref <130)
Total CHOL/HDL Ratio: 3.7 (calc) (ref <5.0)
Triglycerides: 107 mg/dL (ref <150)

## 2018-10-02 LAB — HEPATIC FUNCTION PANEL
AG Ratio: 1.4 (calc) (ref 1.0–2.5)
ALKALINE PHOSPHATASE (APISO): 59 U/L (ref 31–125)
ALT: 15 U/L (ref 6–29)
AST: 12 U/L (ref 10–35)
Albumin: 3.9 g/dL (ref 3.6–5.1)
Bilirubin, Direct: 0.1 mg/dL (ref 0.0–0.2)
Globulin: 2.7 g/dL (calc) (ref 1.9–3.7)
Indirect Bilirubin: 0.2 mg/dL (calc) (ref 0.2–1.2)
Total Bilirubin: 0.3 mg/dL (ref 0.2–1.2)
Total Protein: 6.6 g/dL (ref 6.1–8.1)

## 2018-10-02 LAB — TSH: TSH: 1.31 m[IU]/L

## 2018-10-07 ENCOUNTER — Encounter: Payer: Self-pay | Admitting: Family

## 2018-10-19 ENCOUNTER — Encounter: Payer: Self-pay | Admitting: Family

## 2018-10-22 ENCOUNTER — Other Ambulatory Visit: Payer: Self-pay

## 2018-10-22 ENCOUNTER — Ambulatory Visit: Payer: Managed Care, Other (non HMO) | Admitting: Endocrinology

## 2018-10-22 ENCOUNTER — Encounter: Payer: Self-pay | Admitting: Endocrinology

## 2018-10-22 ENCOUNTER — Other Ambulatory Visit: Payer: Self-pay | Admitting: Endocrinology

## 2018-10-22 VITALS — BP 122/78 | HR 100 | Ht 67.5 in | Wt 250.0 lb

## 2018-10-22 DIAGNOSIS — Z794 Long term (current) use of insulin: Secondary | ICD-10-CM

## 2018-10-22 DIAGNOSIS — E1165 Type 2 diabetes mellitus with hyperglycemia: Secondary | ICD-10-CM | POA: Diagnosis not present

## 2018-10-22 LAB — POCT GLYCOSYLATED HEMOGLOBIN (HGB A1C): Hemoglobin A1C: 7.3 % — AB (ref 4.0–5.6)

## 2018-10-22 LAB — GLUCOSE, POCT (MANUAL RESULT ENTRY): POC Glucose: 203 mg/dl — AB (ref 70–99)

## 2018-10-22 MED ORDER — CANAGLIFLOZIN 100 MG PO TABS: ORAL_TABLET | ORAL | 3 refills | Status: DC

## 2018-10-22 MED ORDER — GLUCOSE BLOOD VI STRP: ORAL_STRIP | 0 refills | Status: DC

## 2018-10-22 MED ORDER — FLUCONAZOLE 150 MG PO TABS: 150.0000 mg | ORAL_TABLET | Freq: Once | ORAL | 0 refills | Status: DC

## 2018-10-22 NOTE — Progress Notes (Signed)
Patient ID: Tanya Wilcox, female   DOB: [birth year], 48 y.o.   MRN: [id number]          Reason for Appointment: Follow-up for Type 2 Diabetes  Referring PCP: Debbrah Alar   History of Present Illness:          Date of diagnosis of type 2 diabetes mellitus: 2007       Background history:   She does not remember the initial symptoms at diagnosis and level of blood sugar and no previous records available Her A1c was 7% in 2014 and as high as 9.9 in 2015 She has previously taken metformin, Amaryl, Invokana, Actos and Januvia with variable level of control Lowest A1c was 6.4 in 2017 She was started in  7/18 on basal insulin using Basaglar presumably for worsening blood sugar control  Recent history:   INSULIN regimen is: 0- 78 units of TOUJEO insulin, in am  Non-insulin hypoglycemic drugs the patient is taking are: Trulicity 1.5 mg weekly, metformin ER 1500 mg daily  Current management, blood sugar patterns and problems identified:  She had done overall much better with her compliance with diet and level of control after seeing the dietitian late last year  However she appears to have had issues with depression and because of her routine changing with working from home she has not complied with diet, exercise regimen, glucose monitoring or even taking insulin  Blood sugars have been checked sporadically  She has only 3 readings in the last month with glucose over 200 yesterday but before that was 147; also glucose in the office today is over 200  She was told to try at least 500 mg of metformin but she did not do so  She does take Trulicity without side effects  Has gained a couple pounds recently She said that she cannot tolerate metformin and stopped it altogether       Side effects from medications have been: Nausea from 2000 mg of metformin, recurrent yeast infections with Invokana  Compliance with the medical regimen:  Hypoglycemia:    Glucose monitoring:  done  less than 1 times a day         Glucometer: One Touch.       Blood Glucose readings as above  Previous average blood sugar 132 at home   Self-care: The diet that the patient has been following is: tries to limit carbs.      Meal times are:  Breakfast is at 6 AM lunch: 1 PM dinner: 4:30 PM Typical meal intake: Breakfast is eggs or chicken wings.  Lunch is usually a salad and evening meal is chicken wings.  May have snacks with grapes, candy and pickles               Dietician visit, most recent: 03/2018               Exercise:  walking at times up to 15 minutes, less recently   Weight history:  Wt Readings from Last 3 Encounters:  10/22/18 250 lb (113.4 kg)  10/01/18 248 lb (112.5 kg)  09/25/18 247 lb (112 kg)    Glycemic control:   Lab Results  Component Value Date   HGBA1C 7.3 (A) 10/22/2018   HGBA1C 6.7 (H) 06/02/2018   HGBA1C 7.2 (H) 02/28/2018   Lab Results  Component Value Date   MICROALBUR <0.7 11/19/2016   LDLCALC 80 10/01/2018   CREATININE 0.83 10/01/2018   Lab Results  Component Value Date  MICRALBCREAT 1.1 11/19/2016    No results found for: FRUCTOSAMINE    Allergies as of 10/22/2018      Reactions   Invokana [canagliflozin] Other (See Comments)   Recurrent Yeast infections      Medication List       Accurate as of October 22, 2018  1:38 PM. Always use your most recent med list.        ALPRAZolam 0.5 MG tablet Commonly known as:  XANAX Take 0.5 mg by mouth 2 (two) times daily.   ARIPiprazole 5 MG tablet Commonly known as:  ABILIFY Take 1 tablet by mouth daily.   aspirin EC 81 MG tablet Take 81 mg by mouth daily.   atorvastatin 40 MG tablet Commonly known as:  LIPITOR Take 1 tablet (40 mg total) by mouth daily.   canagliflozin 100 MG Tabs tablet Commonly known as:  Invokana 1 tablet before breakfast   furosemide 20 MG tablet Commonly known as:  LASIX TAKE 1 TABLET(20 MG) BY MOUTH DAILY AS NEEDED FOR SWELLING   glucose blood test  strip Commonly known as:  ONE TOUCH ULTRA TEST USE TO TEST THREE TIMES DAILY BEFORE MEALS AS DIRECTED   hydrOXYzine 25 MG capsule Commonly known as:  VISTARIL Take 25 mg by mouth at bedtime as needed for sleep.   Insulin Pen Needle 31G X 8 MM Misc Commonly known as:  B-D ULTRAFINE III SHORT PEN USE DAILY AS DIRECTED   levonorgestrel 20 MCG/24HR IUD Commonly known as:  MIRENA 1 each by Intrauterine route once.   lisinopril 20 MG tablet Commonly known as:  PRINIVIL,ZESTRIL TAKE 1 TABLET BY MOUTH DAILY   magnesium citrate Soln Take 1 Bottle by mouth once.   meloxicam 7.5 MG tablet Commonly known as:  MOBIC Take 1 tablet (7.5 mg total) by mouth daily.   metFORMIN 500 MG 24 hr tablet Commonly known as:  GLUCOPHAGE-XR Take 3 tablets (1,500 mg total) by mouth daily with supper.   multivitamin tablet Take 1 tablet by mouth daily.   nicotine 21 mg/24hr patch Commonly known as:  NICODERM CQ - dosed in mg/24 hours APPLY 1 PATCH ONTO SKIN  DAILY   sertraline 25 MG tablet Commonly known as:  ZOLOFT Take 25 mg by mouth daily.   Toujeo Max SoloStar 300 UNIT/ML Sopn Generic drug:  Insulin Glargine (2 Unit Dial) INJECT 70 UNITS INTO SKIN DAILY   Trulicity 1.5 JH/4.1DE Sopn Generic drug:  Dulaglutide INJECT 1 PEN INTO THE SKIN WEEKLY   valACYclovir 500 MG tablet Commonly known as:  VALTREX TAKE 1 TABLET BY MOUTH  EVERY DAY   VITAMIN D (CHOLECALCIFEROL) PO Take by mouth.       Allergies:  Allergies  Allergen Reactions  . Invokana [Canagliflozin] Other (See Comments)    Recurrent Yeast infections    Past Medical History:  Diagnosis Date  . Alcohol use   . Anxiety   . Bipolar disorder ([place])   . Depression   . Gestational diabetes    borderline II  . Osteoarthritis    knee  . Tobacco abuse     Past Surgical History:  Procedure Laterality Date  . CHOLECYSTECTOMY  2003    Family History  Problem Relation Age of Onset  . Hypertension Mother   .  Hyperlipidemia Mother   . Alcohol abuse Father     Social History:  reports that she has been smoking cigarettes. She has never used smokeless tobacco. She reports current alcohol use. She reports that she  does not use drugs.   Review of Systems   Lipid history: On treatment by PCP with Lipitor 40 mg    Lab Results  Component Value Date   CHOL 136 10/01/2018   HDL 37 (L) 10/01/2018   LDLCALC 80 10/01/2018   TRIG 107 10/01/2018   CHOLHDL 3.7 10/01/2018           Hypertension: Treated with lisinopril 20 mg by PCP  BP Readings from Last 3 Encounters:  10/22/18 122/78  10/01/18 130/79  06/27/18 140/78    Most recent eye exam was in 09/2017, no retinopathy  Most recent foot exam: 10/2017    LABS:  Office Visit on 10/22/2018  Component Date Value Ref Range Status  . Hemoglobin A1C 10/22/2018 7.3* 4.0 - 5.6 % Final  . POC Glucose 10/22/2018 203* 70 - 99 mg/dl Final    Physical Examination:  BP 122/78 (BP Location: Left Arm, Patient Position: Sitting, Cuff Size: Large)   Pulse 100   Ht 5' 7.5" (1.715 m)   Wt 250 lb (113.4 kg)   SpO2 97%   BMI 38.58 kg/m          ASSESSMENT:  Diabetes type 2, BMI 38  See history of present illness for detailed discussion of current diabetes management, blood sugar patterns and problems identified   Her A1c is 7.3 and increasing  Although she had done well previously with improving her diet she recently has not been able to follow instructions for taking care of her diabetes including taking insulin Blood sugars recently are over 200 Also with inconsistent dose of insulin she is consistently hyperglycemic although most blood sugars are in the low 200 range A1c is starting to go up She is going to be treated for depression soon and this may improve her compliance  Also discussed benefits of Invokana which she had tried before and apparently was only given 300 mg and not 100    PLAN:   Restart exercise with walking at  least 15 minutes daily Trial of 100 mg Invokana Discussed how to prevent yeast infections and she can keep Diflucan on hand Given patient information brochure on this and co-pay card  Restart checking blood sugars, following the diet Discussed checking blood sugars postprandially in addition to some fasting readings and discussed blood sugar targets Stressed importance of weight loss with diabetes control Also will need to review her blood sugar patterns on the next visit to make sure she does not have postprandial hyperglycemia with using basal insulin alone  Discussed starting insulin at 50 units and titrating 5 units every 5 days up or down based on morning blood sugar She thinks she can do this in the morning although discussed that she can do it in the evening if it is more convenient but would do it at the same time every day She will call if she has any difficulties with above regimen Start following instructions from the dietitian again  Follow-up in 6 weeks  Patient Instructions  Check blood sugars on waking up days a week  Also check blood sugars about 2 hours after meals and do this after different meals by rotation  Recommended blood sugar levels on waking up are 90-130 and about 2 hours after meal is 130-160  Please bring your blood sugar monitor to each visit, thank you  Walk daily  Insulin 50 units and go up 5 units every 5 days until morning sugars are below 120    Counseling time on  subjects discussed in assessment and plan sections is over 50% of today's 25 minute visit    Elayne Snare 10/22/2018, 1:38 PM   Note: This office note was prepared with Dragon voice recognition system technology. Any transcriptional errors that result from this process are unintentional.

## 2018-10-22 NOTE — Patient Instructions (Signed)
Check blood sugars on waking up days a week  Also check blood sugars about 2 hours after meals and do this after different meals by rotation  Recommended blood sugar levels on waking up are 90-130 and about 2 hours after meal is 130-160  Please bring your blood sugar monitor to each visit, thank you  Walk daily  Insulin 50 units and go up 5 units every 5 days until morning sugars are below 120

## 2018-10-23 ENCOUNTER — Ambulatory Visit: Payer: Self-pay | Admitting: Skilled Nursing Facility1

## 2018-10-23 ENCOUNTER — Encounter: Payer: Self-pay | Admitting: Family

## 2018-10-23 NOTE — Telephone Encounter (Signed)
Spoke with pt and scheduled virtual visit for 10/24/18 at 1pm. App downloaded and email verified.

## 2018-10-24 ENCOUNTER — Other Ambulatory Visit: Payer: Self-pay

## 2018-10-24 ENCOUNTER — Ambulatory Visit (INDEPENDENT_AMBULATORY_CARE_PROVIDER_SITE_OTHER): Payer: Managed Care, Other (non HMO) | Admitting: Family

## 2018-10-24 DIAGNOSIS — F419 Anxiety disorder, unspecified: Secondary | ICD-10-CM

## 2018-10-24 DIAGNOSIS — F329 Major depressive disorder, single episode, unspecified: Secondary | ICD-10-CM | POA: Diagnosis not present

## 2018-10-24 NOTE — Progress Notes (Signed)
Virtual Visit via Video Note  I connected with@ on 10/24/18 at  1:00 PM EDT by a video enabled telemedicine application and verified that I am speaking with the correct person using two identifiers. This visit type was conducted due to national recommendations for restrictions regarding the COVID-19 Pandemic (e.g. social distancing).  This format is felt to be most appropriate for this patient at this time.   I discussed the limitations of evaluation and management by telemedicine and the availability of in person appointments. The patient expressed understanding and agreed to proceed.  Only the patient and myself were on today's video visit. The patient was at home and I was in my office at the time of today's visit.   History of Present Illness:  Patient is a 48 yr old female who presents today with chief complaint of depression.  She reports that she can't stop crying, doesn't want to be near anybody, feels like everything is too much.  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 She has an appointment with her psychiatrist this afternoon. Notes that she ran out of her abilify 3 weeks ago and her depression symptoms worsened at that time. She continues zoloft. She denies SI/HI. Notes that she often has negative thoughts about herself such as that "I am a bad person, I make bad choices, I can't do anything right."   Was seeing a therapist but they did not take her insurance so she was unable to follow back up with them.   Observations/Objective:   Office Visit from 10/24/2018 in [facility]  PHQ-9 Total Score  23     GAD 7 : Generalized Anxiety Score 10/24/2018  Nervous, Anxious, on Edge 1  Control/stop worrying 3  Worry too much - different things 3  Trouble relaxing 3  Restless 3  Easily annoyed or irritable 2  Afraid - awful might happen 3  Total GAD 7 Score 18  Anxiety Difficulty  Somewhat difficult     Gen: Awake, alert, no acute distress Resp: Breathing is even and non-labored Psych: tearful but calm Neuro: Alert and Oriented x 3, + facial symmetry, speech is clear.   Assessment and Plan: Depression/Anxiety (hx of bipolar disorder)- uncontrolled. It sounds like her symptoms have likely worsened due to running out of her abilify 3 weeks ago. I advised the patient to follow up with her psychiatrist as scheduled for med refills/adjustment.  I did recommend that she continue to work on finding a Social worker who takes her insurance. I have sent a message to the counselor at our office to see if she can work her in.    A total of 15  minutes were spent counseling patient on depression/anxiety.   Follow Up Instructions:    I discussed the assessment and treatment plan with the patient. The patient was provided an opportunity to ask questions and all were answered. The patient agreed with the plan and demonstrated an understanding of the instructions.   The patient was advised to call back or seek an in-person evaluation if the symptoms worsen or if the condition fails to improve as anticipated.    Nance Pear, NP

## 2018-10-30 ENCOUNTER — Ambulatory Visit: Payer: Self-pay | Admitting: Skilled Nursing Facility1

## 2018-11-09 ENCOUNTER — Encounter: Payer: Self-pay | Admitting: Family

## 2018-11-10 MED ORDER — VALACYCLOVIR HCL 500 MG PO TABS: 500.0000 mg | ORAL_TABLET | Freq: Every day | ORAL | 1 refills | Status: DC

## 2018-11-18 ENCOUNTER — Ambulatory Visit: Payer: 59 | Admitting: Psychiatry

## 2018-11-25 ENCOUNTER — Other Ambulatory Visit: Payer: Self-pay | Admitting: Endocrinology

## 2018-12-03 ENCOUNTER — Ambulatory Visit: Payer: Managed Care, Other (non HMO) | Admitting: Endocrinology

## 2018-12-03 ENCOUNTER — Encounter: Payer: Self-pay | Admitting: Endocrinology

## 2018-12-03 ENCOUNTER — Other Ambulatory Visit: Payer: Self-pay

## 2018-12-03 VITALS — BP 140/78 | HR 75 | Ht 67.5 in | Wt 244.0 lb

## 2018-12-03 DIAGNOSIS — Z794 Long term (current) use of insulin: Secondary | ICD-10-CM | POA: Diagnosis not present

## 2018-12-03 DIAGNOSIS — I10 Essential (primary) hypertension: Secondary | ICD-10-CM

## 2018-12-03 DIAGNOSIS — E1165 Type 2 diabetes mellitus with hyperglycemia: Secondary | ICD-10-CM

## 2018-12-03 LAB — COMPREHENSIVE METABOLIC PANEL
ALT: 21 U/L (ref 0–35)
AST: 18 U/L (ref 0–37)
Albumin: 4.4 g/dL (ref 3.5–5.2)
Alkaline Phosphatase: 73 U/L (ref 39–117)
BUN: 12 mg/dL (ref 6–23)
CO2: 27 mEq/L (ref 19–32)
Calcium: 10 mg/dL (ref 8.4–10.5)
Chloride: 101 mEq/L (ref 96–112)
Creatinine, Ser: 0.85 mg/dL (ref 0.40–1.20)
GFR: 86.48 mL/min (ref >60.00)
Glucose, Bld: 109 mg/dL — ABNORMAL HIGH (ref 70–99)
Potassium: 4.3 mEq/L (ref 3.5–5.1)
Sodium: 137 mEq/L (ref 135–145)
Total Bilirubin: 0.4 mg/dL (ref 0.2–1.2)
Total Protein: 7.6 g/dL (ref 6.0–8.3)

## 2018-12-03 LAB — URINALYSIS, ROUTINE W REFLEX MICROSCOPIC
Bilirubin Urine: NEGATIVE
Hgb urine dipstick: NEGATIVE
Ketones, ur: NEGATIVE
Leukocytes,Ua: NEGATIVE
Nitrite: NEGATIVE
RBC / HPF: NONE SEEN (ref 0–?)
Specific Gravity, Urine: 1.02 (ref 1.000–1.030)
Total Protein, Urine: NEGATIVE
Urine Glucose: >=1000 — AB
Urobilinogen, UA: 0.2 (ref 0.0–1.0)
pH: 5.5 (ref 5.0–8.0)

## 2018-12-03 LAB — MICROALBUMIN / CREATININE URINE RATIO
Creatinine,U: 56.9 mg/dL
Microalb Creat Ratio: 1.2 mg/g (ref 0.0–30.0)
Microalb, Ur: <0.7 mg/dL (ref 0.0–1.9)

## 2018-12-03 NOTE — Patient Instructions (Addendum)
Check blood sugars on waking up days a week  Also check blood sugars about 2 hours after meals and do this after different meals by rotation  Recommended blood sugar levels on waking up are 90-130 and about 2 hours after meal is 130-160  Please bring your blood sugar monitor to each visit, thank you  Start OZEMPIC injections by dialing 0.25 mg on the pen as shown once weekly on the same day of the week.   You may inject in the sides of the stomach, outer thigh or arm as indicated in the brochure given. If you have any difficulties using the pen see the video at CompPlans.[email or web address]  You will feel fullness of the stomach with starting the medication and should try to keep the portions at meals small.  You may experience nausea in the first few days which usually gets better over time    After 2 weeks increase the dose to 0.5 mg weekly  If you have any questions or persistent side effects please call the office   You may also talk to a nurse educator with [employer] at [phone] Useful website: [email or web address]   Toujeo 84 and adjust based on am sugars

## 2018-12-03 NOTE — Progress Notes (Signed)
Patient ID: Tanya Wilcox, female   DOB: 1970-10-21, 48 y.o.   MRN: [id number]          Reason for Appointment: Follow-up for Type 2 Diabetes  Referring PCP: Debbrah Alar   History of Present Illness:          Date of diagnosis of type 2 diabetes mellitus: 2007       Background history:   She does not remember the initial symptoms at diagnosis and level of blood sugar and no previous records available Her A1c was 7% in 2014 and as high as 9.9 in 2015 She has previously taken metformin, Amaryl, Invokana, Actos and Januvia with variable level of control Lowest A1c was 6.4 in 2017 She was started in  7/18 on basal insulin using Basaglar presumably for worsening blood sugar control  Recent history:   INSULIN regimen is: 78 units of TOUJEO insulin, in am  Non-insulin hypoglycemic drugs the patient is taking are: Trulicity 1.5 mg weekly, Invokana 100 mg daily  Current management, blood sugar patterns and problems identified:  On her follow-up in 4/20 she had not been paying attention to self-care measures to improve her diabetes control and also taking insulin irregularly  Because of her tendency to weight gain and poor control she was started on a trial of Invokana 100 mg daily, previously had excessive yeast infections with 300 mg  She has done overall better with taking her insulin daily, tolerating Invokana with only 1 episode of yeast infection 2 weeks after the medication was started  Her weight has gone down  However her blood sugars are still uncontrolled except on a couple of days last month  Again this is from her not being able to motivate herself to watch her diet or start any exercise; she is not watching her portions or snacks  She does not like to walk outside  Also forgets to check her sugars AFTER dinner        Side effects from medications have been: Nausea from 2000 mg of metformin, recurrent yeast infections with Invokana  Compliance with the medical  regimen:  Hypoglycemia:    Glucose monitoring:  done less than 1 times a day         Glucometer: One Touch.       Blood Glucose readings from monitor download:   PRE-MEAL Fasting Lunch Dinner Bedtime Overall  Glucose range:  122-239   82-263    Mean/median:  197  158  189   177   POST-MEAL PC Breakfast PC Lunch PC Dinner  Glucose range:     Mean/median:                    Dietician visit, most recent: 03/2018  Weight history:  Wt Readings from Last 3 Encounters:  12/03/18 244 lb (110.7 kg)  10/22/18 250 lb (113.4 kg)  10/01/18 248 lb (112.5 kg)    Glycemic control:   Lab Results  Component Value Date   HGBA1C 7.3 (A) 10/22/2018   HGBA1C 6.7 (H) 06/02/2018   HGBA1C 7.2 (H) 02/28/2018   Lab Results  Component Value Date   MICROALBUR <0.7 11/19/2016   LDLCALC 80 10/01/2018   CREATININE 0.83 10/01/2018   Lab Results  Component Value Date   MICRALBCREAT 1.1 11/19/2016    No results found for: FRUCTOSAMINE    Allergies as of 12/03/2018   No Active Allergies     Medication List       Accurate as  of Dec 03, 2018  2:06 PM. If you have any questions, ask your nurse or doctor.        STOP taking these medications   hydrOXYzine 25 MG capsule Commonly known as:  VISTARIL Stopped by:  Elayne Snare, MD   meloxicam 7.5 MG tablet Commonly known as:  MOBIC Stopped by:  Elayne Snare, MD   metFORMIN 500 MG 24 hr tablet Commonly known as:  GLUCOPHAGE-XR Stopped by:  Elayne Snare, MD   nicotine 21 mg/24hr patch Commonly known as:  NICODERM CQ - dosed in mg/24 hours Stopped by:  Elayne Snare, MD     TAKE these medications   ALPRAZolam 0.5 MG tablet Commonly known as:  XANAX Take 0.5 mg by mouth 2 (two) times daily as needed.   ARIPiprazole 5 MG tablet Commonly known as:  ABILIFY Take 1 tablet by mouth daily.   aspirin EC 81 MG tablet Take 81 mg by mouth daily.   atorvastatin 40 MG tablet Commonly known as:  LIPITOR Take 1 tablet (40 mg total) by mouth  daily.   canagliflozin 100 MG Tabs tablet Commonly known as:  Invokana 1 tablet before breakfast   furosemide 20 MG tablet Commonly known as:  LASIX TAKE 1 TABLET(20 MG) BY MOUTH DAILY AS NEEDED FOR SWELLING   glucose blood test strip Commonly known as:  ONE TOUCH ULTRA TEST USE TO TEST THREE TIMES DAILY BEFORE MEALS AS DIRECTED   Insulin Pen Needle 31G X 8 MM Misc Commonly known as:  B-D ULTRAFINE III SHORT PEN USE DAILY AS DIRECTED   levonorgestrel 20 MCG/24HR IUD Commonly known as:  MIRENA 1 each by Intrauterine route once.   lisinopril 20 MG tablet Commonly known as:  ZESTRIL TAKE 1 TABLET BY MOUTH DAILY   magnesium citrate Soln Take 1 Bottle by mouth daily.   multivitamin tablet Take 1 tablet by mouth daily.   sertraline 25 MG tablet Commonly known as:  ZOLOFT Take 25 mg by mouth daily.   Toujeo Max SoloStar 300 UNIT/ML Sopn Generic drug:  Insulin Glargine (2 Unit Dial) Inject 78 Units into the skin daily. Inject 78 units under the skin once daily. What changed:  Another medication with the same name was removed. Continue taking this medication, and follow the directions you see here. Changed by:  Elayne Snare, MD   Trulicity 1.5 QA/8.3MH Sopn Generic drug:  Dulaglutide INJECT 1 PEN INTO THE SKIN WEEKLY   valACYclovir 500 MG tablet Commonly known as:  VALTREX Take 1 tablet (500 mg total) by mouth daily.   VITAMIN D (CHOLECALCIFEROL) PO Take by mouth.       Allergies:  No Active Allergies  Past Medical History:  Diagnosis Date  . Alcohol use   . Anxiety   . Bipolar disorder ([place])   . Depression   . Gestational diabetes    borderline II  . Osteoarthritis    knee  . Tobacco abuse     Past Surgical History:  Procedure Laterality Date  . CHOLECYSTECTOMY  2003    Family History  Problem Relation Age of Onset  . Hypertension Mother   . Hyperlipidemia Mother   . Alcohol abuse Father     Social History:  reports that she has been smoking  cigarettes. She has never used smokeless tobacco. She reports current alcohol use. She reports that she does not use drugs.   Review of Systems   Lipid history: On adequate treatment by PCP with Lipitor 40 mg  Lab Results  Component Value Date   CHOL 136 10/01/2018   HDL 37 (L) 10/01/2018   LDLCALC 80 10/01/2018   TRIG 107 10/01/2018   CHOLHDL 3.7 10/01/2018           Hypertension: Treated with lisinopril 20 mg by PCP  BP Readings from Last 3 Encounters:  12/03/18 140/78  10/22/18 122/78  10/01/18 130/79    Most recent eye exam was in 09/2017, no retinopathy  Most recent foot exam: 10/2017    LABS:  No visits with results within 1 Week(s) from this visit.  Latest known visit with results is:  Office Visit on 10/22/2018  Component Date Value Ref Range Status  . Hemoglobin A1C 10/22/2018 7.3* 4.0 - 5.6 % Final  . POC Glucose 10/22/2018 203* 70 - 99 mg/dl Final    Physical Examination:  BP 140/78 (BP Location: Left Arm, Patient Position: Sitting, Cuff Size: Large)   Pulse 75   Ht 5' 7.5" (1.715 m)   Wt 244 lb (110.7 kg)   SpO2 99%   BMI 37.65 kg/m          ASSESSMENT:  Diabetes type 2, BMI 38  See history of present illness for detailed discussion of current diabetes management, blood sugar patterns and problems identified   Her A1c is 7.3 and not consistently controlled  Although she has lost weight with starting Invokana she still does not have adequate control with blood sugars averaging over 180 at home This is mostly fasting blood sugars She has also not been able to reduce her insulin with adding Invokana Previously was on metformin which she could not continue because of side effect  Also not completely benefiting from taking Trulicity Not clear if she is having any effects on insulin sensitivity with taking Abilify  She does need more motivation, regular exercise and better diet Also needs to check sugars after evening meal which she is not  doing So far tolerating Invokana with only 1 episode of vaginal candidiasis that was treated with Diflucan  HYPERTENSION: Well controlled and has not needed adjustment of her lisinopril with starting Invokana    PLAN:   Restart exercise and she agrees to do an exercise video at home She thinks she can join weight watchers and try to be more structured with her diet However she needs to make sure she has protein at each meal She will need to increase her insulin to get morning sugars consistently controlled not to go up 6 units for now Subsequently needs adjustments in increments of 4 units until morning sugars are below 130 Also if sugars are improving she can start backing down on her insulin  She will be given a trial of Ozempic when she finishes her Trulicity and the Ozempic device was shown to her Discussed how to titrate this and she can start with 0.25 in the first 2 weeks and then 0.5 weekly, potentially may need 1 mg  She will call for prescription management she finishes her Trulicity Also may consider using Tresiba instead of Toujeo Discussed that if she is having consistently high readings after supper will need to modify her treatment again  Check fructosamine to establish level of control today  Follow-up in 8 weeks with A1c  Counseling time on subjects discussed in assessment and plan sections is over 50% of today's 25 minute visit   Patient Instructions  Check blood sugars on waking up days a week  Also check blood sugars about 2 hours after  meals and do this after different meals by rotation  Recommended blood sugar levels on waking up are 90-130 and about 2 hours after meal is 130-160  Please bring your blood sugar monitor to each visit, thank you  Start OZEMPIC injections by dialing 0.25 mg on the pen as shown once weekly on the same day of the week.   You may inject in the sides of the stomach, outer thigh or arm as indicated in the brochure given. If you  have any difficulties using the pen see the video at CompPlans.[email or web address]  You will feel fullness of the stomach with starting the medication and should try to keep the portions at meals small.  You may experience nausea in the first few days which usually gets better over time    After 2 weeks increase the dose to 0.5 mg weekly  If you have any questions or persistent side effects please call the office   You may also talk to a nurse educator with [employer] at [phone] Useful website: [email or web address]   Toujeo 84 and adjust based on am sugars          Elayne Snare 12/03/2018, 2:06 PM   Note: This office note was prepared with Dragon voice recognition system technology. Any transcriptional errors that result from this process are unintentional.

## 2018-12-04 LAB — FRUCTOSAMINE: Fructosamine: 277 umol/L (ref 0–285)

## 2018-12-14 ENCOUNTER — Other Ambulatory Visit: Payer: Self-pay | Admitting: Family

## 2018-12-29 ENCOUNTER — Encounter: Payer: Self-pay | Admitting: Family

## 2018-12-29 ENCOUNTER — Telehealth (INDEPENDENT_AMBULATORY_CARE_PROVIDER_SITE_OTHER): Payer: Managed Care, Other (non HMO) | Admitting: Family

## 2018-12-29 DIAGNOSIS — F32A Depression, unspecified: Secondary | ICD-10-CM

## 2018-12-29 DIAGNOSIS — F3170 Bipolar disorder, currently in remission, most recent episode unspecified: Secondary | ICD-10-CM | POA: Diagnosis not present

## 2018-12-29 DIAGNOSIS — F419 Anxiety disorder, unspecified: Secondary | ICD-10-CM

## 2018-12-29 DIAGNOSIS — I10 Essential (primary) hypertension: Secondary | ICD-10-CM

## 2018-12-29 DIAGNOSIS — E1165 Type 2 diabetes mellitus with hyperglycemia: Secondary | ICD-10-CM | POA: Diagnosis not present

## 2018-12-29 DIAGNOSIS — E785 Hyperlipidemia, unspecified: Secondary | ICD-10-CM

## 2018-12-29 DIAGNOSIS — F329 Major depressive disorder, single episode, unspecified: Secondary | ICD-10-CM

## 2018-12-29 MED ORDER — ATORVASTATIN CALCIUM 40 MG PO TABS: 40.0000 mg | ORAL_TABLET | Freq: Every day | ORAL | 1 refills | Status: DC

## 2018-12-29 NOTE — Progress Notes (Signed)
Virtual Visit via Video Note  I connected with Tanya Wilcox on 12/29/18 at  7:20 AM EDT by a video enabled telemedicine application and verified that I am speaking with the correct person using two identifiers. This visit type was conducted due to national recommendations for restrictions regarding the COVID-19 Pandemic (e.g. social distancing).  This format is felt to be most appropriate for this patient at this time.   I discussed the limitations of evaluation and management by telemedicine and the availability of in person appointments. The patient expressed understanding and agreed to proceed.  Only the patient and myself were on today's video visit. The patient was at home and I was at home at the time of today's visit.   History of Present Illness:  DM2- patient is followed by endocrinology. Admits that she was doing better with her diet back before Thanksgiving when her A1C was 6.7   Lab Results  Component Value Date   HGBA1C 7.3 (A) 10/22/2018   HGBA1C 6.7 (H) 06/02/2018   HGBA1C 7.2 (H) 02/28/2018   Lab Results  Component Value Date   MICROALBUR <0.7 12/03/2018   LDLCALC 80 10/01/2018   CREATININE 0.85 12/03/2018   Hyperlipidemia- maintained on lipitor.  Denies myalgia.   Lab Results  Component Value Date   CHOL 136 10/01/2018   HDL 37 (L) 10/01/2018   LDLCALC 80 10/01/2018   TRIG 107 10/01/2018   CHOLHDL 3.7 10/01/2018   HTN- maintained lisinopril.  Bipolar disorder/depression- she reports that she is now back on her abilify and her mood is much improved. Reports anxiety is also improved.   Past Medical History:  Diagnosis Date  . Alcohol use   . Anxiety   . Bipolar disorder ([place])   . Depression   . Gestational diabetes    borderline II  . Osteoarthritis    knee  . Tobacco abuse      Social History   Socioeconomic History  . Marital status: Single    Spouse name: Not on file  . Number of children: Not on file  . Years of education: Not on file  .  Highest education level: Not on file  Occupational History  . Occupation: Sport and exercise psychologist: [employer]  Social Needs  . Financial resource strain: Not on file  . Food insecurity:    Worry: Not on file    Inability: Not on file  . Transportation needs:    Medical: Not on file    Non-medical: Not on file  Tobacco Use  . Smoking status: Current Every Day Smoker    Types: Cigarettes    Last attempt to quit: 01/20/2013    Years since quitting: 5.9  . Smokeless tobacco: Never Used  . Tobacco comment: 1 pack per day  Substance and Sexual Activity  . Alcohol use: Yes    Alcohol/week: 0.0 standard drinks    Comment: occasionally  . Drug use: No  . Sexual activity: Not on file  Lifestyle  . Physical activity:    Days per week: Not on file    Minutes per session: Not on file  . Stress: Not on file  Relationships  . Social connections:    Talks on phone: Not on file    Gets together: Not on file    Attends religious service: Not on file    Active member of club or organization: Not on file    Attends meetings of clubs or organizations: Not on file    Relationship  status: Not on file  . Intimate partner violence:    Fear of current or ex partner: Not on file    Emotionally abused: Not on file    Physically abused: Not on file    Forced sexual activity: Not on file  Other Topics Concern  . Not on file  Social History Narrative   Lives with daughter   Works at [employer]   Enjoys sleeping   1 dog   Completed high school, some college    Past Surgical History:  Procedure Laterality Date  . CHOLECYSTECTOMY  2003    Family History  Problem Relation Age of Onset  . Hypertension Mother   . Hyperlipidemia Mother   . Alcohol abuse Father     No Active Allergies  Current Outpatient Medications on File Prior to Visit  Medication Sig Dispense Refill  . ALPRAZolam (XANAX) 0.5 MG tablet Take 0.5 mg by mouth 2 (two) times daily as needed.   1  . ARIPiprazole  (ABILIFY) 5 MG tablet Take 1 tablet by mouth daily.  1  . aspirin EC 81 MG tablet Take 81 mg by mouth daily.    . canagliflozin (INVOKANA) 100 MG TABS tablet 1 tablet before breakfast 30 tablet 3  . glucose blood (ONE TOUCH ULTRA TEST) test strip USE TO TEST THREE TIMES DAILY BEFORE MEALS AS DIRECTED 300 each 1  . Insulin Glargine, 2 Unit Dial, (TOUJEO MAX SOLOSTAR) 300 UNIT/ML SOPN Inject 78 Units into the skin daily. Inject 78 units under the skin once daily.    . Insulin Pen Needle (B-D ULTRAFINE III SHORT PEN) 31G X 8 MM MISC USE DAILY AS DIRECTED 100 each 1  . levonorgestrel (MIRENA) 20 MCG/24HR IUD 1 each by Intrauterine route once.    Marland Kitchen lisinopril (PRINIVIL,ZESTRIL) 20 MG tablet TAKE 1 TABLET BY MOUTH DAILY 90 tablet 1  . magnesium citrate SOLN Take 1 Bottle by mouth daily.     . Multiple Vitamin (MULTIVITAMIN) tablet Take 1 tablet by mouth daily.    . sertraline (ZOLOFT) 25 MG tablet Take 25 mg by mouth daily.  1  . TRULICITY 1.5 NG/2.9BM SOPN INJECT 1 PEN INTO THE SKIN WEEKLY 6 mL 0  . valACYclovir (VALTREX) 500 MG tablet Take 1 tablet (500 mg total) by mouth daily. 90 tablet 1  . VITAMIN D, CHOLECALCIFEROL, PO Take by mouth.     No current facility-administered medications on file prior to visit.     There were no vitals taken for this visit.      Observations/Objective:   Gen: Awake, alert, no acute distress Resp: Breathing is even and non-labored Psych: calm/pleasant demeanor Neuro: Alert and Oriented x 3, + facial symmetry, speech is clear.  Assessment and Plan:  1) DM2- Clinically stable, has upcoming appointment with Endo.    2) HTN- tolerating ACE.  She will check bp once daily for 1 week and then send me her readings via mychart.  3) Hyperlipidemia- Discussed importance of exercise in increasing her HDL. Tolerating statin, continue same.  4) depression/bipolar disorder- improved. Management per psychiatry.    Follow Up Instructions:    I discussed the  assessment and treatment plan with the patient. The patient was provided an opportunity to ask questions and all were answered. The patient agreed with the plan and demonstrated an understanding of the instructions.   The patient was advised to call back or seek an in-person evaluation if the symptoms worsen or if the condition fails  to improve as anticipated.    Nance Pear, NP

## 2018-12-30 ENCOUNTER — Ambulatory Visit: Payer: Self-pay | Admitting: Family

## 2019-01-01 ENCOUNTER — Other Ambulatory Visit: Payer: Self-pay

## 2019-01-01 MED ORDER — CANAGLIFLOZIN 100 MG PO TABS: ORAL_TABLET | ORAL | 1 refills | Status: DC

## 2019-01-07 LAB — HM DIABETES EYE EXAM

## 2019-01-26 ENCOUNTER — Other Ambulatory Visit: Payer: Self-pay

## 2019-01-26 ENCOUNTER — Other Ambulatory Visit (INDEPENDENT_AMBULATORY_CARE_PROVIDER_SITE_OTHER): Payer: Managed Care, Other (non HMO)

## 2019-01-26 DIAGNOSIS — Z794 Long term (current) use of insulin: Secondary | ICD-10-CM | POA: Diagnosis not present

## 2019-01-26 DIAGNOSIS — E1165 Type 2 diabetes mellitus with hyperglycemia: Secondary | ICD-10-CM

## 2019-01-26 LAB — COMPREHENSIVE METABOLIC PANEL
ALT: 17 U/L (ref 0–35)
AST: 14 U/L (ref 0–37)
Albumin: 3.9 g/dL (ref 3.5–5.2)
Alkaline Phosphatase: 73 U/L (ref 39–117)
BUN: 8 mg/dL (ref 6–23)
CO2: 28 mEq/L (ref 19–32)
Calcium: 9 mg/dL (ref 8.4–10.5)
Chloride: 106 mEq/L (ref 96–112)
Creatinine, Ser: 0.88 mg/dL (ref 0.40–1.20)
GFR: 83.04 mL/min (ref >60.00)
Glucose, Bld: 143 mg/dL — ABNORMAL HIGH (ref 70–99)
Potassium: 3.8 mEq/L (ref 3.5–5.1)
Sodium: 140 mEq/L (ref 135–145)
Total Bilirubin: 0.3 mg/dL (ref 0.2–1.2)
Total Protein: 6.8 g/dL (ref 6.0–8.3)

## 2019-01-26 LAB — HEMOGLOBIN A1C: Hgb A1c MFr Bld: 7 % — ABNORMAL HIGH (ref 4.6–6.5)

## 2019-01-28 ENCOUNTER — Encounter: Payer: Self-pay | Admitting: Endocrinology

## 2019-01-28 ENCOUNTER — Ambulatory Visit: Payer: Managed Care, Other (non HMO) | Admitting: Endocrinology

## 2019-01-28 ENCOUNTER — Other Ambulatory Visit: Payer: Self-pay

## 2019-01-28 VITALS — BP 122/70 | HR 88 | Ht 67.5 in | Wt 245.0 lb

## 2019-01-28 DIAGNOSIS — E1165 Type 2 diabetes mellitus with hyperglycemia: Secondary | ICD-10-CM

## 2019-01-28 DIAGNOSIS — Z794 Long term (current) use of insulin: Secondary | ICD-10-CM

## 2019-01-28 NOTE — Patient Instructions (Addendum)
Check blood sugars on waking up 3-4 days a week  Also check blood sugars about 2 hours after meals and do this after different meals by rotation  Recommended blood sugar levels on waking up are 90-130 and about 2 hours after meal is 130-160  Please bring your blood sugar monitor to each visit, thank you  Call when ozempic needed  Start OZEMPIC injections by dialing 0.25 mg on the pen as shown once weekly on the same day of the week.   You may inject in the sides of the stomach, outer thigh or arm as indicated in the brochure given. If you have any difficulties using the pen see the video at CompPlans.[email or web address]  You will feel fullness of the stomach with starting the medication and should try to keep the portions at meals small.  You may experience nausea in the first few days which usually gets better over time    After 2 weeks increase the dose to 0.5 mg weekly  If you have any questions or persistent side effects please call the office   You may also talk to a nurse educator with [employer] at [phone] Useful website: [email or web address]

## 2019-01-28 NOTE — Progress Notes (Signed)
Patient ID: Tanya Wilcox, female   DOB: 05/27/1971, 48 y.o.   MRN: [id number]          Reason for Appointment: Follow-up for Type 2 Diabetes  Referring PCP: Debbrah Alar   History of Present Illness:          Date of diagnosis of type 2 diabetes mellitus: 2007       Background history:   She does not remember the initial symptoms at diagnosis and level of blood sugar and no previous records available Her A1c was 7% in 2014 and as high as 9.9 in 2015 She has previously taken metformin, Amaryl, Invokana, Actos and Januvia with variable level of control Lowest A1c was 6.4 in 2017 She was started in  7/18 on basal insulin using Basaglar presumably for worsening blood sugar control  Recent history:   INSULIN regimen is: 84 units of TOUJEO insulin, in am  Non-insulin hypoglycemic drugs the patient is taking are: Trulicity 1.5 mg weekly, Invokana 100 mg daily  Current management, blood sugar patterns and problems identified:  Her A1c is slightly  better at 7% compared to 7.3  On her last visit she was supposed to switch from Trulicity to [place] but she thinks she had a 90-day supply and is still taking the old prescription  However she thinks she is doing better with cutting back on portions, some reduction in drinking Pepsi  However has not lost any weight  She is starting to do a little bit more exercise with video aerobics at home but only occasionally  Her blood sugars can be as high as 201 in the morning and they are higher when she drinks Pepsi at night or during the night  She did increase her Toujeo by 6 units as directed  She has only a few readings later in the day and not clear if they are consistently controlled especially recently        Side effects from medications have been: Nausea from 2000 mg of metformin, recurrent yeast infections with Invokana  Compliance with the medical regimen: Variable Hypoglycemia:   None  Glucose monitoring:  done about 1  times a day         Glucometer: One Touch.       Blood Glucose readings from monitor download:  FASTING average around 148, Midday 135, afternoon 169 and evening about 140+  AVERAGE overall 144  Previous range:  PRE-MEAL Fasting Lunch Dinner Bedtime Overall  Glucose range:  122-239   82-263    Mean/median:  197  158  189   177    Dietician visit, most recent: 03/2018  Weight history:  Wt Readings from Last 3 Encounters:  01/28/19 245 lb (111.1 kg)  12/03/18 244 lb (110.7 kg)  10/22/18 250 lb (113.4 kg)    Glycemic control:   Lab Results  Component Value Date   HGBA1C 7.0 (H) 01/26/2019   HGBA1C 7.3 (A) 10/22/2018   HGBA1C 6.7 (H) 06/02/2018   Lab Results  Component Value Date   MICROALBUR <0.7 12/03/2018   [place] 80 10/01/2018   CREATININE 0.88 01/26/2019   Lab Results  Component Value Date   MICRALBCREAT 1.2 12/03/2018    Lab Results  Component Value Date   FRUCTOSAMINE 277 12/03/2018      Allergies as of 01/28/2019   No Active Allergies     Medication List       Accurate as of January 28, 2019  8:23 PM. If you have any  questions, ask your nurse or doctor.        ALPRAZolam 0.5 MG tablet Commonly known as: XANAX Take 0.5 mg by mouth 2 (two) times daily as needed.   ARIPiprazole 5 MG tablet Commonly known as: ABILIFY Take 1 tablet by mouth daily.   aspirin EC 81 MG tablet Take 81 mg by mouth daily.   atorvastatin 40 MG tablet Commonly known as: LIPITOR Take 1 tablet (40 mg total) by mouth daily.   canagliflozin 100 MG Tabs tablet Commonly known as: Invokana 1 tablet before breakfast   glucose blood test strip Commonly known as: ONE TOUCH ULTRA TEST USE TO TEST THREE TIMES DAILY BEFORE MEALS AS DIRECTED   Insulin Pen Needle 31G X 8 MM Misc Commonly known as: B-D ULTRAFINE III SHORT PEN USE DAILY AS DIRECTED   levonorgestrel 20 MCG/24HR IUD Commonly known as: MIRENA 1 each by Intrauterine route once.   lisinopril 20 MG tablet  Commonly known as: ZESTRIL TAKE 1 TABLET BY MOUTH DAILY   magnesium citrate Soln Take 1 Bottle by mouth daily.   multivitamin tablet Take 1 tablet by mouth daily.   sertraline 25 MG tablet Commonly known as: ZOLOFT Take 25 mg by mouth daily.   Toujeo Max SoloStar 300 UNIT/ML Sopn Generic drug: Insulin Glargine (2 Unit Dial) Inject 84 Units into the skin daily. Inject 84 units under the skin once daily.   Trulicity 1.5 WH/6.7RF Sopn Generic drug: Dulaglutide INJECT 1 PEN INTO THE SKIN WEEKLY   valACYclovir 500 MG tablet Commonly known as: VALTREX Take 1 tablet (500 mg total) by mouth daily.   VITAMIN D (CHOLECALCIFEROL) PO Take by mouth.       Allergies:  No Active Allergies  Past Medical History:  Diagnosis Date  . Alcohol use   . Anxiety   . Bipolar disorder ([place])   . Depression   . Gestational diabetes    borderline II  . Osteoarthritis    knee  . Tobacco abuse     Past Surgical History:  Procedure Laterality Date  . CHOLECYSTECTOMY  2003    Family History  Problem Relation Age of Onset  . Hypertension Mother   . Hyperlipidemia Mother   . Alcohol abuse Father     Social History:  reports that she has been smoking cigarettes. She has never used smokeless tobacco. She reports current alcohol use. She reports that she does not use drugs.   Review of Systems   Lipid history: On adequate treatment by PCP with Lipitor 40 mg    Lab Results  Component Value Date   CHOL 136 10/01/2018   HDL 37 (L) 10/01/2018   LDLCALC 80 10/01/2018   TRIG 107 10/01/2018   CHOLHDL 3.7 10/01/2018           Hypertension: Treated with lisinopril 20 mg by PCP  BP Readings from Last 3 Encounters:  01/28/19 122/70  12/03/18 140/78  10/22/18 122/78    Most recent eye exam was in 12/2018, no retinopathy  Most recent foot exam: 10/2017   LABS:  Lab on 01/26/2019  Component Date Value Ref Range Status  . Sodium 01/26/2019 140  135 - 145 mEq/L Final  .  Potassium 01/26/2019 3.8  3.5 - 5.1 mEq/L Final  . Chloride 01/26/2019 106  96 - 112 mEq/L Final  . CO2 01/26/2019 28  19 - 32 mEq/L Final  . Glucose, Bld 01/26/2019 143* 70 - 99 mg/dL Final  . BUN 01/26/2019 8  6 - 23  mg/dL Final  . Creatinine, Ser 01/26/2019 0.88  0.40 - 1.20 mg/dL Final  . Total Bilirubin 01/26/2019 0.3  0.2 - 1.2 mg/dL Final  . Alkaline Phosphatase 01/26/2019 73  39 - 117 U/L Final  . AST 01/26/2019 14  0 - 37 U/L Final  . ALT 01/26/2019 17  0 - 35 U/L Final  . Total Protein 01/26/2019 6.8  6.0 - 8.3 g/dL Final  . Albumin 01/26/2019 3.9  3.5 - 5.2 g/dL Final  . Calcium 01/26/2019 9.0  8.4 - 10.5 mg/dL Final  . GFR 01/26/2019 83.04  >60.00 mL/min Final  . Hgb A1c MFr Bld 01/26/2019 7.0* 4.6 - 6.5 % Final   Glycemic Control Guidelines for People with Diabetes:Non Diabetic:  <6%Goal of Therapy: <7%Additional Action Suggested:  >8%     Physical Examination:  BP 122/70 (BP Location: Left Arm, Patient Position: Sitting, Cuff Size: Large)   Pulse 88   Ht 5' 7.5" (1.715 m)   Wt 245 lb (111.1 kg)   SpO2 97%   BMI 37.81 kg/m          ASSESSMENT:  Diabetes type 2, BMI 38  See history of present illness for detailed discussion of current diabetes management, blood sugar patterns and problems identified   Her A1c is 7, previously 7.3  Most of her difficulties with control is related to inadequate control of diet Also not starting exercise as directed Blood sugar may be higher when she drinks regular soft drinks  She was supposed to switch to Ozempic but still is trying to use of her Trulicity supply  HYPERTENSION: Well controlled and she will continue lisinopril unchanged  Recent eye exam normal    PLAN:   More regular exercise and she agrees to do her exercise video at home She will try to cut back on portions, carbohydrates and regular soft drinks further She will switch to Ozempic as soon as she is finished with Toujeo and she can call for prescription  Instructions for the dosage given again Once her blood sugars are consistently below 100 she can cut back on Toujeo by 6 units   Patient Instructions  Check blood sugars on waking up 3-4 days a week  Also check blood sugars about 2 hours after meals and do this after different meals by rotation  Recommended blood sugar levels on waking up are 90-130 and about 2 hours after meal is 130-160  Please bring your blood sugar monitor to each visit, thank you  Call when ozempic needed  Start OZEMPIC injections by dialing 0.25 mg on the pen as shown once weekly on the same day of the week.   You may inject in the sides of the stomach, outer thigh or arm as indicated in the brochure given. If you have any difficulties using the pen see the video at CompPlans.[email or web address]  You will feel fullness of the stomach with starting the medication and should try to keep the portions at meals small.  You may experience nausea in the first few days which usually gets better over time    After 2 weeks increase the dose to 0.5 mg weekly  If you have any questions or persistent side effects please call the office   You may also talk to a nurse educator with [employer] at [phone] Useful website: [email or web address]             Elayne Snare 01/28/2019, 8:23 PM   Note: This office note was prepared with Dragon voice recognition  system technology. Any transcriptional errors that result from this process are unintentional.

## 2019-02-02 ENCOUNTER — Other Ambulatory Visit: Payer: Managed Care, Other (non HMO)

## 2019-03-03 ENCOUNTER — Telehealth: Payer: Self-pay | Admitting: *Deleted

## 2019-03-03 NOTE — Telephone Encounter (Signed)
Letter faxed to [facility] [place] [facility]

## 2019-03-12 ENCOUNTER — Ambulatory Visit (INDEPENDENT_AMBULATORY_CARE_PROVIDER_SITE_OTHER): Payer: 59 | Admitting: Psychiatry

## 2019-03-12 DIAGNOSIS — F509 Eating disorder, unspecified: Secondary | ICD-10-CM | POA: Diagnosis not present

## 2019-03-16 ENCOUNTER — Other Ambulatory Visit: Payer: Self-pay

## 2019-03-16 MED ORDER — OZEMPIC (0.25 OR 0.5 MG/DOSE) 2 MG/1.5ML ~~LOC~~ SOPN: 0.2500 mg | PEN_INJECTOR | SUBCUTANEOUS | 1 refills | Status: DC

## 2019-04-01 ENCOUNTER — Other Ambulatory Visit: Payer: Self-pay

## 2019-04-01 ENCOUNTER — Ambulatory Visit (INDEPENDENT_AMBULATORY_CARE_PROVIDER_SITE_OTHER): Payer: Managed Care, Other (non HMO) | Admitting: Family

## 2019-04-01 VITALS — BP 155/89 | HR 73 | Wt 247.4 lb

## 2019-04-01 DIAGNOSIS — I10 Essential (primary) hypertension: Secondary | ICD-10-CM

## 2019-04-01 DIAGNOSIS — F3170 Bipolar disorder, currently in remission, most recent episode unspecified: Secondary | ICD-10-CM

## 2019-04-01 DIAGNOSIS — E1165 Type 2 diabetes mellitus with hyperglycemia: Secondary | ICD-10-CM

## 2019-04-01 DIAGNOSIS — E785 Hyperlipidemia, unspecified: Secondary | ICD-10-CM

## 2019-04-01 NOTE — Progress Notes (Signed)
Virtual Visit via Video Note  I connected with Tanya Wilcox on 04/01/19 at  7:00 AM EDT by a video enabled telemedicine application and verified that I am speaking with the correct person using two identifiers.  Location: Patient: home Provider: work   I discussed the limitations of evaluation and management by telemedicine and the availability of in person appointments. The patient expressed understanding and agreed to proceed.  History of Present Illness:  Patient is a 48 yr old female who presents today for follow up.  HTN- maintained on lisinopril 20mg .  She reports good compliance with her medication.  BP Readings from Last 3 Encounters:  04/01/19 (!) 155/89  01/28/19 122/70  12/03/18 140/78   Hyperlipidemia- maintained on lipitor 40mg .   Lab Results  Component Value Date   CHOL 136 10/01/2018   HDL 37 (L) 10/01/2018   LDLCALC 80 10/01/2018   TRIG 107 10/01/2018   CHOLHDL 3.7 10/01/2018   DM2-she continues to work with endocrinology.  She is currently in the process of completing evaluation for gastric bypass.  Surgery is tentatively scheduled for the end of October.  She is working on quitting smoking prior to her surgery.  Lab Results  Component Value Date   HGBA1C 7.0 (H) 01/26/2019   HGBA1C 7.3 (A) 10/22/2018   HGBA1C 6.7 (H) 06/02/2018   Lab Results  Component Value Date   MICROALBUR <0.7 12/03/2018   [place] 80 10/01/2018   CREATININE 0.88 01/26/2019   Bipolar Disorder- notes some increased anxiety recently.  Overall mood is stable.  She has been working from home.  Recently started with a new therapist who she is pleased with.  She continues to work with psychiatry. Past Medical History:  Diagnosis Date  . Alcohol use   . Anxiety   . Bipolar disorder ([place])   . Depression   . Gestational diabetes    borderline II  . Osteoarthritis    knee  . Tobacco abuse      Social History   Socioeconomic History  . Marital status: Single    Spouse name:  Not on file  . Number of children: Not on file  . Years of education: Not on file  . Highest education level: Not on file  Occupational History  . Occupation: Sport and exercise psychologist: [employer]  Social Needs  . Financial resource strain: Not on file  . Food insecurity    Worry: Not on file    Inability: Not on file  . Transportation needs    Medical: Not on file    Non-medical: Not on file  Tobacco Use  . Smoking status: Current Every Day Smoker    Types: Cigarettes    Last attempt to quit: 01/20/2013    Years since quitting: 6.1  . Smokeless tobacco: Never Used  . Tobacco comment: 1 pack per day  Substance and Sexual Activity  . Alcohol use: Yes    Alcohol/week: 0.0 standard drinks    Comment: occasionally  . Drug use: No  . Sexual activity: Not on file  Lifestyle  . Physical activity    Days per week: Not on file    Minutes per session: Not on file  . Stress: Not on file  Relationships  . Social Herbalist on phone: Not on file    Gets together: Not on file    Attends religious service: Not on file    Active member of club or organization: Not on file  Attends meetings of clubs or organizations: Not on file    Relationship status: Not on file  . Intimate partner violence    Fear of current or ex partner: Not on file    Emotionally abused: Not on file    Physically abused: Not on file    Forced sexual activity: Not on file  Other Topics Concern  . Not on file  Social History Narrative   Lives with daughter   Works at [employer] and [employer]   Enjoys sleeping   1 dog   Completed high school, some college    Past Surgical History:  Procedure Laterality Date  . CHOLECYSTECTOMY  2003    Family History  Problem Relation Age of Onset  . Hypertension Mother   . Hyperlipidemia Mother   . Alcohol abuse Father     No Active Allergies  Current Outpatient Medications on File Prior to Visit  Medication Sig Dispense Refill  . ALPRAZolam (XANAX) 0.5  MG tablet Take 0.5 mg by mouth 2 (two) times daily as needed.   1  . ARIPiprazole (ABILIFY) 5 MG tablet Take 1 tablet by mouth daily.  1  . aspirin EC 81 MG tablet Take 81 mg by mouth daily.    Marland Kitchen atorvastatin (LIPITOR) 40 MG tablet Take 1 tablet (40 mg total) by mouth daily. 90 tablet 1  . canagliflozin (INVOKANA) 100 MG TABS tablet 1 tablet before breakfast 90 tablet 1  . glucose blood (ONE TOUCH ULTRA TEST) test strip USE TO TEST THREE TIMES DAILY BEFORE MEALS AS DIRECTED 300 each 1  . Insulin Glargine, 2 Unit Dial, (TOUJEO MAX SOLOSTAR) 300 UNIT/ML SOPN Inject 84 Units into the skin daily. Inject 84 units under the skin once daily.    . Insulin Pen Needle (B-D ULTRAFINE III SHORT PEN) 31G X 8 MM MISC USE DAILY AS DIRECTED 100 each 1  . levonorgestrel (MIRENA) 20 MCG/24HR IUD 1 each by Intrauterine route once.    Marland Kitchen lisinopril (PRINIVIL,ZESTRIL) 20 MG tablet TAKE 1 TABLET BY MOUTH DAILY 90 tablet 1  . magnesium citrate SOLN Take 1 Bottle by mouth daily.     . Multiple Vitamin (MULTIVITAMIN) tablet Take 1 tablet by mouth daily.    . Semaglutide,0.25 or 0.5MG /DOS, (OZEMPIC, 0.25 OR 0.5 MG/DOSE,) 2 MG/1.5ML SOPN Inject 0.25 mg into the skin once a week. Inject 0.25mg  under the skin once weekly for two weeks. Increase to 0.5mg  on week 3. 1 pen 1  . sertraline (ZOLOFT) 25 MG tablet Take 25 mg by mouth daily.  1  . valACYclovir (VALTREX) 500 MG tablet Take 1 tablet (500 mg total) by mouth daily. 90 tablet 1  . VITAMIN D, CHOLECALCIFEROL, PO Take by mouth.     No current facility-administered medications on file prior to visit.     BP (!) 155/89   Pulse 73   Wt 247 lb 6.4 oz (112.2 kg)   BMI 38.18 kg/m      Observations/Objective:   Gen: Awake, alert, no acute distress Resp: Breathing is even and non-labored Psych: calm/pleasant demeanor Neuro: Alert and Oriented x 3, + facial symmetry, speech is clear.   Assessment and Plan:  Hypertension-blood pressure is above goal today.  I have  advised the patient to check her blood pressure once daily for the next week and then send me her readings via my chart.  If blood pressure remains elevated we will need to adjust medication.  I would like to check her blood pressure in  the office in 6 weeks.  Diabetes type 2-stable/improved.  Advised patient to continue to follow-up with endocrinology.  I advised her to obtain a flu shot at her local pharmacy or to call her office for nurse visit.  Bipolar depression- stable.  Management per psychiatry.  Tobacco abuse- she is working on quitting smoking.  I encouraged her to keep up the good work.   Follow Up Instructions:    I discussed the assessment and treatment plan with the patient. The patient was provided an opportunity to ask questions and all were answered. The patient agreed with the plan and demonstrated an understanding of the instructions.   The patient was advised to call back or seek an in-person evaluation if the symptoms worsen or if the condition fails to improve as anticipated.  Nance Pear, NP

## 2019-04-24 ENCOUNTER — Other Ambulatory Visit: Payer: Self-pay | Admitting: Endocrinology

## 2019-04-27 ENCOUNTER — Encounter: Payer: Managed Care, Other (non HMO) | Attending: General Surgery | Admitting: Dietician

## 2019-04-27 ENCOUNTER — Encounter: Payer: Self-pay | Admitting: Dietician

## 2019-04-27 ENCOUNTER — Other Ambulatory Visit (INDEPENDENT_AMBULATORY_CARE_PROVIDER_SITE_OTHER): Payer: Managed Care, Other (non HMO)

## 2019-04-27 ENCOUNTER — Other Ambulatory Visit: Payer: Self-pay

## 2019-04-27 DIAGNOSIS — E1165 Type 2 diabetes mellitus with hyperglycemia: Secondary | ICD-10-CM | POA: Diagnosis not present

## 2019-04-27 DIAGNOSIS — E669 Obesity, unspecified: Secondary | ICD-10-CM | POA: Diagnosis present

## 2019-04-27 DIAGNOSIS — Z794 Long term (current) use of insulin: Secondary | ICD-10-CM | POA: Diagnosis not present

## 2019-04-27 LAB — BASIC METABOLIC PANEL
BUN: 10 mg/dL (ref 6–23)
CO2: 25 mEq/L (ref 19–32)
Calcium: 9.6 mg/dL (ref 8.4–10.5)
Chloride: 102 mEq/L (ref 96–112)
Creatinine, Ser: 0.82 mg/dL (ref 0.40–1.20)
GFR: 89.99 mL/min (ref >60.00)
Glucose, Bld: 130 mg/dL — ABNORMAL HIGH (ref 70–99)
Potassium: 4.1 mEq/L (ref 3.5–5.1)
Sodium: 136 mEq/L (ref 135–145)

## 2019-04-27 LAB — HEMOGLOBIN A1C: Hgb A1c MFr Bld: 7.7 % — ABNORMAL HIGH (ref 4.6–6.5)

## 2019-04-27 NOTE — Progress Notes (Signed)
Pre-Operative Nutrition Class   Appt Start Time: 8:15am     End Time: 10:10am   Patient was seen on 04/27/2019 for Pre-Operative Bariatric Surgery Education at [facility].    Surgery date: ? Surgery type: Sleeve    Start weight at [facility]: 245.2 lbs (date: 07/24/2018) Weight today: 241.8 lbs BMI: 37.3 kg/m2   Samples Given per MNT Protocol (pt educated on appropriate usage) [place] Drink Lot# [id number] CCP [id number] Exp: 05/2020  Bariatric Advantage MVI Lot# [id number] Exp: 02/2020  Bariatric Fusion Calcium   The following the learning objectives were met by the patient during this course:  Identify Pre-Op Dietary Goals and will begin 2 weeks pre-operatively  Identify appropriate sources of fluids and proteins   State protein recommendations and appropriate sources pre and post-operatively  Identify Post-Operative Dietary Goals and will follow for 2 weeks post-operatively  Identify appropriate multivitamin and calcium sources  Describe the need for physical activity post-operatively and will follow MD recommendations  State when to call healthcare provider regarding medication questions or post-operative complications   Handouts given include:  Pre-Op Bariatric Surgery Diet Handout  Protein Shake Handout  Post-Op Bariatric Surgery Nutrition Handout  [facility] Program Information Flyer  Support Group Information Flyer  [facility] Bariatric Supplements Price List   Follow-Up Plan: Patient will follow-up at [facility] 2 weeks post operatively for diet advancement per MD.

## 2019-04-29 ENCOUNTER — Encounter: Payer: Self-pay | Admitting: Endocrinology

## 2019-04-29 ENCOUNTER — Other Ambulatory Visit: Payer: Self-pay

## 2019-04-29 ENCOUNTER — Ambulatory Visit (INDEPENDENT_AMBULATORY_CARE_PROVIDER_SITE_OTHER): Payer: Managed Care, Other (non HMO) | Admitting: Endocrinology

## 2019-04-29 ENCOUNTER — Other Ambulatory Visit: Payer: Self-pay | Admitting: Endocrinology

## 2019-04-29 DIAGNOSIS — Z6837 Body mass index (BMI) 37.0-37.9, adult: Secondary | ICD-10-CM | POA: Diagnosis not present

## 2019-04-29 DIAGNOSIS — Z794 Long term (current) use of insulin: Secondary | ICD-10-CM

## 2019-04-29 DIAGNOSIS — E1165 Type 2 diabetes mellitus with hyperglycemia: Secondary | ICD-10-CM | POA: Diagnosis not present

## 2019-04-29 MED ORDER — INSULIN LISPRO (1 UNIT DIAL) 100 UNIT/ML (KWIKPEN): PEN_INJECTOR | SUBCUTANEOUS | 1 refills | Status: DC

## 2019-04-29 MED ORDER — ONETOUCH VERIO FLEX SYSTEM W/DEVICE KIT: PACK | 0 refills | Status: DC

## 2019-04-29 MED ORDER — GLUCOSE BLOOD VI STRP: ORAL_STRIP | 2 refills | Status: DC

## 2019-04-29 NOTE — Progress Notes (Addendum)
Patient ID: Tanya Wilcox, female   DOB: 04-07-1971, 48 y.o.   MRN: [id number]          Reason for Appointment: Follow-up for Type 2 Diabetes  Today's office visit was provided via telemedicine using video technique The patient was explained the limitations of evaluation and management by telemedicine and the availability of in person appointments.  The patient understood the limitations and agreed to proceed. Patient also understood that the telehealth visit is billable. . Location of the patient: Patient's home . Location of the provider: Physician office Only the patient and myself were participating in the encounter    Referring PCP: Debbrah Alar   History of Present Illness:          Date of diagnosis of type 2 diabetes mellitus: 2007       Background history:   She does not remember the initial symptoms at diagnosis and level of blood sugar and no previous records available Her A1c was 7% in 2014 and as high as 9.9 in 2015 She has previously taken metformin, Amaryl, Invokana, Actos and Januvia with variable level of control Lowest A1c was 6.4 in 2017 She was started in  7/18 on basal insulin using Basaglar presumably for worsening blood sugar control  Recent history:   INSULIN regimen is: 84 units of TOUJEO insulin, in am  Non-insulin hypoglycemic drugs the patient is taking are: Ozempic 0.5 mg weekly, Invokana 100 mg daily  Current management, blood sugar patterns and problems identified:  Her A1c is higher at 7.7  However she has had blood sugars are high as 374 nonfasting  She felt that because of quitting smoking she may be eating more and also foods like gummy bears  However she has given up regular soft drinks  Despite working with the dietitian for her bariatric program she appears to be still eating high-fat foods including meat croissants  Also not doing any consistent exercise  Surprisingly her weight is down 6 pounds reportedly  Fasting blood  sugars are mostly high although only 130 in the lab, not checked at home on the same day  With switching from Trulicity to [place] she has had no nausea and is now taking 0.5 mg weekly  She recently had a yeast infection from [place] otherwise has not had any problems more recently with this        Side effects from medications have been: Nausea from 2000 mg of metformin  Compliance with the medical regimen: Variable Hypoglycemia:   None  Glucose monitoring:  done about 1 times a day         Glucometer: One Touch ultra 2.       Blood Glucose readings from monitor download:   PRE-MEAL Fasting Lunch Dinner Bedtime Overall  Glucose range:  165-218  280, 252     Mean/median:      217   POST-MEAL PC Breakfast PC Lunch PC Dinner  Glucose range:  200, 374   219, 273  Mean/median:      PREVIOUS readings:  FASTING average around 148, Midday 135, afternoon 169 and evening about 140+  AVERAGE overall 144   Weight history:  Wt Readings from Last 3 Encounters:  04/27/19 241 lb 12.8 oz (109.7 kg)  04/01/19 247 lb 6.4 oz (112.2 kg)  01/28/19 245 lb (111.1 kg)    Glycemic control:   Lab Results  Component Value Date   HGBA1C 7.7 (H) 04/27/2019   HGBA1C 7.0 (H) 01/26/2019   HGBA1C  7.3 (A) 10/22/2018   Lab Results  Component Value Date   MICROALBUR <0.7 12/03/2018   LDLCALC 80 10/01/2018   CREATININE 0.82 04/27/2019   Lab Results  Component Value Date   MICRALBCREAT 1.2 12/03/2018    Lab Results  Component Value Date   FRUCTOSAMINE 277 12/03/2018      Allergies as of 04/29/2019   No Active Allergies     Medication List       Accurate as of April 29, 2019  1:37 PM. If you have any questions, ask your nurse or doctor.        ALPRAZolam 0.5 MG tablet Commonly known as: XANAX Take 0.5 mg by mouth 2 (two) times daily as needed.   ARIPiprazole 5 MG tablet Commonly known as: ABILIFY Take 1 tablet by mouth daily.   aspirin EC 81 MG tablet Take 81 mg by  mouth daily.   atorvastatin 40 MG tablet Commonly known as: LIPITOR Take 1 tablet (40 mg total) by mouth daily.   canagliflozin 100 MG Tabs tablet Commonly known as: Invokana 1 tablet before breakfast   Insulin Pen Needle 31G X 8 MM Misc Commonly known as: B-D ULTRAFINE III SHORT PEN USE DAILY AS DIRECTED   levonorgestrel 20 MCG/24HR IUD Commonly known as: MIRENA 1 each by Intrauterine route once.   lisinopril 20 MG tablet Commonly known as: ZESTRIL TAKE 1 TABLET BY MOUTH DAILY   magnesium citrate Soln Take 1 Bottle by mouth daily.   multivitamin tablet Take 1 tablet by mouth daily.   OneTouch Ultra test strip Generic drug: glucose blood USE TO TEST BLOOD SUGAR THREE TIMES DAILY BEFORE MEALS AS DIRECTED   Ozempic (0.25 or 0.5 MG/DOSE) 2 MG/1.5ML Sopn Generic drug: Semaglutide(0.25 or 0.5MG /DOS) Inject 0.25 mg into the skin once a week. Inject 0.25mg  under the skin once weekly for two weeks. Increase to 0.5mg  on week 3. What changed:   how much to take  additional instructions   sertraline 25 MG tablet Commonly known as: ZOLOFT Take 25 mg by mouth daily.   Toujeo Max SoloStar 300 UNIT/ML Sopn Generic drug: Insulin Glargine (2 Unit Dial) Inject 84 Units into the skin daily. Inject 84 units under the skin once daily.   valACYclovir 500 MG tablet Commonly known as: VALTREX Take 1 tablet (500 mg total) by mouth daily.   VITAMIN D (CHOLECALCIFEROL) PO Take by mouth.       Allergies:  No Active Allergies  Past Medical History:  Diagnosis Date  . Alcohol use   . Anxiety   . Bipolar disorder ([place])   . Depression   . Gestational diabetes    borderline II  . Osteoarthritis    knee  . Tobacco abuse     Past Surgical History:  Procedure Laterality Date  . CHOLECYSTECTOMY  2003    Family History  Problem Relation Age of Onset  . Hypertension Mother   . Hyperlipidemia Mother   . Alcohol abuse Father     Social History:  reports that she has been  smoking cigarettes. She has never used smokeless tobacco. She reports current alcohol use. She reports that she does not use drugs.   Review of Systems   Lipid history: On adequate treatment by PCP with Lipitor 40 mg    Lab Results  Component Value Date   CHOL 136 10/01/2018   HDL 37 (L) 10/01/2018   LDLCALC 80 10/01/2018   TRIG 107 10/01/2018   CHOLHDL 3.7 10/01/2018  Hypertension: Treated with lisinopril 20 mg by PCP  BP Readings from Last 3 Encounters:  04/01/19 (!) 155/89  01/28/19 122/70  12/03/18 140/78    Most recent eye exam was in 12/2018, no retinopathy  Most recent foot exam: 10/2017   LABS:  Lab on 04/27/2019  Component Date Value Ref Range Status  . Sodium 04/27/2019 136  135 - 145 mEq/L Final  . Potassium 04/27/2019 4.1  3.5 - 5.1 mEq/L Final  . Chloride 04/27/2019 102  96 - 112 mEq/L Final  . CO2 04/27/2019 25  19 - 32 mEq/L Final  . Glucose, Bld 04/27/2019 130* 70 - 99 mg/dL Final  . BUN 04/27/2019 10  6 - 23 mg/dL Final  . Creatinine, Ser 04/27/2019 0.82  0.40 - 1.20 mg/dL Final  . Calcium 04/27/2019 9.6  8.4 - 10.5 mg/dL Final  . GFR 04/27/2019 89.99  >60.00 mL/min Final  . Hgb A1c MFr Bld 04/27/2019 7.7* 4.6 - 6.5 % Final   Glycemic Control Guidelines for People with Diabetes:Non Diabetic:  <6%Goal of Therapy: <7%Additional Action Suggested:  >8%     Physical Examination:  There were no vitals taken for this visit.         ASSESSMENT:  Diabetes type 2, BMI 38  See history of present illness for detailed discussion of current diabetes management, blood sugar patterns and problems identified   Her A1c is 7.7  Her blood sugars are much higher than expected for her A1c Lab glucose was 130 fasting but at home they are generally higher Not clear if her meter is accurate as it is relatively old and she has nonfasting blood sugars consistently over 200 when checked  Her diet appears to be relatively high in fat and some sweets like  gummy bears. She has so far tolerated Invokana with only 1 yeast infection Not clear if she is benefiting from [place] but appears to be more insulin deficient  However she is supposed to be scheduled for bariatric surgery later this month   PLAN:   Start using the One Touch Verio monitor instead of ultra 2 Monitor blood sugars after meals Today discussed in detail the need for mealtime insulin to cover postprandial spikes, action of mealtime insulin, use of the insulin pen, timing and action of the rapid acting insulin as well as starting dose and dosage titration to target the two-hour reading of under 180  Humalog 10 units before each meal, if blood sugars are still over 200 after meals she will go up to at least 14 units She will take this with her main meals and only twice a day if she is eating 2 meals a day No change in other medications or Toujeo as yet Call if blood sugars do not improve  Regular exercise Cut back on high fat foods She will start using sugar-free candy instead of regular She will let us know when she is scheduled for bariatric surgery otherwise follow-up in 1 month  There are no Patient Instructions on file for this visit.      Elayne Snare 04/29/2019, 1:37 PM   Note: This office note was prepared with Dragon voice recognition system technology. Any transcriptional errors that result from this process are unintentional.

## 2019-04-30 LAB — HM MAMMOGRAPHY

## 2019-05-04 ENCOUNTER — Other Ambulatory Visit: Payer: Self-pay | Admitting: Endocrinology

## 2019-05-15 ENCOUNTER — Ambulatory Visit: Payer: Managed Care, Other (non HMO) | Admitting: Family

## 2019-05-27 ENCOUNTER — Other Ambulatory Visit: Payer: Self-pay

## 2019-05-27 MED ORDER — BD PEN NEEDLE SHORT U/F 31G X 8 MM MISC: 3 refills | Status: DC

## 2019-05-31 ENCOUNTER — Other Ambulatory Visit: Payer: Self-pay | Admitting: Endocrinology

## 2019-06-01 ENCOUNTER — Telehealth: Payer: Self-pay

## 2019-06-01 NOTE — Telephone Encounter (Signed)
PA initiated via [email or web address] for ozempic 0.5mg  once weekly.  Eduard Clos (Key: [id number]) Rx #: [id number] Ozempic (0.25 or 0.5 MG/DOSE) 2MG /1.5ML pen-injectors   Form [employer] Electronic Prior Authorization Form (2017 NCPDP) Created 8 hours ago Sent to Plan 2 minutes ago Plan Response 2 minutes ago Submit Clinical Questions less than a minute ago Determination Wait for Determination Please wait for [employer] 2017 NCPDP to return a determination.

## 2019-06-09 ENCOUNTER — Telehealth: Payer: Self-pay

## 2019-06-09 ENCOUNTER — Other Ambulatory Visit: Payer: Self-pay

## 2019-06-09 MED ORDER — OZEMPIC (0.25 OR 0.5 MG/DOSE) 2 MG/1.5ML ~~LOC~~ SOPN: 0.5000 mg | PEN_INJECTOR | SUBCUTANEOUS | 2 refills | Status: DC

## 2019-06-09 MED ORDER — TOUJEO MAX SOLOSTAR 300 UNIT/ML ~~LOC~~ SOPN: 84.0000 [IU] | PEN_INJECTOR | Freq: Every day | SUBCUTANEOUS | 2 refills | Status: DC

## 2019-06-09 NOTE — Telephone Encounter (Signed)
MEDICATION: TOUJEO MAX SOLOSTAR 300 UNIT/ML SOPN  Semaglutide,0.25 or 0.5MG /DOS, (OZEMPIC, 0.25 OR 0.5 MG/DOSE,) 2 MG/1.5ML SOPN  PHARMACY:      [place], [place] - [address] AT [place] [address]      IS THIS A 90 DAY SUPPLY : yes Per pharmacy   IS PATIENT OUT OF MEDICATION: yes   IF NOT; HOW MUCH IS LEFT:   LAST APPOINTMENT DATE: @11 /03/2019  NEXT APPOINTMENT DATE:@Visit  date not found  DO WE HAVE YOUR PERMISSION TO LEAVE A DETAILED MESSAGE:  OTHER COMMENTS:    **Let patient know to contact pharmacy at the end of the day to make sure medication is ready. **  ** Please notify patient to allow 48-72 hours to process**  **Encourage patient to contact the pharmacy for refills or they can request refills through [facility]** +

## 2019-06-09 NOTE — Telephone Encounter (Signed)
Rx sent 

## 2019-06-22 NOTE — Patient Instructions (Addendum)
DUE TO COVID-19 ONLY ONE VISITOR IS ALLOWED TO COME WITH YOU AND STAY IN THE WAITING ROOM ONLY DURING PRE OP AND PROCEDURE DAY OF SURGERY. THE 1 VISITOR MAY VISIT WITH YOU AFTER SURGERY IN YOUR PRIVATE ROOM DURING VISITING HOURS ONLY!  YOU NEED TO HAVE A COVID 19 TEST ON___12-3___ @_______ , THIS TEST MUST BE DONE BEFORE SURGERY, COME  [place] , [id number].  ([place]) ONCE YOUR COVID TEST IS COMPLETED, PLEASE BEGIN THE QUARANTINE INSTRUCTIONS AS OUTLINED IN YOUR HANDOUT.                Tanya Wilcox    Your procedure is scheduled on: 12-7   Report to [place]  Entrance   Report to [place] at 5:30AM     Call this number if you have problems the morning of surgery [phone]    NO SOLID FOOD AFTER 6:00 PM THE NIGHT BEFORE YOUR SURGERY. YOU MAY DRINK CLEAR FLUIDS.  MORNING OF SURGERY DRINK:   DRINK 1 G2 drink BEFORE YOU LEAVE HOME, DRINK ALL OF THE  G2 DRINK AT ONE TIME.   THE G2 DRINK YOU DRINK BEFORE YOU LEAVE HOME WILL BE THE LAST FLUIDS YOU DRINK BEFORE SURGERY.     CLEAR LIQUID DIET   Foods Allowed                                                                     Foods Excluded  Coffee and tea, regular and decaf                             liquids that you cannot  Plain Jell-O any favor except red or purple                                           see through such as: Fruit ices (not with fruit pulp)                                     milk, soups, orange juice  Iced Popsicles                                    All solid food                                   Cranberry, grape and apple juices Sports drinks like Gatorade Lightly seasoned clear broth or consume(fat free) Sugar, honey syrup  Sample Menu Breakfast                                Lunch  Supper Cranberry juice                    Beef broth                            Chicken broth Jell-O                                      Grape juice                           Apple juice Coffee or tea                        Jell-O                                      Popsicle                                                Coffee or tea                        Coffee or tea  _____________________________________________________________________       BRUSH YOUR TEETH MORNING OF SURGERY AND RINSE YOUR MOUTH OUT, NO CHEWING GUM CANDY OR MINTS.     Take these medicines the morning of surgery with A SIP OF WATER: XANAX IF NEEDED, ABILIFY, ATORVASTATIN, SERTRALINE, VALACYCLOVIR    How to Manage Your Diabetes Before and After Surgery  Why is it important to control my blood sugar before and after surgery? . Improving blood sugar levels before and after surgery helps healing and can limit problems. . A way of improving blood sugar control is eating a healthy diet by: o  Eating less sugar and carbohydrates o  Increasing activity/exercise o  Talking with your doctor about reaching your blood sugar goals . High blood sugars (greater than 180 mg/dL) can raise your risk of infections and slow your recovery, so you will need to focus on controlling your diabetes during the weeks before surgery. . Make sure that the doctor who takes care of your diabetes knows about your planned surgery including the date and location.  How do I manage my blood sugar before surgery? . Check your blood sugar at least 4 times a day, starting 2 days before surgery, to make sure that the level is not too high or low. o Check your blood sugar the morning of your surgery when you wake up and every 2 hours until you get to the Short Stay unit. . If your blood sugar is less than 70 mg/dL, you will need to treat for low blood sugar: o Do not take insulin. o Treat a low blood sugar (less than 70 mg/dL) with  cup of clear juice (cranberry or apple), 4 glucose tablets, OR glucose gel. o Recheck blood sugar in 15 minutes after treatment (to make sure it is  greater than 70 mg/dL). If your blood sugar is not greater than 70 mg/dL on recheck, call [phone] for further instructions. . Report your blood sugar to the short  stay nurse when you get to Short Stay.  . If you are admitted to the hospital after surgery: o Your blood sugar will be checked by the staff and you will probably be given insulin after surgery (instead of oral diabetes medicines) to make sure you have good blood sugar levels. o The goal for blood sugar control after surgery is 80-180 mg/dL.   WHAT DO I DO ABOUT MY DIABETES MEDICATION?   . THE DAY BEFORE SURGERY 12-6  o HUMALOG INSULIN: TAKE AS USUAL      . THE MORNING OF SURGERY 12-7  o HUMALOG INSULIN: If your blood sugar is greater than 220 , you may take HALF of your sliding scale (correction) dose of insulin     Reviewed and Endorsed by [facility] Patient Education Committee, August 2015                                 You may not have any metal on your body including hair pins and              piercings  Do not wear jewelry, make-up, lotions, powders or perfumes, deodorant             Do not wear nail polish on your fingernails.  Do not shave  48 hours prior to surgery.     Do not bring valuables to the hospital. [place].  Contacts, dentures or bridgework may not be worn into surgery.  YOU MAY BRING A SMALL OVERNIGHT BAG               Please read over the following fact sheets you were given: _____________________________________________________________________             [facility] - Preparing for Surgery Before surgery, you can play an important role.  Because skin is not sterile, your skin needs to be as free of germs as possible.  You can reduce the number of germs on your skin by washing with CHG (chlorahexidine gluconate) soap before surgery.  CHG is an antiseptic cleaner which kills germs and bonds with the skin to continue killing germs  even after washing. Please DO NOT use if you have an allergy to CHG or antibacterial soaps.  If your skin becomes reddened/irritated stop using the CHG and inform your nurse when you arrive at Short Stay. Do not shave (including legs and underarms) for at least 48 hours prior to the first CHG shower.  You may shave your face/neck. Please follow these instructions carefully:  1.  Shower with CHG Soap the night before surgery and the  morning of Surgery.  2.  If you choose to wash your hair, wash your hair first as usual with your  normal  shampoo.  3.  After you shampoo, rinse your hair and body thoroughly to remove the  shampoo.                           4.  Use CHG as you would any other liquid soap.  You can apply chg directly  to the skin and wash                       Gently with a scrungie or clean washcloth.  5.  Apply the CHG Soap to your body ONLY FROM THE NECK DOWN.   Do not use on face/ open                           Wound or open sores. Avoid contact with eyes, ears mouth and genitals (private parts).                       Wash face,  Genitals (private parts) with your normal soap.             6.  Wash thoroughly, paying special attention to the area where your surgery  will be performed.  7.  Thoroughly rinse your body with warm water from the neck down.  8.  DO NOT shower/wash with your normal soap after using and rinsing off  the CHG Soap.                9.  Pat yourself dry with a clean towel.            10.  Wear clean pajamas.            11.  Place clean sheets on your bed the night of your first shower and do not  sleep with pets. Day of Surgery : Do not apply any lotions/deodorants the morning of surgery.  Please wear clean clothes to the hospital/surgery center.  FAILURE TO FOLLOW THESE INSTRUCTIONS MAY RESULT IN THE CANCELLATION OF YOUR SURGERY PATIENT SIGNATURE_________________________________  NURSE  SIGNATURE__________________________________  ________________________________________________________________________    PAIN IS EXPECTED AFTER SURGERY AND WILL NOT BE COMPLETELY ELIMINATED. AMBULATION AND TYLENOL WILL HELP REDUCE INCISIONAL AND GAS PAIN. MOVEMENT IS KEY!  YOU ARE EXPECTED TO BE OUT OF BED WITHIN 4 HOURS OF ADMISSION TO YOUR PATIENT ROOM.  SITTING IN THE RECLINER THROUGHOUT THE DAY IS IMPORTANT FOR DRINKING FLUIDS AND MOVING GAS THROUGHOUT THE GI TRACT.  COMPRESSION STOCKINGS SHOULD BE WORN [place] UNLESS YOU ARE WALKING.   INCENTIVE SPIROMETER SHOULD BE USED EVERY HOUR WHILE AWAKE TO DECREASE POST-OPERATIVE COMPLICATIONS SUCH AS PNEUMONIA.  WHEN DISCHARGED HOME, IT IS IMPORTANT TO CONTINUE TO WALK EVERY HOUR AND USE THE INCENTIVE SPIROMETER EVERY HOUR.       Incentive Spirometer  An incentive spirometer is a tool that can help keep your lungs clear and active. This tool measures how well you are filling your lungs with each breath. Taking long deep breaths may help reverse or decrease the chance of developing breathing (pulmonary) problems (especially infection) following:  A long period of time when you are unable to move or be active. BEFORE THE PROCEDURE   If the spirometer includes an indicator to show your best effort, your nurse or respiratory therapist will set it to a desired goal.  If possible, sit up straight or lean slightly forward. Try not to slouch.  Hold the incentive spirometer in an upright position. INSTRUCTIONS FOR USE  1. Sit on the edge of your bed if possible, or sit up as far as you can in bed or on a chair. 2. Hold the incentive spirometer in an upright position. 3. Breathe out normally. 4. Place the mouthpiece in your mouth and seal your lips tightly around it. 5. Breathe in slowly and as deeply as possible, raising the piston or the ball toward the top of the column. 6. Hold your breath for 3-5 seconds or for as  long as possible. Allow  the piston or ball to fall to the bottom of the column. 7. Remove the mouthpiece from your mouth and breathe out normally. 8. Rest for a few seconds and repeat Steps 1 through 7 at least 10 times every 1-2 hours when you are awake. Take your time and take a few normal breaths between deep breaths. 9. The spirometer may include an indicator to show your best effort. Use the indicator as a goal to work toward during each repetition. 10. After each set of 10 deep breaths, practice coughing to be sure your lungs are clear. If you have an incision (the cut made at the time of surgery), support your incision when coughing by placing a pillow or rolled up towels firmly against it. Once you are able to get out of bed, walk around indoors and cough well. You may stop using the incentive spirometer when instructed by your caregiver.  RISKS AND COMPLICATIONS  Take your time so you do not get dizzy or light-headed.  If you are in pain, you may need to take or ask for pain medication before doing incentive spirometry. It is harder to take a deep breath if you are having pain. AFTER USE  Rest and breathe slowly and easily.  It can be helpful to keep track of a log of your progress. Your caregiver can provide you with a simple table to help with this. If you are using the spirometer at home, follow these instructions: [place] IF:   You are having difficultly using the spirometer.  You have trouble using the spirometer as often as instructed.  Your pain medication is not giving enough relief while using the spirometer.  You develop fever of 100.5 F (38.1 C) or higher. SEEK IMMEDIATE MEDICAL CARE IF:   You cough up bloody sputum that had not been present before.  You develop fever of 102 F (38.9 C) or greater.  You develop worsening pain at or near the incision site. MAKE SURE YOU:   Understand these instructions.  Will watch your condition.  Will get help  right away if you are not doing well or get worse. Document Released: 11/19/2006 Document Revised: 10/01/2011 Document Reviewed: 01/20/2007 [facility] Patient Information 2014 [place], [place].   ________________________________________________________________________

## 2019-06-22 NOTE — Progress Notes (Signed)
Dr Lucia Gaskins, Please place orders in epic for upcoming surgery . Pre-op appt is 12-3. TY

## 2019-06-23 ENCOUNTER — Other Ambulatory Visit: Payer: Self-pay | Admitting: Surgery

## 2019-06-25 ENCOUNTER — Other Ambulatory Visit: Payer: Self-pay

## 2019-06-25 ENCOUNTER — Encounter (HOSPITAL_COMMUNITY)
Admission: RE | Admit: 2019-06-25 | Discharge: 2019-06-25 | Disposition: A | Discharge status: Home / Self Care | Payer: Managed Care, Other (non HMO) | Source: Ambulatory Visit | Attending: Surgery | Admitting: Surgery

## 2019-06-25 ENCOUNTER — Telehealth: Payer: Self-pay | Admitting: Endocrinology

## 2019-06-25 ENCOUNTER — Other Ambulatory Visit (HOSPITAL_COMMUNITY)
Admission: RE | Admit: 2019-06-25 | Discharge: 2019-06-25 | Disposition: A | Discharge status: Home / Self Care | Payer: Managed Care, Other (non HMO) | Source: Ambulatory Visit | Attending: Orthopaedic Surgery | Admitting: Orthopaedic Surgery

## 2019-06-25 ENCOUNTER — Encounter (HOSPITAL_COMMUNITY): Payer: Self-pay

## 2019-06-25 DIAGNOSIS — Z20822 Contact with and (suspected) exposure to covid-19: Secondary | ICD-10-CM

## 2019-06-25 DIAGNOSIS — Z6837 Body mass index (BMI) 37.0-37.9, adult: Secondary | ICD-10-CM | POA: Insufficient documentation

## 2019-06-25 DIAGNOSIS — E785 Hyperlipidemia, unspecified: Secondary | ICD-10-CM | POA: Insufficient documentation

## 2019-06-25 DIAGNOSIS — I10 Essential (primary) hypertension: Secondary | ICD-10-CM | POA: Diagnosis not present

## 2019-06-25 DIAGNOSIS — R9431 Abnormal electrocardiogram [ECG] [EKG]: Secondary | ICD-10-CM | POA: Diagnosis not present

## 2019-06-25 DIAGNOSIS — Z01818 Encounter for other preprocedural examination: Secondary | ICD-10-CM | POA: Diagnosis not present

## 2019-06-25 DIAGNOSIS — Z20828 Contact with and (suspected) exposure to other viral communicable diseases: Secondary | ICD-10-CM | POA: Insufficient documentation

## 2019-06-25 DIAGNOSIS — E119 Type 2 diabetes mellitus without complications: Secondary | ICD-10-CM | POA: Insufficient documentation

## 2019-06-25 HISTORY — DX: Type 2 diabetes mellitus without complications: E11.9

## 2019-06-25 LAB — COMPREHENSIVE METABOLIC PANEL
ALT: 24 U/L (ref 0–44)
AST: 15 U/L (ref 15–41)
Albumin: 4.5 g/dL (ref 3.5–5.0)
Alkaline Phosphatase: 61 U/L (ref 38–126)
Anion gap: 10 (ref 5–15)
BUN: 17 mg/dL (ref 6–20)
CO2: 25 mmol/L (ref 22–32)
Calcium: 9.6 mg/dL (ref 8.9–10.3)
Chloride: 100 mmol/L (ref 98–111)
Creatinine, Ser: 0.78 mg/dL (ref 0.44–1.00)
GFR calc Af Amer: >60 mL/min (ref >60)
GFR calc non Af Amer: >60 mL/min (ref >60)
Glucose, Bld: 197 mg/dL — ABNORMAL HIGH (ref 70–99)
Potassium: 4.2 mmol/L (ref 3.5–5.1)
Sodium: 135 mmol/L (ref 135–145)
Total Bilirubin: 0.9 mg/dL (ref 0.3–1.2)
Total Protein: 7.8 g/dL (ref 6.5–8.1)

## 2019-06-25 LAB — CBC WITH DIFFERENTIAL/PLATELET
Abs Immature Granulocytes: 0.01 10*3/uL (ref 0.00–0.07)
Basophils Absolute: 0 10*3/uL (ref 0.0–0.1)
Basophils Relative: 0 %
Eosinophils Absolute: 0 10*3/uL (ref 0.0–0.5)
Eosinophils Relative: 1 %
HCT: 41.5 % (ref 36.0–46.0)
Hemoglobin: 13.9 g/dL (ref 12.0–15.0)
Immature Granulocytes: 0 %
Lymphocytes Relative: 36 %
Lymphs Abs: 2.5 10*3/uL (ref 0.7–4.0)
MCH: 32.9 pg (ref 26.0–34.0)
MCHC: 33.5 g/dL (ref 30.0–36.0)
MCV: 98.3 fL (ref 80.0–100.0)
Monocytes Absolute: 0.6 10*3/uL (ref 0.1–1.0)
Monocytes Relative: 9 %
Neutro Abs: 3.7 10*3/uL (ref 1.7–7.7)
Neutrophils Relative %: 54 %
Platelets: 376 10*3/uL (ref 150–400)
RBC: 4.22 MIL/uL (ref 3.87–5.11)
RDW: 11.7 % (ref 11.5–15.5)
WBC: 6.9 10*3/uL (ref 4.0–10.5)
nRBC: 0 % (ref 0.0–0.2)

## 2019-06-25 LAB — HEMOGLOBIN A1C
Hgb A1c MFr Bld: 6.7 % — ABNORMAL HIGH (ref 4.8–5.6)
Mean Plasma Glucose: 145.59 mg/dL

## 2019-06-25 LAB — ABO/RH: ABO/RH(D): AB POS

## 2019-06-25 LAB — GLUCOSE, CAPILLARY: Glucose-Capillary: 201 mg/dL — ABNORMAL HIGH (ref 70–99)

## 2019-06-25 NOTE — Telephone Encounter (Signed)
Patient requests to be called at ph# [phone] re: Patient is having Gastric Bypass surgery on 06/29/19 and would like to discuss medication

## 2019-06-25 NOTE — Progress Notes (Signed)
PCP - Debbrah Alar, NP Endocrinology : Dr Elayne Snare , lov 05-09-2019 Cardiologist -   Chest x-ray - 07-25-2018 epic  EKG - 06-25-2019 e[ic  Stress Test -  ECHO - 2017 epic  Cardiac Cath -   Sleep Study -  CPAP -   Fasting Blood Sugar -  160s at home  Checks Blood Sugar __3-4___ times a day  Blood Thinner Instructions: Aspirin Instructions: Last Dose:  Anesthesia review:   PATIENT REPORTS TODAY SHE HAS BEEN OFF OZEMPIC AND TUOJEO FOR CLOSE  TO 3 WEEKS DUE TO INSURANCE ISSUES. SHE NORMALLY REFILLS A 90 DAY SUPPLY OF EACH MEDICATION BUT HAS BEEN UNABLE TO GET IT . WHEN HER BARI SURGERY WAS SCHEDULED SHE DECIDED NOT TO FILL THE OZEMPIC AND TUOJEO  AS SHE REASONED SHE WOULD  NOT NEED THAT MUCH AFTER SURGERY . SHE HAS NOT TOLD HER ENDOCRINOLOGIST THAT SHE HAS STOPPED THESE MEDICATIONS. RN  STRONGLY ADVISED PATIENT TO CONTACT HER ENDOCRINOLOGIST DR. Dwyane Dee BEFORE HER SURGERY TO UPDATE HIM ABOUT HER INDEPENDENT CESSATION OF THESES MEDICATIONS. PATIENT VERBALIZED UNDERSTANDING.     Patient denies shortness of breath, fever, cough and chest pain at PAT appointment   Patient verbalized understanding of instructions that were given to them at the PAT appointment. Patient was also instructed that they will need to review over the PAT instructions again at home before surgery.

## 2019-06-25 NOTE — Telephone Encounter (Signed)
If she has any Humalog she can take 10-15 units before each meal.  Otherwise if she still has 100 mg of Invokana she can start taking this twice a day until surgery on Monday

## 2019-06-25 NOTE — Telephone Encounter (Signed)
Called pt and gave her MD message. Pt verbalized understanding. 

## 2019-06-25 NOTE — Telephone Encounter (Signed)
Pt questioned again if she should take 10-15 units before each meal because she is on a pre-op diet. Her diet consists of a protein shake for breakfast, and at lunch and dinner, she has salad of mixed greens, cucumber, tomoato, 3 oz of meat and 2 tbsp sugar free, fat free dressing. She may also have up to 2 sugar free jello cups per day. Also does not have an invokana left but she does have humalog.

## 2019-06-25 NOTE — Telephone Encounter (Signed)
Pt stated that she has been out of Ozempic and Toujeo for two weeks as of today. Blood sugars have been running 140-200 consistently. Pt would like to know if she needs to resume taking these medications before her gastric bypass surgery.

## 2019-06-25 NOTE — Telephone Encounter (Signed)
It may be best that she get the Invokana refilled for at least 10-day supply and take this twice a day

## 2019-06-26 ENCOUNTER — Other Ambulatory Visit (HOSPITAL_COMMUNITY)
Admission: RE | Admit: 2019-06-26 | Discharge: 2019-06-26 | Disposition: A | Discharge status: Home / Self Care | Payer: Managed Care, Other (non HMO) | Source: Ambulatory Visit | Attending: Surgery | Admitting: Surgery

## 2019-06-26 DIAGNOSIS — Z01812 Encounter for preprocedural laboratory examination: Secondary | ICD-10-CM | POA: Insufficient documentation

## 2019-06-26 DIAGNOSIS — Z20828 Contact with and (suspected) exposure to other viral communicable diseases: Secondary | ICD-10-CM | POA: Insufficient documentation

## 2019-06-26 LAB — SARS CORONAVIRUS 2 (TAT 6-24 HRS): SARS Coronavirus 2: NEGATIVE

## 2019-06-26 LAB — NOVEL CORONAVIRUS, NAA: SARS-CoV-2, NAA: NOT DETECTED

## 2019-06-26 NOTE — Progress Notes (Signed)
covid test still active in epic. RN spoke with patient yesterday and patient reports she did get her covid test done yesterday . This morning , RN spoke with covid testing nurse Lilia Pro who confirmed that patient went through community testing line and patient covid result may not be back until Monday; if test had not resulted by Sunday there would be no option for patient to have another Novel covid test ;  however patient had option to return today and have novel covid test done in pre-surgical testing line that would be resulted in 48 hours. RN spoke with patient this morning to relay covid nurse information . Patient voices she would prefer to be retested today in the correct line. RN called covid test nurse Lilia Pro back to make her aware patient has decided to be retested today , Lilia Pro verbalized understanding. covid test appt created in epic for today .

## 2019-06-28 MED ORDER — BUPIVACAINE LIPOSOME 1.3 % IJ SUSP
20.0000 mL | Freq: Once | INTRAMUSCULAR | Status: DC
  Filled 2019-06-28: qty 20

## 2019-06-28 NOTE — Anesthesia Preprocedure Evaluation (Addendum)
Anesthesia Evaluation  Patient identified by MRN, date of birth, ID band Patient awake    Reviewed: Allergy & Precautions, NPO status , Patient's Chart, lab work & pertinent test results  Airway Mallampati: II  TM Distance: >3 FB Neck ROM: Full    Dental no notable dental hx.    Pulmonary former smoker,    Pulmonary exam normal breath sounds clear to auscultation       Cardiovascular hypertension, Pt. on medications Normal cardiovascular exam Rhythm:Regular Rate:Normal  ECG: NSR, rate 71   Neuro/Psych  Headaches, PSYCHIATRIC DISORDERS Anxiety Depression Bipolar Disorder    GI/Hepatic Neg liver ROS,   Endo/Other  diabetes, Insulin Dependent  Renal/GU negative Renal ROS     Musculoskeletal negative musculoskeletal ROS (+)   Abdominal (+) + obese,   Peds  Hematology HLD   Anesthesia Other Findings Morbid Obesity, DM II, HTN, Dyslipiemia  Reproductive/Obstetrics hcg negative                            Anesthesia Physical Anesthesia Plan  ASA: III  Anesthesia Plan: General   Post-op Pain Management:    Induction: Intravenous  PONV Risk Score and Plan: 4 or greater and Scopolamine patch - Pre-op, Midazolam, Dexamethasone, Ondansetron and Treatment may vary due to age or medical condition  Airway Management Planned: Oral ETT  Additional Equipment:   Intra-op Plan:   Post-operative Plan: Extubation in OR  Informed Consent: I have reviewed the patients History and Physical, chart, labs and discussed the procedure including the risks, benefits and alternatives for the proposed anesthesia with the patient or authorized representative who has indicated his/her understanding and acceptance.     Dental advisory given  Plan Discussed with: CRNA  Anesthesia Plan Comments:        Anesthesia Quick Evaluation

## 2019-06-28 NOTE — H&P (Signed)
Tanya Wilcox  Location: [facility] Patient #: [id number] DOB: 09-12-1970 Single / Language: Cleophus Molt / Race: Black or African American Female  History of Present Illness   The patient is a 48 year old female who presents with a complaint of weight loss surgery.  The PCP is Debbrah Alar, NP. ( at [facility])  She comes by herself.  [The Covid-19 virus has disrupted normal medical care in [place] and across the nation. We have sometimes had to alter normal surgical/medical care to limit this epidemic and we have explained these changes to the patient.]  She saw Dr. Excell Seltzer on 30 May 2018. She was interested in a gastric bypass. So she has completed much of the workup. She has tried several diets through her life. She has tried Weight Watchers, green smoothi, keto diet, calorie counting with My Fitness Pal App. She also tried [facility] [place] and took Phentermine for a while, but it made her constipated and she stopped.  She has seen Alvester Morin for nutrition. She had an UGI - 07/25/2018 - normal stomach  Per the [facility] [place], the patient is a candidate for bariatric surgery. The patient attended an information session and reviewed the types of bariatric surgery.  The patient is interested in the Roux en Y Gastric Bypass. I discussed with the patient the indications and risks of bariatric surgery. The potential risks of surgery include, but are not limited to, bleeding, infection, leak from the bowel, DVT and PE, open surgery, long term nutrition consequences, and death. The patient understands the importance of compliance and long term follow-up with our group after surgery. She has done almost everything needed for her surgery. The big hurdle is her smoking. She smokes 10-15 cigarettes per day. I told her that I want her quit smoking for at least 8 weeks before surgery. I also discussed the  risks of smoking prior to surgery and the risks postop, frequently people resume smoking and how much of a risk smoking carries with weight loss surgery. She has heard the smoking lecture from several angles.  Review of Systems as stated in this history (HPI) or in the review of systems. Otherwise all other 12 point ROS are negative  Past Medical History: 1. Smokes about 1/2 ppd She knows that she needs to quit at least 8 weeks before surgery 2. DM - Sees Dr. Dwyane Dee Insulin dependent HgbA1C - 7.0 - 01/26/2019  3. Chronic knee pain 4. HTN 5. Dyslipidemia 6. She saw Myrtie Cruise for psych ([place]) 7. Bipolar disease She sees Special educational needs teacher and the [employer] She is about to see a new therapist - ? name 8. Lap chole - 2001  Social History: Not married. Has 35 yo daughter (who just quit smoking) She works as a Careers information officer at [employer] ([place], [place]; 03/12/2019 2:54 PM) Anastasio Auerbach *ANTIDIABETICS*  Allergies Reconciled   Medication History Nance Pew, [place]; 03/12/2019 2:54 PM) ALPRAZolam (0.5MG  Tablet, Oral) Active. BD Pen Needle Short U/F (31G X 8 MM Misc,) Active. Furosemide (20MG  Tablet, Oral) Active. Lisinopril (20MG  Tablet, Oral) Active. metFORMIN HCl ER (500MG  Tablet ER 24HR, Oral) Active. metroNIDAZOLE (500MG  Tablet, Oral) Active. Sertraline HCl (25MG  Tablet, Oral) Active. Toujeo Max SoloStar (300UNIT/ML Soln Pen-inj, Subcutaneous) Active. Trulicity (1.5MG /0.5ML Soln Pen-inj, Subcutaneous) Active. Mirena (52 MG) (20MCG/24HR IUD, Intrauterine) Active. Valtrex (500MG  Tablet, Oral as needed) Active. Lipitor (20MG  Tablet, Oral) Active. Aspirin (81MG  Tablet DR, Oral) Active. Nicotine Step 1 (  21MG /24HR Patch 24HR, Transdermal) Active. Multi-Vitamin (Oral) Active. Insulin Lispro (100UNIT/ML Solution, Subcutaneous) Active. Atorvastatin Calcium (40MG  Tablet, Oral)  Active. ARIPiprazole (5MG  Tablet, Oral) Active. Medications Reconciled  Vitals (Sabrina Canty CMA; 03/12/2019 2:55 PM) 03/12/2019 2:54 PM Weight: 241.4 lb Height: 67in Body Surface Area: 2.19 m Body Mass Index: 37.81 kg/m  Temp.: 99.6F(Temporal)  Pulse: 100 (Regular)  Resp.: 16 (Unlabored)  P.OX: 99% (Room air) BP: 126/82 (Sitting, Left Arm, Standard)  Physical exam:  General: Alert, obese African-American female, in no distress Skin: Warm and dry without rash or infection. HEENT: No palpable masses or thyromegaly. Sclera nonicteric. Pupils equal round and reactive. Oropharynx clear.  Lymph nodes: No cervical, supraclavicular, or inguinal nodes palpable. Lungs: Mild bilateral expiratory wheezing. No increased work of breathing. Cardiovascular: Regular rate and rhythm without murmer. No JVD or edema. Peripheral pulses intact. No carotid bruits. Abdomen: Nondistended. Soft and nontender. No masses palpable. No organomegaly. No palpable hernias. Well-healed laparoscopic incisions. Extremities: No edema or joint swelling or deformity. No chronic venous stasis changes. Neurologic: Alert and fully oriented. Gait normal. No focal weakness. Psychiatric: Normal mood and affect. Thought content appropriate with normal judgement and insight  Assessment & Plan  1.  MORBID OBESITY, UNSPECIFIED OBESITY TYPE (E66.01)  Plan:  1. Quit smoking  Addendum Note(Leonidas Boateng H. Lucia Gaskins MD; 05/09/2019 3:02 PM) nicotine screen was negative   2. Consent for surgery  3. For laparoscopic gastric bypass  2.  DIABETES MELLITUS, INSULIN DEPENDENT (IDDM), CONTROLLED (E11.9)  Sees Dr. Dwyane Dee  Insulin dependent,  HgbA1C - 7.0 - 01/26/2019  3.  ESSENTIAL HYPERTENSION (I10)  4.  Quit smoking 5. Chronic knee pain 6. Dyslipidemia 7. She saw Myrtie Cruise for psych ([place]) 8. Bipolar disease She sees Special educational needs teacher and the [employer] She is about to see a  new therapist - ? name  Alphonsa Overall, MD, [facility] Surgery Office phone:  [phone]

## 2019-06-29 ENCOUNTER — Inpatient Hospital Stay (HOSPITAL_COMMUNITY): Payer: Managed Care, Other (non HMO) | Admitting: Certified Registered Nurse Anesthetist

## 2019-06-29 ENCOUNTER — Encounter (HOSPITAL_COMMUNITY): Payer: Self-pay

## 2019-06-29 ENCOUNTER — Inpatient Hospital Stay (HOSPITAL_COMMUNITY)
Admission: RE | Admit: 2019-06-29 | Discharge: 2019-06-30 | DRG: 621 | Disposition: A | Discharge status: Home / Self Care | Payer: Managed Care, Other (non HMO) | Attending: Surgery | Admitting: Surgery

## 2019-06-29 ENCOUNTER — Other Ambulatory Visit: Payer: Self-pay

## 2019-06-29 ENCOUNTER — Encounter (HOSPITAL_COMMUNITY)
Admission: RE | Disposition: A | Discharge status: Home / Self Care | Payer: Self-pay | Source: Home / Self Care | Attending: Surgery

## 2019-06-29 DIAGNOSIS — Z6837 Body mass index (BMI) 37.0-37.9, adult: Secondary | ICD-10-CM | POA: Diagnosis not present

## 2019-06-29 DIAGNOSIS — M25569 Pain in unspecified knee: Secondary | ICD-10-CM | POA: Diagnosis present

## 2019-06-29 DIAGNOSIS — Z794 Long term (current) use of insulin: Secondary | ICD-10-CM | POA: Diagnosis not present

## 2019-06-29 DIAGNOSIS — E663 Overweight: Secondary | ICD-10-CM | POA: Diagnosis present

## 2019-06-29 DIAGNOSIS — F319 Bipolar disorder, unspecified: Secondary | ICD-10-CM | POA: Diagnosis present

## 2019-06-29 DIAGNOSIS — E119 Type 2 diabetes mellitus without complications: Secondary | ICD-10-CM | POA: Diagnosis present

## 2019-06-29 DIAGNOSIS — F17210 Nicotine dependence, cigarettes, uncomplicated: Secondary | ICD-10-CM | POA: Diagnosis present

## 2019-06-29 DIAGNOSIS — I10 Essential (primary) hypertension: Secondary | ICD-10-CM | POA: Diagnosis present

## 2019-06-29 DIAGNOSIS — Z20828 Contact with and (suspected) exposure to other viral communicable diseases: Secondary | ICD-10-CM | POA: Diagnosis present

## 2019-06-29 DIAGNOSIS — G8929 Other chronic pain: Secondary | ICD-10-CM | POA: Diagnosis present

## 2019-06-29 DIAGNOSIS — E669 Obesity, unspecified: Secondary | ICD-10-CM | POA: Diagnosis present

## 2019-06-29 DIAGNOSIS — E785 Hyperlipidemia, unspecified: Secondary | ICD-10-CM | POA: Diagnosis present

## 2019-06-29 HISTORY — PX: GASTRIC ROUX-EN-Y: SHX5262

## 2019-06-29 LAB — TYPE AND SCREEN
ABO/RH(D): AB POS
Antibody Screen: NEGATIVE

## 2019-06-29 LAB — GLUCOSE, CAPILLARY
Glucose-Capillary: 179 mg/dL — ABNORMAL HIGH (ref 70–99)
Glucose-Capillary: 209 mg/dL — ABNORMAL HIGH (ref 70–99)
Glucose-Capillary: 225 mg/dL — ABNORMAL HIGH (ref 70–99)
Glucose-Capillary: 200 mg/dL — ABNORMAL HIGH (ref 70–99)

## 2019-06-29 LAB — HEMOGLOBIN AND HEMATOCRIT, BLOOD
HCT: 39.7 % (ref 36.0–46.0)
Hemoglobin: 13.3 g/dL (ref 12.0–15.0)

## 2019-06-29 LAB — PREGNANCY, URINE: Preg Test, Ur: NEGATIVE

## 2019-06-29 SURGERY — LAPAROSCOPIC ROUX-EN-Y GASTRIC BYPASS WITH UPPER ENDOSCOPY
Anesthesia: General | Site: Abdomen

## 2019-06-29 MED ORDER — LIDOCAINE 20MG/ML (2%) 15 ML SYRINGE OPTIME
INTRAMUSCULAR | Status: DC | PRN
  Administered 2019-06-29: 1.5 mg/kg/h via INTRAVENOUS

## 2019-06-29 MED ORDER — CHLORHEXIDINE GLUCONATE 4 % EX LIQD: 60.0000 mL | Freq: Once | CUTANEOUS | Status: DC

## 2019-06-29 MED ORDER — PROPOFOL 10 MG/ML IV BOLUS
INTRAVENOUS | Status: AC
  Filled 2019-06-29: qty 20

## 2019-06-29 MED ORDER — OXYCODONE HCL 5 MG PO TABS: 5.0000 mg | ORAL_TABLET | Freq: Once | ORAL | Status: DC | PRN

## 2019-06-29 MED ORDER — ACETAMINOPHEN 500 MG PO TABS
1000.0000 mg | ORAL_TABLET | Freq: Three times a day (TID) | ORAL | Status: DC
  Administered 2019-06-29 – 2019-06-30 (×2): 1000 mg via ORAL
  Filled 2019-06-29 (×2): qty 2

## 2019-06-29 MED ORDER — ENSURE MAX PROTEIN PO LIQD
2.0000 [oz_av] | ORAL | Status: DC
  Administered 2019-06-30 (×2): 2 [oz_av] via ORAL
  Filled 2019-06-29 (×8): qty 330

## 2019-06-29 MED ORDER — KETOROLAC TROMETHAMINE 30 MG/ML IJ SOLN
INTRAMUSCULAR | Status: AC
  Filled 2019-06-29: qty 1

## 2019-06-29 MED ORDER — FENTANYL CITRATE (PF) 250 MCG/5ML IJ SOLN
INTRAMUSCULAR | Status: AC
  Filled 2019-06-29: qty 5

## 2019-06-29 MED ORDER — PROMETHAZINE HCL 25 MG/ML IJ SOLN: 6.2500 mg | INTRAMUSCULAR | Status: DC | PRN

## 2019-06-29 MED ORDER — BUPIVACAINE LIPOSOME 1.3 % IJ SUSP
INTRAMUSCULAR | Status: DC | PRN
  Administered 2019-06-29: 20 mL

## 2019-06-29 MED ORDER — FENTANYL CITRATE (PF) 100 MCG/2ML IJ SOLN
INTRAMUSCULAR | Status: AC
  Filled 2019-06-29: qty 2

## 2019-06-29 MED ORDER — PROPOFOL 10 MG/ML IV BOLUS
INTRAVENOUS | Status: DC | PRN
  Administered 2019-06-29: 200 mg via INTRAVENOUS

## 2019-06-29 MED ORDER — KCL IN DEXTROSE-NACL 20-5-0.45 MEQ/L-%-% IV SOLN
INTRAVENOUS | Status: DC
  Administered 2019-06-29: 13:00:00 via INTRAVENOUS
  Filled 2019-06-29 (×3): qty 1000

## 2019-06-29 MED ORDER — LIDOCAINE 2% (20 MG/ML) 5 ML SYRINGE
INTRAMUSCULAR | Status: AC
  Filled 2019-06-29: qty 5

## 2019-06-29 MED ORDER — OXYCODONE HCL 5 MG/5ML PO SOLN: 5.0000 mg | Freq: Four times a day (QID) | ORAL | Status: DC | PRN

## 2019-06-29 MED ORDER — BUPIVACAINE-EPINEPHRINE 0.25% -1:200000 IJ SOLN
INTRAMUSCULAR | Status: DC | PRN
  Administered 2019-06-29: 30 mL

## 2019-06-29 MED ORDER — ACETAMINOPHEN 500 MG PO TABS
1000.0000 mg | ORAL_TABLET | ORAL | Status: AC
  Administered 2019-06-29: 1000 mg via ORAL
  Filled 2019-06-29: qty 2

## 2019-06-29 MED ORDER — TISSEEL VH 10 ML EX KIT
PACK | CUTANEOUS | Status: DC | PRN
  Administered 2019-06-29: 1

## 2019-06-29 MED ORDER — MORPHINE SULFATE (PF) 2 MG/ML IV SOLN: 1.0000 mg | INTRAVENOUS | Status: DC | PRN

## 2019-06-29 MED ORDER — EPHEDRINE 5 MG/ML INJ
INTRAVENOUS | Status: AC
  Filled 2019-06-29: qty 10

## 2019-06-29 MED ORDER — EPHEDRINE SULFATE-NACL 50-0.9 MG/10ML-% IV SOSY
PREFILLED_SYRINGE | INTRAVENOUS | Status: DC | PRN
  Administered 2019-06-29: 75 mg via INTRAVENOUS

## 2019-06-29 MED ORDER — KETAMINE HCL 10 MG/ML IJ SOLN
INTRAMUSCULAR | Status: AC
  Filled 2019-06-29: qty 1

## 2019-06-29 MED ORDER — ONDANSETRON HCL 4 MG/2ML IJ SOLN: 4.0000 mg | INTRAMUSCULAR | Status: DC | PRN

## 2019-06-29 MED ORDER — APREPITANT 40 MG PO CAPS
40.0000 mg | ORAL_CAPSULE | ORAL | Status: AC
  Administered 2019-06-29: 40 mg via ORAL
  Filled 2019-06-29: qty 1

## 2019-06-29 MED ORDER — ROCURONIUM BROMIDE 10 MG/ML (PF) SYRINGE
PREFILLED_SYRINGE | INTRAVENOUS | Status: DC | PRN
  Administered 2019-06-29: 60 mg via INTRAVENOUS
  Administered 2019-06-29 (×3): 20 mg via INTRAVENOUS

## 2019-06-29 MED ORDER — DEXAMETHASONE SODIUM PHOSPHATE 4 MG/ML IJ SOLN
INTRAMUSCULAR | Status: DC | PRN
  Administered 2019-06-29: 10 mg via INTRAVENOUS

## 2019-06-29 MED ORDER — TISSEEL VH 10 ML EX KIT
PACK | CUTANEOUS | Status: AC
  Filled 2019-06-29: qty 2

## 2019-06-29 MED ORDER — PHENYLEPHRINE 40 MCG/ML (10ML) SYRINGE FOR IV PUSH (FOR BLOOD PRESSURE SUPPORT)
PREFILLED_SYRINGE | INTRAVENOUS | Status: AC
  Filled 2019-06-29: qty 10

## 2019-06-29 MED ORDER — PHENYLEPHRINE 40 MCG/ML (10ML) SYRINGE FOR IV PUSH (FOR BLOOD PRESSURE SUPPORT)
PREFILLED_SYRINGE | INTRAVENOUS | Status: DC | PRN
  Administered 2019-06-29: 120 ug via INTRAVENOUS
  Administered 2019-06-29 (×2): 80 ug via INTRAVENOUS

## 2019-06-29 MED ORDER — ONDANSETRON HCL 4 MG/2ML IJ SOLN
INTRAMUSCULAR | Status: AC
  Filled 2019-06-29: qty 2

## 2019-06-29 MED ORDER — PANTOPRAZOLE SODIUM 40 MG IV SOLR
40.0000 mg | Freq: Every day | INTRAVENOUS | Status: DC
  Administered 2019-06-29: 40 mg via INTRAVENOUS
  Filled 2019-06-29: qty 40

## 2019-06-29 MED ORDER — MIDAZOLAM HCL 5 MG/5ML IJ SOLN
INTRAMUSCULAR | Status: DC | PRN
  Administered 2019-06-29: 2 mg via INTRAVENOUS

## 2019-06-29 MED ORDER — HEPARIN SODIUM (PORCINE) 5000 UNIT/ML IJ SOLN
5000.0000 [IU] | INTRAMUSCULAR | Status: AC
  Administered 2019-06-29: 5000 [IU] via SUBCUTANEOUS
  Filled 2019-06-29: qty 1

## 2019-06-29 MED ORDER — FENTANYL CITRATE (PF) 100 MCG/2ML IJ SOLN
INTRAMUSCULAR | Status: DC | PRN
  Administered 2019-06-29: 100 ug via INTRAVENOUS
  Administered 2019-06-29: 50 ug via INTRAVENOUS
  Administered 2019-06-29: 100 ug via INTRAVENOUS
  Administered 2019-06-29 (×2): 50 ug via INTRAVENOUS

## 2019-06-29 MED ORDER — SCOPOLAMINE 1 MG/3DAYS TD PT72
1.0000 | MEDICATED_PATCH | TRANSDERMAL | Status: DC
  Administered 2019-06-29: 1.5 mg via TRANSDERMAL
  Filled 2019-06-29: qty 1

## 2019-06-29 MED ORDER — HYDROMORPHONE HCL 1 MG/ML IJ SOLN
0.2500 mg | INTRAMUSCULAR | Status: DC | PRN
  Administered 2019-06-29 (×2): 0.5 mg via INTRAVENOUS

## 2019-06-29 MED ORDER — ONDANSETRON HCL 4 MG/2ML IJ SOLN
INTRAMUSCULAR | Status: DC | PRN
  Administered 2019-06-29 (×2): 4 mg via INTRAVENOUS

## 2019-06-29 MED ORDER — MIDAZOLAM HCL 2 MG/2ML IJ SOLN
INTRAMUSCULAR | Status: AC
  Filled 2019-06-29: qty 2

## 2019-06-29 MED ORDER — DEXAMETHASONE SODIUM PHOSPHATE 10 MG/ML IJ SOLN
INTRAMUSCULAR | Status: AC
  Filled 2019-06-29: qty 1

## 2019-06-29 MED ORDER — KETAMINE HCL 10 MG/ML IJ SOLN
INTRAMUSCULAR | Status: DC | PRN
  Administered 2019-06-29: 10 mg via INTRAVENOUS
  Administered 2019-06-29: 30 mg via INTRAVENOUS

## 2019-06-29 MED ORDER — GABAPENTIN 300 MG PO CAPS
300.0000 mg | ORAL_CAPSULE | ORAL | Status: AC
  Administered 2019-06-29: 300 mg via ORAL
  Filled 2019-06-29: qty 1

## 2019-06-29 MED ORDER — OXYCODONE HCL 5 MG/5ML PO SOLN: 5.0000 mg | Freq: Once | ORAL | Status: DC | PRN

## 2019-06-29 MED ORDER — ROCURONIUM BROMIDE 10 MG/ML (PF) SYRINGE
PREFILLED_SYRINGE | INTRAVENOUS | Status: AC
  Filled 2019-06-29: qty 10

## 2019-06-29 MED ORDER — KETOROLAC TROMETHAMINE 30 MG/ML IJ SOLN
30.0000 mg | Freq: Once | INTRAMUSCULAR | Status: AC | PRN
  Administered 2019-06-29: 30 mg via INTRAVENOUS

## 2019-06-29 MED ORDER — ACETAMINOPHEN 160 MG/5ML PO SOLN: 1000.0000 mg | Freq: Three times a day (TID) | ORAL | Status: DC

## 2019-06-29 MED ORDER — SODIUM CHLORIDE 0.9 % IV SOLN
2.0000 g | INTRAVENOUS | Status: AC
  Administered 2019-06-29: 2 g via INTRAVENOUS
  Filled 2019-06-29: qty 2

## 2019-06-29 MED ORDER — SUGAMMADEX SODIUM 200 MG/2ML IV SOLN
INTRAVENOUS | Status: DC | PRN
  Administered 2019-06-29: 300 mg via INTRAVENOUS

## 2019-06-29 MED ORDER — BUPIVACAINE HCL (PF) 0.25 % IJ SOLN
INTRAMUSCULAR | Status: AC
  Filled 2019-06-29: qty 30

## 2019-06-29 MED ORDER — LACTATED RINGERS IR SOLN
Status: DC | PRN
  Administered 2019-06-29: 1000 mL

## 2019-06-29 MED ORDER — LIDOCAINE 2% (20 MG/ML) 5 ML SYRINGE
INTRAMUSCULAR | Status: DC | PRN
  Administered 2019-06-29: 60 mg via INTRAVENOUS

## 2019-06-29 MED ORDER — INSULIN ASPART 100 UNIT/ML ~~LOC~~ SOLN
0.0000 [IU] | SUBCUTANEOUS | Status: DC
  Administered 2019-06-29: 7 [IU] via SUBCUTANEOUS
  Administered 2019-06-29 – 2019-06-30 (×4): 4 [IU] via SUBCUTANEOUS

## 2019-06-29 MED ORDER — LACTATED RINGERS IV SOLN
INTRAVENOUS | Status: DC
  Administered 2019-06-29 (×2): via INTRAVENOUS

## 2019-06-29 MED ORDER — HYDROMORPHONE HCL 1 MG/ML IJ SOLN
INTRAMUSCULAR | Status: AC
  Administered 2019-06-29: 0.5 mg via INTRAVENOUS
  Filled 2019-06-29: qty 1

## 2019-06-29 MED ORDER — LIDOCAINE HCL 2 % IJ SOLN
INTRAMUSCULAR | Status: AC
  Filled 2019-06-29: qty 20

## 2019-06-29 MED ORDER — GLYCOPYRROLATE PF 0.2 MG/ML IJ SOSY
PREFILLED_SYRINGE | INTRAMUSCULAR | Status: AC
  Filled 2019-06-29: qty 1

## 2019-06-29 MED ORDER — ENOXAPARIN SODIUM 30 MG/0.3ML ~~LOC~~ SOLN
30.0000 mg | Freq: Two times a day (BID) | SUBCUTANEOUS | Status: DC
  Administered 2019-06-29: 30 mg via SUBCUTANEOUS
  Filled 2019-06-29: qty 0.3

## 2019-06-29 SURGICAL SUPPLY — 88 items
ADH SKN CLS APL DERMABOND .7 (GAUZE/BANDAGES/DRESSINGS) ×1
APL PRP STRL LF DISP 70% ISPRP (MISCELLANEOUS) ×2
APL SRG 32X5 SNPLK LF DISP (MISCELLANEOUS) ×1
APPLIER CLIP ROT 10 11.4 M/L (STAPLE)
APPLIER CLIP ROT 13.4 12 LRG (CLIP)
APR CLP LRG 13.4X12 ROT 20 MLT (CLIP)
APR CLP MED LRG 11.4X10 (STAPLE)
BLADE SURG 15 STRL LF DISP TIS (BLADE) ×1 IMPLANT
BLADE SURG 15 STRL SS (BLADE) ×3
CABLE HIGH FREQUENCY MONO STRZ (ELECTRODE) IMPLANT
CHLORAPREP W/TINT 26 (MISCELLANEOUS) ×6 IMPLANT
CLIP APPLIE ROT 10 11.4 M/L (STAPLE) IMPLANT
CLIP APPLIE ROT 13.4 12 LRG (CLIP) IMPLANT
CLIP SUT LAPRA TY ABSORB (SUTURE) ×6 IMPLANT
COVER WAND RF STERILE (DRAPES) IMPLANT
DECANTER SPIKE VIAL GLASS SM (MISCELLANEOUS) ×3 IMPLANT
DERMABOND ADVANCED (GAUZE/BANDAGES/DRESSINGS) ×2
DERMABOND ADVANCED .7 DNX12 (GAUZE/BANDAGES/DRESSINGS) ×1 IMPLANT
DEVICE SUT QUICK LOAD TK 5 (STAPLE) IMPLANT
DEVICE SUT TI-KNOT TK 5X26 (MISCELLANEOUS) IMPLANT
DEVICE SUTURE ENDOST 10MM (ENDOMECHANICALS) ×3 IMPLANT
DEVICE TI KNOT TK5 (MISCELLANEOUS)
DISSECTOR BLUNT TIP ENDO 5MM (MISCELLANEOUS) IMPLANT
DRAIN PENROSE 18X1/4 LTX STRL (WOUND CARE) ×3 IMPLANT
GAUZE 4X4 16PLY RFD (DISPOSABLE) IMPLANT
GLOVE SURG SYN 7.5  E (GLOVE) ×2
GLOVE SURG SYN 7.5 E (GLOVE) ×1 IMPLANT
GLOVE SURG SYN 7.5 PF PI (GLOVE) ×1 IMPLANT
GOWN STRL REUS W/TWL XL LVL3 (GOWN DISPOSABLE) ×12 IMPLANT
HOVERMATT SINGLE USE (MISCELLANEOUS) ×3 IMPLANT
KIT BASIN OR (CUSTOM PROCEDURE TRAY) ×3 IMPLANT
KIT GASTRIC LAVAGE 34FR ADT (SET/KITS/TRAYS/PACK) ×3 IMPLANT
KIT TURNOVER KIT A (KITS) IMPLANT
MARKER SKIN DUAL TIP RULER LAB (MISCELLANEOUS) ×3 IMPLANT
NDL SPNL 22GX3.5 QUINCKE BK (NEEDLE) ×1 IMPLANT
NEEDLE SPNL 22GX3.5 QUINCKE BK (NEEDLE) ×3 IMPLANT
PACK CARDIOVASCULAR III (CUSTOM PROCEDURE TRAY) ×3 IMPLANT
PENCIL SMOKE EVACUATOR (MISCELLANEOUS) IMPLANT
QUICK LOAD TK 5 (STAPLE)
RELOAD 45 VASCULAR/THIN (ENDOMECHANICALS) ×6 IMPLANT
RELOAD ENDO STITCH 2.0 (ENDOMECHANICALS) ×30
RELOAD STAPLE 45 2.5 WHT GRN (ENDOMECHANICALS) ×2 IMPLANT
RELOAD STAPLE 45 3.5 BLU ETS (ENDOMECHANICALS) ×1 IMPLANT
RELOAD STAPLE 60 2.6 WHT THN (STAPLE) IMPLANT
RELOAD STAPLE 60 3.6 BLU REG (STAPLE) ×2 IMPLANT
RELOAD STAPLE 60 3.8 GOLD REG (STAPLE) ×1 IMPLANT
RELOAD STAPLE TA45 3.5 REG BLU (ENDOMECHANICALS) ×3 IMPLANT
RELOAD STAPLER BLUE 60MM (STAPLE) ×3 IMPLANT
RELOAD STAPLER GOLD 60MM (STAPLE) ×1 IMPLANT
RELOAD STAPLER WHITE 60MM (STAPLE) IMPLANT
RELOAD SUT SNGL STCH ABSRB 2-0 (ENDOMECHANICALS) ×6 IMPLANT
RELOAD SUT SNGL STCH BLK 2-0 (ENDOMECHANICALS) ×4 IMPLANT
SCISSORS LAP 5X45 EPIX DISP (ENDOMECHANICALS) ×3 IMPLANT
SEALANT SURGICAL APPL DUAL CAN (MISCELLANEOUS) ×3 IMPLANT
SET IRRIG TUBING LAPAROSCOPIC (IRRIGATION / IRRIGATOR) ×3 IMPLANT
SET TUBE SMOKE EVAC HIGH FLOW (TUBING) ×3 IMPLANT
SHEARS HARMONIC ACE PLUS 45CM (MISCELLANEOUS) ×3 IMPLANT
SLEEVE ADV FIXATION 12X100MM (TROCAR) ×2 IMPLANT
SLEEVE ADV FIXATION 5X100MM (TROCAR) ×2 IMPLANT
SLEEVE XCEL OPT CAN 5 100 (ENDOMECHANICALS) IMPLANT
SOL ANTI FOG 6CC (MISCELLANEOUS) ×1 IMPLANT
SOLUTION ANTI FOG 6CC (MISCELLANEOUS) ×2
STAPLER ECHELON LONG 60 440 (INSTRUMENTS) ×3 IMPLANT
STAPLER RELOAD BLUE 60MM (STAPLE) ×9
STAPLER RELOAD GOLD 60MM (STAPLE) ×3
STAPLER RELOAD WHITE 60MM (STAPLE)
SURGILUBE 2OZ TUBE FLIPTOP (MISCELLANEOUS) ×3 IMPLANT
SUT MNCRL AB 4-0 PS2 18 (SUTURE) ×5 IMPLANT
SUT RELOAD ENDO STITCH 2 48X1 (ENDOMECHANICALS) ×6
SUT RELOAD ENDO STITCH 2.0 (ENDOMECHANICALS) ×4
SUT SURGIDAC NAB ES-9 0 48 120 (SUTURE) IMPLANT
SUT VIC AB 2-0 SH 27 (SUTURE) ×6
SUT VIC AB 2-0 SH 27X BRD (SUTURE) ×1 IMPLANT
SUTURE RELOAD END STTCH 2 48X1 (ENDOMECHANICALS) ×6 IMPLANT
SUTURE RELOAD ENDO STITCH 2.0 (ENDOMECHANICALS) ×4 IMPLANT
SYR 10ML ECCENTRIC (SYRINGE) ×3 IMPLANT
SYR 20ML LL LF (SYRINGE) ×6 IMPLANT
SYR 50ML LL SCALE MARK (SYRINGE) IMPLANT
TOWEL OR 17X26 10 PK STRL BLUE (TOWEL DISPOSABLE) ×3 IMPLANT
TRAY FOLEY MTR SLVR 16FR STAT (SET/KITS/TRAYS/PACK) ×3 IMPLANT
TROCAR ADV FIXATION 11X100MM (TROCAR) IMPLANT
TROCAR ADV FIXATION 12X100MM (TROCAR) ×2 IMPLANT
TROCAR ADV FIXATION 5X100MM (TROCAR) ×2 IMPLANT
TROCAR BLADELESS OPT 5 100 (ENDOMECHANICALS) ×2 IMPLANT
TROCAR XCEL 12X100 BLDLESS (ENDOMECHANICALS) ×2 IMPLANT
TUBING CONNECTING 10 (TUBING) IMPLANT
TUBING CONNECTING 10' (TUBING)
TUBING ENDO SMARTCAP (MISCELLANEOUS) ×3 IMPLANT

## 2019-06-29 NOTE — Transfer of Care (Signed)
Immediate Anesthesia Transfer of Care Note  Patient: Tanya Wilcox  Procedure(s) Performed: LAPAROSCOPIC ROUX-EN-Y GASTRIC BYPASS WITH UPPER ENDOSCOPY, ERAS PATHWAY (N/A Abdomen)  Patient Location: PACU  Anesthesia Type:General  Level of Consciousness: awake and patient cooperative  Airway & Oxygen Therapy: Patient Spontanous Breathing and Patient connected to face mask  Post-op Assessment: Report given to RN and Post -op Vital signs reviewed and stable  Post vital signs: Reviewed and stable  Last Vitals:  Vitals Value Taken Time  BP 151/90 06/29/19 1038  Temp    Pulse 92 06/29/19 1039  Resp 14 06/29/19 1039  SpO2 100 % 06/29/19 1039  Vitals shown include unvalidated device data.  Last Pain:  Vitals:   06/29/19 0602  TempSrc:   PainSc: 5       Patients Stated Pain Goal: 3 ([id number])  Complications: No apparent anesthesia complications

## 2019-06-29 NOTE — Interval H&P Note (Signed)
History and Physical Interval Note:  06/29/2019 7:14 AM  Tanya Wilcox  has presented today for surgery, with the diagnosis of Morbid Obesity, DM II, HTN, Dyslipiemia.  The various methods of treatment have been discussed with the patient and family.  To contact friend, Lum Keas, post op.  She has stopped smoking and understands that she needs to stay stopped.  After consideration of risks, benefits and other options for treatment, the patient has consented to  Procedure(s): LAPAROSCOPIC ROUX-EN-Y GASTRIC BYPASS WITH UPPER ENDOSCOPY, ERAS Pathway (N/A) as a surgical intervention.  The patient's history has been reviewed, patient examined, no change in status, stable for surgery.  I have reviewed the patient's chart and labs.  Questions were answered to the patient's satisfaction.     Shann Medal

## 2019-06-29 NOTE — Progress Notes (Signed)
Discussed post op day goals with patient including ambulation, IS, diet progression, pain, and nausea control.  BSTOP education provided including BSTOP information guide, "Guide for Pain Management after your Bariatric Procedure".  Questions answered. 

## 2019-06-29 NOTE — Anesthesia Postprocedure Evaluation (Signed)
Anesthesia Post Note  Patient: Tanya Wilcox  Procedure(s) Performed: LAPAROSCOPIC ROUX-EN-Y GASTRIC BYPASS WITH UPPER ENDOSCOPY, ERAS PATHWAY (N/A Abdomen)     Patient location during evaluation: PACU Anesthesia Type: General Level of consciousness: awake and alert Pain management: pain level controlled Vital Signs Assessment: post-procedure vital signs reviewed and stable Respiratory status: spontaneous breathing, nonlabored ventilation, respiratory function stable and patient connected to nasal cannula oxygen Cardiovascular status: blood pressure returned to baseline and stable Postop Assessment: no apparent nausea or vomiting Anesthetic complications: no    Last Vitals:  Vitals:   06/29/19 1432 06/29/19 1520  BP: 139/81 134/79  Pulse: 68 69  Resp: 18 18  Temp: 36.6 C 36.8 C  SpO2: 98% 98%    Last Pain:  Vitals:   06/29/19 1520  TempSrc: Oral  PainSc:                  Ryan P Ellender

## 2019-06-29 NOTE — Progress Notes (Signed)
Inpatient Diabetes Program Recommendations  AACE/ADA: New Consensus Statement on Inpatient Glycemic Control (2015)  Target Ranges:  Prepandial:   less than 140 mg/dL      Peak postprandial:   less than 180 mg/dL (1-2 hours)      Critically ill patients:  140 - 180 mg/dL   Lab Results  Component Value Date   GLUCAP 225 (H) 06/29/2019   HGBA1C 6.7 (H) 06/25/2019    Review of Glycemic Control  Diabetes history: DM2 Outpatient Diabetes medications: Toujeo 84 units QD (not taken x 2 weeks), Invokana 100 mg before breakfast (not taking), Humalog 15 units QD, Ozempic 0.5 mg Q Fridays. Current orders for Inpatient glycemic control: Novolog 0-20 units Q4H   HgbA1C - 6.7%. Has not taken Toujeo and Ozempic x 2 weeks.    Inpatient Diabetes Program Recommendations:     Recommendations for discharge:  Novolog 0-20 units tidwc and 0-5 units QHS  Check blood sugars 4x/day and call Endo if blood sugars > 200 mg/dL x 2 readings.   Will follow.  Thank you. Lorenda Peck, RD, LDN, CDE Inpatient Diabetes Coordinator [phone]

## 2019-06-29 NOTE — Op Note (Signed)
Tanya Wilcox [id number] Jul 21, 1971 06/29/2019  Preoperative diagnosis: roux en Y gastric bypass in progress  Postoperative diagnosis: Same   Procedure: Upper endoscopy   Surgeon: Catalina Antigua B. Hassell Done  M.D., FACS   Anesthesia: Gen.   Indications for procedure: This patient was undergoing a roux en Y gastric bypass by Dr. Lucia Gaskins .    Description of procedure: The endoscopy was placed in the mouth and into the oropharynx and under endoscopic vision it was advanced to the esophagogastric junction.  She has a small mouth and the entrance to her esophagus was more difficult than usual.   The pouch was insufflated and it was ~ 5 cm in length.  The EG junction was at 40 cm No bleeding or leaks were detected.  The scope was withdrawn without difficulty.     Matt B. Hassell Done, MD, FACS General, Bariatric, & Minimally Invasive Surgery [facility], [place]

## 2019-06-29 NOTE — Op Note (Signed)
PATIENT:   Tanya Wilcox DOB:   26-Oct-1970 MRN:   [id number]  DATE OF PROCEDURE: 06/29/2019                   FACILITY:  [facility]  OPERATIVE REPORT  PREOPERATIVE DIAGNOSIS:  Morbid obesity.  POSTOPERATIVE DIAGNOSIS:  Morbid obesity (weight 241, BMI of 37.8).  PROCEDURE:  Laparoscopic Roux-en-Y gastric bypass, antecolic, antegastric (intraoperative upper endoscopy by Dr. Rockne Coons)  SURGEON:  Fenton Malling. Lucia Gaskins, MD  FIRST ASSISTANTRockne Coons, M.D.  ANESTHESIA:  General endotracheal.  Anesthesiologist: Murvin Natal, MD CRNA: Claudia Desanctis, CRNA; Maxwell Caul, CRNA  General  ESTIMATED BLOOD LOSS:  Minimal.  LOCAL ANESTHESIA:  30 cc of 1/4% Marcaine + 20 cc of Exparel  COMPLICATIONS:  None.  INDICATION FOR SURGERY:  Tanya Wilcox is a 48 y.o. AA female who sees Debbrah Alar, NP as her primary care doctor.  She has completed our preoperative bariatric program and now comes for a laparoscopic Roux-en-Y gastric bypass.  She has also stopped smoking.  The indications, potential complications of surgery were explained to the patient.  Potential complications of the surgery include, but are not limited to, bleeding, infection, DVT, open surgery, and long-term nutritional consequences.  OPERATIVE NOTE:  The patient taken to room #1 at [facility] where Ms. Olinde underwent a general endotracheal anesthetic, supervised by Anesthesiologist: Murvin Natal, MD CRNA: Claudia Desanctis, CRNA; Maxwell Caul, CRNA.  The patient was given 2 g of cefotetan at the beginning of the procedure.  A time-out was held and surgical checklist run.  The abdomen was prepped with ChloraPrep and sterilely draped.  I accessed the abdominal cavity through the left upper quadrant using a 12 mm Optiview trocar.  I placed 6 additional trocars: 5 mm subxiphoid, 12 mm right subcostal, 12 mm right paramedian, 12 mm left paramedian, 5 mm lateral subcostal, and a 5 mm below to the right of the umbilicus.  I placed a block along both abdominal side walls (20 cc per side) using a mixture of Exparel and Marcaine.  The abdomen was insufflated and abdominal exploration carried out.  Right and left lobes of liver unremarkable.  The stomach that I could see was unremarkable.  The patient had a moderate amount of greater omentum which draped over the bowel.  I was able to push the omentum and transverse colon up and identified the ligament of Treitz to start the operation.  I measured 40 cm of the jejunum, starting at the ligament of Tritz, and divided the jejunum with a white load of 45 mm Ethicon Endo-GIA stapler.  I divided a short length into the mesentery.  I measured 100 cm of jejunum for the future gastric limb.  I put a Penrose drain on the future gastric limb of the jejunum.  I then did a side-to-side jejunojejunostomy.   I used a 45 mm white load of the Ethicon Endo-GIA stapler for the anastomosis.  I closed the enterotomy with 2 running 2-0 Vicryl sutures.  I tested the JJ anastomosis with an alligator forceps and then covered this with Tisseel.  I closed the mesenteric defect with a running 2-0 silk suture with a Laparo-tye on each end.  I then divided the omentum with a Harmonic Scalpel.  I positioned the patient in reverse Trendelenburg and placed the liver retractor, which was introduced into the peritoneal cavity through a subxiphoid 5 mm trocar puncture, under the left lobe of the liver.  I then identified the gastroesophageal junction.  I went to the left at the angle of His and made a window at the left side of the esophago-gastric junction for a target as my dissection.  I then went on the lesser curve of the stomach, measured 5 cm from the gastroesophageal junction down the lesser curve and dissected into the lesser sac from the lesser curvature side of the stomach.  I did the first firing of a 60 mm gold load Ethicon Eschelon stapler and then did 3 firings of the 60 mm blue load Ethicon  Eschelon stapler.  This created a gastric pouch approximately 5 cm in length and 3 cm in width.  There was no bleeding from either the pouch or the stomach remnant site.  I placed Tisseel on the pouch side along the new greater curvature.  I over sewed the gastric remnant with a locking 2-0 Vicryl suture with a Laparo-tye on each end..  I then brought the jejunum ante-colic, ante-gastric up to the new stomach pouch and placed a posterior running 2-0 Vicryl suture.  I then made an enterotomy into the stomach using the Ewald as a back stop and an enterotomy into the jejunum.  I did a stapled side-to-side gastrojejunal anastomosis using these two enterotomies with a 45 mm blue load of the Ethicon Endo GIA stapler.  I tried to create a 2.5 cm gastrojejunal anastomosis.  I closed the enterotomy with a 2 running 2-0 Vicryl sutures.  I passed the Ewald tube through the gastrojejunal anastomosis and then did an anterior Connell suture running of 2-0 Vicryl suture for the anterior layer of the gastrojejunostomy.  The Ewald tube was then removed without difficulty.  I then closed the Biola defect with a figure-of-eight 2-0 silk suture between the mesentery of the transverse colon and the mesentery of the distal jejunum.  Dr. Hassell Done then scrubbed out and did an intraoperative upper endoscopy.  He identified the esophagogastric junction about 40 cm, the gastrojejunal anastomosis about 45 cm.  I clamped off the small bowel.  He insufflated air and I flooded the abdomen with saline. There was no bubbling or evidence of air leak.  He then withdrew the scope and he will dictate that portion of the operation.    I then re-inspected the anastomoses, sucked out the saline, placed Tisseel over the stomach pouch and gastrojejunal anastomosis.   The liver retractor was removed.  The trocars were removed.  There was no bleeding at any trocar site.  I infiltrated 10 cc of the remaining local at the trocar sites.  The skin at  each trocar site was closed with a 4-0 Monocryl suture.  After the skin incisions were closed with sutures they were painted with DermaBond.  I have a surgeon as a first assist to retract, expose, and assist on this difficult operation.  The sponge and needle count were correct at the end of the case.  The patient tolerated the procedure well, was transported to the recovery room in good condition.   Above pictures:  2 views of the gastrojejunostomy   Alphonsa Overall, MD, [facility] Surgery Office phone:  [phone]

## 2019-06-29 NOTE — Progress Notes (Signed)
PHARMACY CONSULT FOR:  Risk Assessment for Post-Discharge VTE Following Bariatric Surgery  Post-Discharge VTE Risk Assessment: This patient's probability of 30-day post-discharge VTE is increased due to the factors marked:   Female    Age >/=60 years    BMI >/=50 kg/m2    CHF    Dyspnea at Rest    Paraplegia   X Non-gastric-band surgery    Operation Time >/=3 hr    Return to OR     Length of Stay >/= 3 d      Hx of VTE   Hypercoagulable condition   Significant venous stasis   Predicted probability of 30-day post-discharge VTE: 0.16%   Recommendation for Discharge: No pharmacologic prophylaxis post-discharge    Tanya Wilcox is a 48 y.o. female who underwent  Laparoscopic Roux-en-Y gastric bypass, antecolic, antegastric  on 06/29/2019.   Case start: 0754 Case end: 1024   No Known Allergies  Patient Measurements: Height: _0  (170.2 cm) Weight: 240 lb (108.9 kg) IBW/kg (Calculated) : 61.6 Body mass index is 37.59 kg/m.  No results for input(s): WBC, HGB, HCT, PLT, APTT, CREATININE, LABCREA, CREATININE, CREAT24HRUR, MG, PHOS, ALBUMIN, PROT, ALBUMIN, AST, ALT, ALKPHOS, BILITOT, BILIDIR, IBILI in the last 72 hours. Estimated Creatinine Clearance: 109.3 mL/min (by C-G formula based on SCr of 0.78 mg/dL).    Past Medical History:  Diagnosis Date  . Alcohol use   . Anxiety   . Bipolar disorder ([place])   . Depression   . Diabetes ([place])    TYPE 2   . Gestational diabetes    borderline II  . Osteoarthritis    knee, RIGHT   . Tobacco abuse      Medications Prior to Admission  Medication Sig Dispense Refill Last Dose  . ALPRAZolam (XANAX) 0.5 MG tablet Take 0.5 mg by mouth 2 (two) times daily as needed for anxiety.   1 06/29/2019 at 0445  . ARIPiprazole (ABILIFY) 5 MG tablet Take 5 mg by mouth daily.   1 06/28/2019 at Unknown time  . atorvastatin (LIPITOR) 40 MG tablet Take 1 tablet (40 mg total) by mouth daily. 90 tablet 1 06/28/2019 at Unknown time  . Blood Glucose  Monitoring Suppl (ONETOUCH VERIO) w/Device KIT USE TO TEST BLOOD SUGAR THREE TIMES DAILY 1 kit 0 06/29/2019 at Unknown time  . Calcium Carbonate (CALCI-CHEW PO) Take 2 tablets by mouth 2 (two) times daily.   06/27/2019  . glucose blood test strip Use OneTouch verio test strips as instructed to check blood sugar three times daily. 100 each 2 06/29/2019 at Unknown time  . ibuprofen (ADVIL) 200 MG tablet Take 400 mg by mouth every 8 (eight) hours as needed (for pain.).   Past Month at Unknown time  . insulin lispro (HUMALOG KWIKPEN) 100 UNIT/ML KwikPen ADMINISTER 10 TO 15 UNITS UNDER THE SKIN BEFORE MEALS (Patient taking differently: Inject 15 Units into the skin daily. ) 45 mL 2 06/28/2019 at Unknown time  . Insulin Pen Needle (B-D ULTRAFINE III SHORT PEN) 31G X 8 MM MISC Use to inject insulin 4 times daily. 200 each 3 06/29/2019 at Unknown time  . levonorgestrel (MIRENA) 20 MCG/24HR IUD 1 each by Intrauterine route once.     Marland Kitchen lisinopril (PRINIVIL,ZESTRIL) 20 MG tablet TAKE 1 TABLET BY MOUTH DAILY (Patient taking differently: Take 20 mg by mouth daily. ) 90 tablet 1 06/28/2019 at Unknown time  . Multiple Vitamins-Minerals (BARIATRIC MULTIVITAMINS/IRON PO) Take 2 tablets by mouth 2 (two) times daily.   06/27/2019  .  omeprazole (PRILOSEC) 40 MG capsule Take 40 mg by mouth daily.   06/28/2019 at Unknown time  . ondansetron (ZOFRAN-ODT) 4 MG disintegrating tablet Take 4 mg by mouth every 8 (eight) hours as needed for nausea or vomiting.     . sertraline (ZOLOFT) 25 MG tablet Take 25 mg by mouth daily.  1 06/28/2019 at Unknown time  . valACYclovir (VALTREX) 500 MG tablet Take 1 tablet (500 mg total) by mouth daily. 90 tablet 1 06/28/2019 at Unknown time  . VITAMIN D, CHOLECALCIFEROL, PO Take 1 tablet by mouth daily.    Past Month at Unknown time  . canagliflozin (INVOKANA) 100 MG TABS tablet 1 tablet before breakfast (Patient not taking: Reported on 06/19/2019) 90 tablet 1 Not Taking at Unknown time  . Insulin  Glargine, 2 Unit Dial, (TOUJEO MAX SOLOSTAR) 300 UNIT/ML SOPN Inject 84 Units into the skin daily. 129 mL 2 06/15/2019  . Semaglutide,0.25 or 0.5MG/DOS, (OZEMPIC, 0.25 OR 0.5 MG/DOSE,) 2 MG/1.5ML SOPN Inject 0.5 mg into the skin once a week. Inject 0.82m under the skin once weekly. (Patient taking differently: Inject 0.5 mg into the skin every Friday. Inject 0.554munder the skin once weekly.) 1 pen 2 06/12/2019       Tanya Wilcox P 06/29/2019,1:10 PM

## 2019-06-29 NOTE — Anesthesia Procedure Notes (Signed)
Procedure Name: Intubation Date/Time: 06/29/2019 7:31 AM Performed by: Claudia Desanctis, CRNA Pre-anesthesia Checklist: Patient identified, Emergency Drugs available, Suction available and Patient being monitored Patient Re-evaluated:Patient Re-evaluated prior to induction Oxygen Delivery Method: Circle system utilized Preoxygenation: Pre-oxygenation with 100% oxygen Induction Type: IV induction Ventilation: Mask ventilation without difficulty and Oral airway inserted - appropriate to patient size Laryngoscope Size: 2 and Miller Grade View: Grade I Tube type: Oral Tube size: 7.0 mm Number of attempts: 1 Airway Equipment and Method: Stylet Placement Confirmation: ETT inserted through vocal cords under direct vision,  positive ETCO2 and breath sounds checked- equal and bilateral Secured at: 21 cm Tube secured with: Tape Dental Injury: Teeth and Oropharynx as per pre-operative assessment

## 2019-06-29 NOTE — Discharge Instructions (Signed)
° ° ° °GASTRIC BYPASS/SLEEVE ° Home Care Instructions ° ° These instructions are to help you care for yourself when you go home. ° °Call: If you have any problems. °• Call [phone] and ask for the surgeon on call °• If you need immediate help, come to the ER at [facility].  °• Tell the ER staff that you are a new post-op gastric bypass or gastric sleeve patient °  °Signs and symptoms to report: • Severe vomiting or nausea °o If you cannot keep down clear liquids for longer than 1 day, call your surgeon  °• Abdominal pain that does not get better after taking your pain medication °• Fever over 100.4° F with chills °• Heart beating over 100 beats a minute °• Shortness of breath at rest °• Chest pain °•  Redness, swelling, drainage, or foul odor at incision (surgical) sites °•  If your incisions open or pull apart °• Swelling or pain in calf (lower leg) °• Diarrhea (Loose bowel movements that happen often), frequent watery, uncontrolled bowel movements °• Constipation, (no bowel movements for 3 days) if this happens: Pick one °o Milk of Magnesia, 2 tablespoons by mouth, 3 times a day for 2 days if needed °o Stop taking Milk of Magnesia once you have a bowel movement °o Call your doctor if constipation continues °Or °o Miralax  (instead of Milk of Magnesia) following the label instructions °o Stop taking Miralax once you have a bowel movement °o Call your doctor if constipation continues °• Anything you think is not normal °  °Normal side effects after surgery: • Unable to sleep at night or unable to focus °• Irritability or moody °• Being tearful (crying) or depressed °These are common complaints, possibly related to your anesthesia medications that put you to sleep, stress of surgery, and change in lifestyle.  This usually goes away a few weeks after surgery.  If these feelings continue, call your primary care doctor. °  °Wound Care: You may have surgical glue, steri-strips, or staples over your incisions after  surgery °• Surgical glue:  Looks like a clear film over your incisions and will wear off a little at a time °• Steri-strips: Strips of tape over your incisions. You may notice a yellowish color on the skin under the steri-strips. This is used to make the   steri-strips stick better. Do not pull the steri-strips off - let them fall off °• Staples: Staples may be removed before you leave the hospital °o If you go home with staples, call [facility], [phone] at for an appointment with your surgeon’s nurse to have staples removed 10 days after surgery. °• Showering: You may shower two (2) days after your surgery unless your surgeon tells you differently °o Wash gently around incisions with warm soapy water, rinse well, and gently pat dry  °o No tub baths until staples are removed, steri-strips fall off or glue is gone.  °  °Medications: • Medications should be liquid or crushed if larger than the size of a dime °• Extended release pills (medication that release a little bit at a time through the day) should NOT be crushed or cut. (examples include XL, ER, DR, SR) °• Depending on the size and number of medications you take, you may need to space (take a few throughout the day)/change the time you take your medications so that you do not over-fill your pouch (smaller stomach) °• Make sure you follow-up with your primary care doctor to   make medication changes needed during rapid weight loss and life-style changes °• If you have diabetes, follow up with the doctor that orders your diabetes medication(s) within one week after surgery and check your blood sugar regularly. °• Do not drive while taking prescription pain medication  °• It is ok to take Tylenol by the bottle instructions with your pain medicine or instead of your pain medicine as needed.  DO NOT TAKE NSAIDS (EXAMPLES OF NSAIDS:  IBUPROFREN/ NAPROXEN)  °Diet:                    First 2 Weeks ° You will see the dietician t about two (2) weeks  after your surgery. The dietician will increase the types of foods you can eat if you are handling liquids well: °• If you have severe vomiting or nausea and cannot keep down clear liquids lasting longer than 1 day, call your surgeon @ ([phone]) °Protein Shake °• Drink at least 2 ounces of shake 5-6 times per day °• Each serving of protein shakes (usually 8 - 12 ounces) should have: °o 15 grams of protein  °o And no more than 5 grams of carbohydrate  °• Goal for protein each day: °o Men = 80 grams per day °o Women = 60 grams per day °• Protein powder may be added to fluids such as non-fat milk or Lactaid milk or unsweetened Soy/Almond milk (limit to 35 grams added protein powder per serving) ° °Hydration °• Slowly increase the amount of water and other clear liquids as tolerated (See Acceptable Fluids) °• Slowly increase the amount of protein shake as tolerated  °•  Sip fluids slowly and throughout the day.  Do not use straws. °• May use sugar substitutes in small amounts (no more than 6 - 8 packets per day; i.e. Splenda) ° °Fluid Goal °• The first goal is to drink at least 8 ounces of protein shake/drink per day (or as directed by the nutritionist); some examples of protein shakes are Syntrax Nectar, Adkins Advantage, EAS Edge HP, and Unjury. See handout from pre-op Bariatric Education Class: °o Slowly increase the amount of protein shake you drink as tolerated °o You may find it easier to slowly sip shakes throughout the day °o It is important to get your proteins in first °• Your fluid goal is to drink 64 - 100 ounces of fluid daily °o It may take a few weeks to build up to this °• 32 oz (or more) should be clear liquids  °And  °• 32 oz (or more) should be full liquids (see below for examples) °• Liquids should not contain sugar, caffeine, or carbonation ° °Clear Liquids: °• Water or Sugar-free flavored water (i.e. Fruit H2O, Propel) °• Decaffeinated coffee or tea (sugar-free) °• Crystal Lite, Wyler’s Lite,  Minute Maid Lite °• Sugar-free Jell-O °• Bouillon or broth °• Sugar-free Popsicle:   *Less than 20 calories each; Limit 1 per day ° °Full Liquids: °Protein Shakes/Drinks + 2 choices per day of other full liquids °• Full liquids must be: °o No More Than 15 grams of Carbs per serving  °o No More Than 3 grams of Fat per serving °• Strained low-fat cream soup (except Cream of Potato or Tomato) °• Non-Fat milk °• Fat-free Lactaid Milk °• Unsweetened Soy Or Unsweetened Almond Milk °• Low Sugar yogurt (Dannon Lite & Fit, Greek yogurt; Oikos Triple Zero; Chobani Simply 100; Yoplait 100 calorie Greek - No Fruit on the Bottom) ° °  °Vitamins   and Minerals • Start 1 day after surgery unless otherwise directed by your surgeon °• 2 Chewable Bariatric Specific Multivitamin / Multimineral Supplement with iron (Example: Bariatric Advantage Multi EA) °• Chewable Calcium with Vitamin D-3 °(Example: 3 Chewable Calcium Plus 600 with Vitamin D-3) °o Take 500 mg three (3) times a day for a total of 1500 mg each day °o Do not take all 3 doses of calcium at one time as it may cause constipation, and you can only absorb 500 mg  at a time  °o Do not mix multivitamins containing iron with calcium supplements; take 2 hours apart °• Menstruating women and those with a history of anemia (a blood disease that causes weakness) may need extra iron °o Talk with your doctor to see if you need more iron °• Do not stop taking or change any vitamins or minerals until you talk to your dietitian or surgeon °• Your Dietitian and/or surgeon must approve all vitamin and mineral supplements °  °Activity and Exercise: Limit your physical activity as instructed by your doctor.  It is important to continue walking at home.  During this time, use these guidelines: °• Do not lift anything greater than ten (10) pounds for at least two (2) weeks °• Do not go back to work or drive until your surgeon says you can °• You may have sex when you feel comfortable  °o It is  VERY important for female patients to use a reliable birth control method; fertility often increases after surgery  °o All hormonal birth control will be ineffective for 30 days after surgery due to medications given during surgery a barrier method must be used. °o Do not get pregnant for at least 18 months °• Start exercising as soon as your doctor tells you that you can °o Make sure your doctor approves any physical activity °• Start with a simple walking program °• Walk 5-15 minutes each day, 7 days per week.  °• Slowly increase until you are walking 30-45 minutes per day °Consider joining our BELT program. [phone] or email [email or web address] °  °Special Instructions Things to remember: °• Use your CPAP when sleeping if this applies to you ° °• [facility] has two free Bariatric Surgery Support Groups that meet monthly °o The 3rd Thursday of each month, 6 pm, [facility]  °o The 2nd Friday of each month, 11:45 am in the private dining room in the basement of [facility] °• It is very important to keep all follow up appointments with your surgeon, dietitian, primary care physician, and behavioral health practitioner °• Routine follow up schedule with your surgeon include appointments at 2-3 weeks, 6-8 weeks, 6 months, and 1 year at a minimum.  Your surgeon may request to see you more often.   °o After the first year, please follow up with your bariatric surgeon and dietitian at least once a year in order to maintain best weight loss results °[facility]: [phone] °[facility]: [phone] °Bariatric Nurse Coordinator: [phone] °  °   Reviewed and Endorsed  °by [facility] Patient Education Committee, June, 2016 °Edits Approved: Aug, 2018 ° ° ° °

## 2019-06-30 ENCOUNTER — Telehealth: Payer: Self-pay

## 2019-06-30 ENCOUNTER — Encounter (HOSPITAL_COMMUNITY): Payer: Self-pay | Admitting: Surgery

## 2019-06-30 LAB — CBC WITH DIFFERENTIAL/PLATELET
Abs Immature Granulocytes: 0.06 10*3/uL (ref 0.00–0.07)
Basophils Absolute: 0 10*3/uL (ref 0.0–0.1)
Basophils Relative: 0 %
Eosinophils Absolute: 0 10*3/uL (ref 0.0–0.5)
Eosinophils Relative: 0 %
HCT: 36.9 % (ref 36.0–46.0)
Hemoglobin: 12.2 g/dL (ref 12.0–15.0)
Immature Granulocytes: 0 %
Lymphocytes Relative: 15 %
Lymphs Abs: 2.2 10*3/uL (ref 0.7–4.0)
MCH: 32.7 pg (ref 26.0–34.0)
MCHC: 33.1 g/dL (ref 30.0–36.0)
MCV: 98.9 fL (ref 80.0–100.0)
Monocytes Absolute: 1.3 10*3/uL — ABNORMAL HIGH (ref 0.1–1.0)
Monocytes Relative: 9 %
Neutro Abs: 11.5 10*3/uL — ABNORMAL HIGH (ref 1.7–7.7)
Neutrophils Relative %: 76 %
Platelets: 309 10*3/uL (ref 150–400)
RBC: 3.73 MIL/uL — ABNORMAL LOW (ref 3.87–5.11)
RDW: 11.3 % — ABNORMAL LOW (ref 11.5–15.5)
WBC: 15 10*3/uL — ABNORMAL HIGH (ref 4.0–10.5)
nRBC: 0 % (ref 0.0–0.2)

## 2019-06-30 LAB — GLUCOSE, CAPILLARY
Glucose-Capillary: 161 mg/dL — ABNORMAL HIGH (ref 70–99)
Glucose-Capillary: 199 mg/dL — ABNORMAL HIGH (ref 70–99)

## 2019-06-30 MED ORDER — OXYCODONE HCL 5 MG PO TABS: 5.0000 mg | ORAL_TABLET | Freq: Four times a day (QID) | ORAL | 0 refills | Status: DC | PRN

## 2019-06-30 NOTE — Discharge Summary (Signed)
Physician Discharge Summary  Patient ID:  Tanya Wilcox  MRN: [id number]  DOB/AGE: 04-08-1971 48 y.o.  Admit date: 06/29/2019 Discharge date: 06/30/2019  Discharge Diagnoses:  1. Morbid obesity   (weight241, BMI of37.8) 2.DIABETES MELLITUS, INSULIN DEPENDENT (IDDM), CONTROLLED (E11.9) Sees Dr. Dwyane Dee Insulin dependent 3.ESSENTIAL HYPERTENSION (I10) 4. Quit smoking 5. Chronic knee pain 6. Dyslipidemia 7. She saw Myrtie Cruise for psych ([facility]) 8. Bipolar disease She sees Special educational needs teacher and the [employer] She is about to see a new therapist - ? Name   Active Problems:   Morbid obesity ([place])  Operation: Procedure(s):  LAPAROSCOPIC ROUX-EN-Y GASTRIC BYPASS WITH UPPER ENDOSCOPY on 06/29/2019 - D. Lucia Gaskins  Discharged Condition: good  Hospital Course: Tanya Wilcox is an 48 y.o. female whose primary care physician is Debbrah Alar, NP and who was admitted 06/29/2019 with a chief complaint of morbid obesity.  She has been through our bariatric pre op program.   She was brought to the operating room on 06/29/2019 and underwent  LAPAROSCOPIC ROUX-EN-Y GASTRIC BYPASS WITH UPPER ENDOSCOPY.   She has done very well.  She is now one day post op.  She is tolerating liquids and her protein drinks. She is interested in going home, which should be fine.  The discharge instructions were reviewed with the patient.  Consults: None  Significant Diagnostic Studies: Results for orders placed or performed during the hospital encounter of 06/29/19  Pregnancy, urine STAT morning of surgery  Result Value Ref Range   Preg Test, Ur NEGATIVE NEGATIVE  Glucose, capillary  Result Value Ref Range   Glucose-Capillary 179 (H) 70 - 99 mg/dL  Glucose, capillary  Result Value Ref Range   Glucose-Capillary 209 (H) 70 - 99 mg/dL  Hemoglobin and hematocrit, blood  Result Value Ref Range   Hemoglobin 13.3 12.0 - 15.0 g/dL   HCT  39.7 36.0 - 46.0 %  Glucose, capillary  Result Value Ref Range   Glucose-Capillary 225 (H) 70 - 99 mg/dL  CBC WITH DIFFERENTIAL  Result Value Ref Range   WBC 15.0 (H) 4.0 - 10.5 K/uL   RBC 3.73 (L) 3.87 - 5.11 MIL/uL   Hemoglobin 12.2 12.0 - 15.0 g/dL   HCT 36.9 36.0 - 46.0 %   MCV 98.9 80.0 - 100.0 fL   MCH 32.7 26.0 - 34.0 pg   MCHC 33.1 30.0 - 36.0 g/dL   RDW 11.3 (L) 11.5 - 15.5 %   Platelets 309 150 - 400 K/uL   nRBC 0.0 0.0 - 0.2 %   Neutrophils Relative % 76 %   Neutro Abs 11.5 (H) 1.7 - 7.7 K/uL   Lymphocytes Relative 15 %   Lymphs Abs 2.2 0.7 - 4.0 K/uL   Monocytes Relative 9 %   Monocytes Absolute 1.3 (H) 0.1 - 1.0 K/uL   Eosinophils Relative 0 %   Eosinophils Absolute 0.0 0.0 - 0.5 K/uL   Basophils Relative 0 %   Basophils Absolute 0.0 0.0 - 0.1 K/uL   Immature Granulocytes 0 %   Abs Immature Granulocytes 0.06 0.00 - 0.07 K/uL  Glucose, capillary  Result Value Ref Range   Glucose-Capillary 200 (H) 70 - 99 mg/dL  Glucose, capillary  Result Value Ref Range   Glucose-Capillary 199 (H) 70 - 99 mg/dL  Glucose, capillary  Result Value Ref Range   Glucose-Capillary 161 (H) 70 - 99 mg/dL    No results found.  Discharge Exam:  Vitals:   06/30/19 0400 06/30/19 0909  BP: 121/65  137/75  Pulse: 69 68  Resp: 16 17  Temp: 99.3 F (37.4 C) 98.3 F (36.8 C)  SpO2: 100% 100%    General: Obese AA F who is alert and generally healthy appearing.  Lungs: Clear to auscultation and symmetric breath sounds.  She pulls 1,400 cc on the incentive spirometer Heart:  RRR. No murmur or rub. Abdomen: Soft. No mass. No hernia. Some bowel sounds.  Her incisions look good.  Discharge Medications:   Allergies as of 06/30/2019   No Known Allergies     Medication List    TAKE these medications   ALPRAZolam 0.5 MG tablet Commonly known as: XANAX Take 0.5 mg by mouth 2 (two) times daily as needed for anxiety.   ARIPiprazole 5 MG tablet Commonly known as: ABILIFY Take 5 mg  by mouth daily.   atorvastatin 40 MG tablet Commonly known as: LIPITOR Take 1 tablet (40 mg total) by mouth daily.   B-D ULTRAFINE III SHORT PEN 31G X 8 MM Misc Generic drug: Insulin Pen Needle Use to inject insulin 4 times daily.   BARIATRIC MULTIVITAMINS/IRON PO Take 2 tablets by mouth 2 (two) times daily.   CALCI-CHEW PO Take 2 tablets by mouth 2 (two) times daily.   canagliflozin 100 MG Tabs tablet Commonly known as: Invokana 1 tablet before breakfast   glucose blood test strip Use OneTouch verio test strips as instructed to check blood sugar three times daily.   ibuprofen 200 MG tablet Commonly known as: ADVIL Take 400 mg by mouth every 8 (eight) hours as needed (for pain.). Notes to patient: Avoid NSAIDs    insulin lispro 100 UNIT/ML KwikPen Commonly known as: HumaLOG KwikPen ADMINISTER 10 TO 15 UNITS UNDER THE SKIN BEFORE MEALS What changed:   how much to take  how to take this  when to take this  additional instructions   levonorgestrel 20 MCG/24HR IUD Commonly known as: MIRENA 1 each by Intrauterine route once.   lisinopril 20 MG tablet Commonly known as: ZESTRIL TAKE 1 TABLET BY MOUTH DAILY Notes to patient: Monitor Blood Pressure Daily and keep a log for primary care physician.  You may need to make changes to your medications with rapid weight loss.     omeprazole 40 MG capsule Commonly known as: PRILOSEC Take 40 mg by mouth daily.   ondansetron 4 MG disintegrating tablet Commonly known as: ZOFRAN-ODT Take 4 mg by mouth every 8 (eight) hours as needed for nausea or vomiting.   OneTouch Verio w/Device Kit USE TO TEST BLOOD SUGAR THREE TIMES DAILY   oxyCODONE 5 MG immediate release tablet Commonly known as: Oxy IR/ROXICODONE Take 1 tablet (5 mg total) by mouth every 6 (six) hours as needed for severe pain.   Ozempic (0.25 or 0.5 MG/DOSE) 2 MG/1.5ML Sopn Generic drug: Semaglutide(0.25 or 0.5MG/DOS) Inject 0.5 mg into the skin once a week.  Inject 0.53m under the skin once weekly. What changed: when to take this   sertraline 25 MG tablet Commonly known as: ZOLOFT Take 25 mg by mouth daily.   Toujeo Max SoloStar 300 UNIT/ML Sopn Generic drug: Insulin Glargine (2 Unit Dial) Inject 84 Units into the skin daily. Notes to patient: Monitor Blood Sugar Frequently and keep a log for primary care physician, you may need to adjust medication dosage with rapid weight loss.     valACYclovir 500 MG tablet Commonly known as: VALTREX Take 1 tablet (500 mg total) by mouth daily.   VITAMIN D (CHOLECALCIFEROL) PO Take 1 tablet  by mouth daily.       Disposition: Discharge disposition: 01-Home or Self Care       Discharge Instructions    Ambulate hourly while awake   Complete by: As directed    Call MD for:  difficulty breathing, headache or visual disturbances   Complete by: As directed    Call MD for:  persistant dizziness or light-headedness   Complete by: As directed    Call MD for:  persistant nausea and vomiting   Complete by: As directed    Call MD for:  redness, tenderness, or signs of infection (pain, swelling, redness, odor or green/yellow discharge around incision site)   Complete by: As directed    Call MD for:  severe uncontrolled pain   Complete by: As directed    Call MD for:  temperature >101 F   Complete by: As directed    Diet bariatric full liquid   Complete by: As directed    Incentive spirometry   Complete by: As directed    Perform hourly while awake      Follow-up Information    Alphonsa Overall, MD. Go on 07/30/2019.   Specialty: General Surgery Why: at [address] information: [address] [place] [address] [phone]        Carlena Hurl, PA-C. Go on 08/27/2019.   Specialty: General Surgery Why: at [address] information: [place] [address] [phone]            Signed: Alphonsa Overall, M.D., [facility] Surgery Office:   [phone]  06/30/2019, 11:38 AM

## 2019-06-30 NOTE — Progress Notes (Signed)
[facility] Office:  [phone] General Surgery Progress Note   LOS: 1 day  POD -  1 Day Post-Op  Chief Complaint: Morbid obesity  Assessment and Plan: 1. LAPAROSCOPIC ROUX-EN-Y GASTRIC BYPASS WITH UPPER ENDOSCOPY - 06/29/2019  Morbid obesity (weight 241, BMI of 37.8)  Looks good, taking water only.  To start protein drinks.  Possibly home later today.  2.  DIABETES MELLITUS, INSULIN DEPENDENT (IDDM), CONTROLLED (E11.9)             Sees Dr. Dwyane Dee             Insulin dependent  She had not bought her diabetes supplies the last couple of weeks - she needs to get these out 3.  ESSENTIAL HYPERTENSION (I10) 4.  Quit smoking 5. Chronic knee pain 6. Dyslipidemia 7. She saw Myrtie Cruise for psych ([place]) 8. Bipolar disease She sees Special educational needs teacher and the [employer] She is about to see a new therapist - ? name 38.  DVT prophylaxis - Lovenox   Active Problems:   Morbid obesity ([place])  Subjective:  Did well last PM.  Taking po's.  Pain controlled.  Objective:   Vitals:   06/29/19 2357 06/30/19 0400  BP: 113/68 121/65  Pulse: 64 69  Resp: 16 16  Temp: 98 F (36.7 C) 99.3 F (37.4 C)  SpO2: 100% 100%     Intake/Output from previous day:  12/07 0701 - 12/08 0700 In: 3285 [P.O.:360; I.V.:2925] Out: 2020 [Urine:2000; Blood:20]  Intake/Output this shift:  No intake/output data recorded.   Physical Exam:   General: Obese AA F who is alert and oriented.    HEENT: Normal. Pupils equal. .   Lungs: Clear.  IS = 1,400 cc   Abdomen: Soft.  Rare BS.   Wound: Clean wounds   Lab Results:    Recent Labs    06/29/19 1438 06/30/19 0459  WBC  --  15.0*  HGB 13.3 12.2  HCT 39.7 36.9  PLT  --  309    BMET  No results for input(s): NA, K, CL, CO2, GLUCOSE, BUN, CREATININE, CALCIUM in the last 72 hours.  PT/INR  No results for input(s): LABPROT, INR in the last 72 hours.  ABG  No results for input(s): PHART, HCO3  in the last 72 hours.  Invalid input(s): PCO2, PO2   Studies/Results:  No results found.   Anti-infectives:   Anti-infectives (From admission, onward)   Start     Dose/Rate Route Frequency Ordered Stop   06/29/19 0600  cefoTEtan (CEFOTAN) 2 g in sodium chloride 0.9 % 100 mL IVPB     2 g 200 mL/hr over 30 Minutes Intravenous On call to O.R. 06/29/19 0531 06/29/19 [place], MD, [facility] Surgery Office: [phone] 06/30/2019

## 2019-06-30 NOTE — Progress Notes (Signed)
Patient alert and oriented, Post op day 1.  Provided support and encouragement.  Encouraged pulmonary toilet, ambulation and small sips of liquids.  Completed 12 ounces of bari clear fluids and 3 cups of protein.  All questions answered.  Will continue to monitor.

## 2019-06-30 NOTE — Telephone Encounter (Signed)
PA initiated via [email or web address] for Ozempic 0.5mg  once weekly.  Tanya Wilcox (KeyEarnest Bailey) Rx #: [id number] Ozempic (0.25 or 0.5 MG/DOSE) 2MG /1.5ML pen-injectors   Form [employer] Electronic Prior Authorization Form (2017 NCPDP) Created 7 hours ago Sent to Plan 3 minutes ago Plan Response 3 minutes ago Submit Clinical Questions less than a minute ago Determination Wait for Determination Please wait for [employer] 2017 NCPDP to return a determination.

## 2019-06-30 NOTE — Progress Notes (Signed)
Patient alert and oriented, pain is controlled. Patient is tolerating fluids, advanced to protein shake today, patient is tolerating well. Reviewed Gastric Bypass discharge instructions with patient and patient is able to articulate understanding. Provided information on BELT program, Support Group and [facility] outpatient pharmacy. All questions answered, will continue to monitor.   Total fluid intake 720 Per dehydration protocol call back one week post op 

## 2019-06-30 NOTE — Progress Notes (Signed)
Nutrition Brief Note  RD consulted for diet education for patient s/p bariatric surgery. Bariatric nurse coordinator providing education at this time.  If nutrition issues arise, please consult RD.   Clayton Bibles, MS, RD, LDN Inpatient Clinical Dietitian Pager: [phone] After Hours Pager: [phone]

## 2019-07-01 ENCOUNTER — Encounter: Payer: Self-pay | Admitting: Family

## 2019-07-01 ENCOUNTER — Other Ambulatory Visit: Payer: Self-pay

## 2019-07-01 LAB — GLUCOSE, CAPILLARY
Glucose-Capillary: 159 mg/dL — ABNORMAL HIGH (ref 70–99)
Glucose-Capillary: 144 mg/dL — ABNORMAL HIGH (ref 70–99)
Glucose-Capillary: 135 mg/dL — ABNORMAL HIGH (ref 70–99)

## 2019-07-01 MED ORDER — GLUCOSE BLOOD VI STRP: ORAL_STRIP | 2 refills | Status: DC

## 2019-07-01 MED ORDER — OZEMPIC (0.25 OR 0.5 MG/DOSE) 2 MG/1.5ML ~~LOC~~ SOPN: 0.5000 mg | PEN_INJECTOR | SUBCUTANEOUS | 2 refills | Status: DC

## 2019-07-01 NOTE — Telephone Encounter (Signed)
Received fax from [employer] stating that the PA was canceled because the request was previously approved  Through 05/31/2020.

## 2019-07-02 ENCOUNTER — Other Ambulatory Visit: Payer: Self-pay

## 2019-07-06 ENCOUNTER — Ambulatory Visit: Payer: Managed Care, Other (non HMO) | Admitting: Endocrinology

## 2019-07-06 ENCOUNTER — Encounter: Payer: Self-pay | Admitting: Endocrinology

## 2019-07-06 ENCOUNTER — Telehealth (HOSPITAL_COMMUNITY): Payer: Self-pay

## 2019-07-06 VITALS — BP 130/80 | HR 71 | Ht 67.0 in | Wt 237.8 lb

## 2019-07-06 DIAGNOSIS — E1169 Type 2 diabetes mellitus with other specified complication: Secondary | ICD-10-CM | POA: Diagnosis not present

## 2019-07-06 DIAGNOSIS — E669 Obesity, unspecified: Secondary | ICD-10-CM | POA: Diagnosis not present

## 2019-07-06 NOTE — Progress Notes (Signed)
Patient ID: Tanya Wilcox, female   DOB: 06-Jun-1971, 48 y.o.   MRN: [id number]          Reason for Appointment: Follow-up for Type 2 Diabetes   Referring PCP: Debbrah Alar   History of Present Illness:          Date of diagnosis of type 2 diabetes mellitus: 2007       Background history:   She does not remember the initial symptoms at diagnosis and level of blood sugar and no previous records available Her A1c was 7% in 2014 and as high as 9.9 in 2015 She has previously taken metformin, Amaryl, Invokana, Actos and Januvia with variable level of control Lowest A1c was 6.4 in 2017 She was started in  7/18 on basal insulin using Basaglar presumably for worsening blood sugar control  Recent history:   INSULIN regimen is: 3-4 Humalog as needed  Non-insulin hypoglycemic drugs the patient is taking are: None  Current management, blood sugar patterns and problems identified:  Her A1c was last higher at 7.7   However she has just had a gastric bypass surgery done on 12/7  She had run out of Invokana, [place] and Ozempic and on her last visit was told to start Humalog because of postprandial readings as high as 374  However since her surgery she has had much better blood sugars  This is without any significant treatment and she is only taking 3 to 4 units of Humalog compared to 15 to 20 units prior to her surgery  Her blood sugars are being checked several times a day  More recently blood sugars are running between 119 and 164  She is mostly on a liquid diet with protein currently  So far has lost 3 pounds in the last week  No complications from surgery        Side effects from medications have been: Nausea from 2000 mg of metformin  Compliance with the medical regimen: Variable Hypoglycemia:   None  Glucose monitoring:  done about 1 times a day         Glucometer: One Touch ultra 2.       Blood Glucose readings from monitor download:   PRE-MEAL Fasting Lunch  Dinner Bedtime Overall  Glucose range:  125-147  135-170  119-164  130-146   Mean/median:     140   Previous readings:   PRE-MEAL Fasting Lunch Dinner Bedtime Overall  Glucose range:  165-218  280, 252     Mean/median:      217   POST-MEAL PC Breakfast PC Lunch PC Dinner  Glucose range:  200, 374   219, 273  Mean/median:      PREVIOUS readings:  FASTING average around 148, Midday 135, afternoon 169 and evening about 140+  AVERAGE overall 144   Weight history:  Wt Readings from Last 3 Encounters:  07/06/19 237 lb 12.8 oz (107.9 kg)  06/29/19 240 lb (108.9 kg)  06/25/19 242 lb (109.8 kg)    Glycemic control:   Lab Results  Component Value Date   HGBA1C 6.7 (H) 06/25/2019   HGBA1C 7.7 (H) 04/27/2019   HGBA1C 7.0 (H) 01/26/2019   Lab Results  Component Value Date   MICROALBUR <0.7 12/03/2018   [place] 80 10/01/2018   CREATININE 0.78 06/25/2019   Lab Results  Component Value Date   MICRALBCREAT 1.2 12/03/2018    Lab Results  Component Value Date   FRUCTOSAMINE 277 12/03/2018      Allergies  as of 07/06/2019   No Known Allergies     Medication List       Accurate as of July 06, 2019 11:59 PM. If you have any questions, ask your nurse or doctor.        STOP taking these medications   canagliflozin 100 MG Tabs tablet Commonly known as: Invokana Stopped by: Elayne Snare, MD   ibuprofen 200 MG tablet Commonly known as: ADVIL Stopped by: Elayne Snare, MD   ondansetron 4 MG disintegrating tablet Commonly known as: ZOFRAN-ODT Stopped by: Elayne Snare, MD   oxyCODONE 5 MG immediate release tablet Commonly known as: Oxy IR/ROXICODONE Stopped by: Elayne Snare, MD   Ozempic (0.25 or 0.5 MG/DOSE) 2 MG/1.5ML Sopn Generic drug: Semaglutide(0.25 or 0.5MG/DOS) Stopped by: Elayne Snare, MD   Toujeo Max SoloStar 300 UNIT/ML Sopn Generic drug: Insulin Glargine (2 Unit Dial) Stopped by: Elayne Snare, MD     TAKE these medications   ALPRAZolam 0.5 MG  tablet Commonly known as: XANAX Take 0.5 mg by mouth 2 (two) times daily as needed for anxiety.   ARIPiprazole 5 MG tablet Commonly known as: ABILIFY Take 5 mg by mouth daily.   atorvastatin 40 MG tablet Commonly known as: LIPITOR Take 1 tablet (40 mg total) by mouth daily.   B-D ULTRAFINE III SHORT PEN 31G X 8 MM Misc Generic drug: Insulin Pen Needle Use to inject insulin 4 times daily.   BARIATRIC MULTIVITAMINS/IRON PO Take 2 tablets by mouth 2 (two) times daily.   CALCI-CHEW PO Take 1 tablet by mouth 3 (three) times daily. Chew one calci-chew three times daily.   glucose blood test strip Use OneTouch verio test strips as instructed to check blood sugar three times daily.   insulin lispro 100 UNIT/ML KwikPen Commonly known as: HumaLOG KwikPen ADMINISTER 10 TO 15 UNITS UNDER THE SKIN BEFORE MEALS What changed:   how much to take  how to take this  when to take this  additional instructions   levonorgestrel 20 MCG/24HR IUD Commonly known as: MIRENA 1 each by Intrauterine route once.   lisinopril 20 MG tablet Commonly known as: ZESTRIL TAKE 1 TABLET BY MOUTH DAILY   omeprazole 40 MG capsule Commonly known as: PRILOSEC Take 40 mg by mouth daily.   OneTouch Verio w/Device Kit USE TO TEST BLOOD SUGAR THREE TIMES DAILY   sertraline 25 MG tablet Commonly known as: ZOLOFT Take 25 mg by mouth daily.   valACYclovir 500 MG tablet Commonly known as: VALTREX Take 1 tablet (500 mg total) by mouth daily.   VITAMIN D (CHOLECALCIFEROL) PO Take 1 tablet by mouth daily.       Allergies:  No Known Allergies  Past Medical History:  Diagnosis Date  . Alcohol use   . Anxiety   . Bipolar disorder ([place])   . Depression   . Diabetes ([place])    TYPE 2   . Gestational diabetes    borderline II  . Osteoarthritis    knee, RIGHT   . Tobacco abuse     Past Surgical History:  Procedure Laterality Date  . CHOLECYSTECTOMY  2003  . GASTRIC ROUX-EN-Y N/A 06/29/2019    Procedure: LAPAROSCOPIC ROUX-EN-Y GASTRIC BYPASS WITH UPPER ENDOSCOPY, ERAS PATHWAY;  Surgeon: Alphonsa Overall, MD;  Location: [facility];  Service: General;  Laterality: N/A;    Family History  Problem Relation Age of Onset  . Hypertension Mother   . Hyperlipidemia Mother   . Alcohol abuse Father     Social History:  reports that she quit smoking about 2 weeks ago. Her smoking use included cigarettes. She has never used smokeless tobacco. She reports current alcohol use. She reports that she does not use drugs.   Review of Systems   Lipid history: On treatment by PCP with Lipitor 40 mg    Lab Results  Component Value Date   CHOL 136 10/01/2018   HDL 37 (L) 10/01/2018   LDLCALC 80 10/01/2018   TRIG 107 10/01/2018   CHOLHDL 3.7 10/01/2018           Hypertension: Treated with lisinopril 20 mg by PCP  BP Readings from Last 3 Encounters:  07/06/19 130/80  06/30/19 133/78  06/25/19 (!) 135/50    Most recent eye exam was in 12/2018, no retinopathy  Most recent foot exam: 10/2017   LABS:  Admission on 06/29/2019, Discharged on 06/30/2019  Component Date Value Ref Range Status  . Preg Test, Ur 06/29/2019 NEGATIVE  NEGATIVE Final   Comment:        THE SENSITIVITY OF THIS METHODOLOGY IS >20 mIU/mL. Performed at [facility], [place] [address], [place], [place] [address]   . Glucose-Capillary 06/29/2019 179* 70 - 99 mg/dL Final  . Glucose-Capillary 06/29/2019 209* 70 - 99 mg/dL Final  . Hemoglobin 06/29/2019 13.3  12.0 - 15.0 g/dL Final  . HCT 06/29/2019 39.7  36.0 - 46.0 % Final   Performed at [facility], [place] [address] [place], [place] [address]  . Glucose-Capillary 06/29/2019 225* 70 - 99 mg/dL Final  . WBC 06/30/2019 15.0* 4.0 - 10.5 K/uL Final  . RBC 06/30/2019 3.73* 3.87 - 5.11 MIL/uL Final  . Hemoglobin 06/30/2019 12.2  12.0 - 15.0 g/dL Final  . HCT 06/30/2019 36.9  36.0 - 46.0 % Final  . MCV 06/30/2019 98.9  80.0 - 100.0 fL Final   . MCH 06/30/2019 32.7  26.0 - 34.0 pg Final  . MCHC 06/30/2019 33.1  30.0 - 36.0 g/dL Final  . RDW 06/30/2019 11.3* 11.5 - 15.5 % Final  . Platelets 06/30/2019 309  150 - 400 K/uL Final  . nRBC 06/30/2019 0.0  0.0 - 0.2 % Final  . Neutrophils Relative % 06/30/2019 76  % Final  . Neutro Abs 06/30/2019 11.5* 1.7 - 7.7 K/uL Final  . Lymphocytes Relative 06/30/2019 15  % Final  . Lymphs Abs 06/30/2019 2.2  0.7 - 4.0 K/uL Final  . Monocytes Relative 06/30/2019 9  % Final  . Monocytes Absolute 06/30/2019 1.3* 0.1 - 1.0 K/uL Final  . Eosinophils Relative 06/30/2019 0  % Final  . Eosinophils Absolute 06/30/2019 0.0  0.0 - 0.5 K/uL Final  . Basophils Relative 06/30/2019 0  % Final  . Basophils Absolute 06/30/2019 0.0  0.0 - 0.1 K/uL Final  . Immature Granulocytes 06/30/2019 0  % Final  . Abs Immature Granulocytes 06/30/2019 0.06  0.00 - 0.07 K/uL Final   Performed at [facility], [place] [address], [place], [place] [address]  . Glucose-Capillary 06/29/2019 200* 70 - 99 mg/dL Final  . Glucose-Capillary 06/29/2019 199* 70 - 99 mg/dL Final  . Glucose-Capillary 06/30/2019 161* 70 - 99 mg/dL Final  . Glucose-Capillary 06/30/2019 159* 70 - 99 mg/dL Final  . Glucose-Capillary 06/30/2019 144* 70 - 99 mg/dL Final  . Glucose-Capillary 06/30/2019 135* 70 - 99 mg/dL Final    Physical Examination:  BP 130/80 (BP Location: Left Arm, Patient Position: Sitting, Cuff Size: Normal)   Pulse 71   Ht '5\' 7"'  (  1.702 m)   Wt 237 lb 12.8 oz (107.9 kg)   SpO2 96%   BMI 37.24 kg/m          ASSESSMENT:  Diabetes type 2, BMI 38  See history of present illness for detailed discussion of current diabetes management, blood sugar patterns and problems identified   Her A1c was last 7.7  Her blood sugars are only mildly increased since her gastric bypass surgery with practically no treatment She is only on minimal doses of Humalog, previously on Toujeo, Ozempic and Invokana  Discussed that  since she just had her gastric bypass surgery and is not appearing to be insulin deficient does not need to be treated at this time with her blood sugars mostly in the 130-140 range throughout the day   PLAN:    Monitor blood sugars 2-3 times a day at various times Consider going back on Invokana if she has consistently high readings but do not think she needs to be on insulin now However most likely with her continued weight loss her blood sugars should improve further  Encouraged her to continue following the diet instructions after gastric bypass surgery Also since she is starting to do a little walking she will continue to increase this  There are no Patient Instructions on file for this visit.      Elayne Snare 07/07/2019, 10:53 AM   Note: This office note was prepared with Dragon voice recognition system technology. Any transcriptional errors that result from this process are unintentional.

## 2019-07-06 NOTE — Telephone Encounter (Signed)
Patient called to discuss post bariatric surgery follow up questions.  See below:   1.  Tell me about your pain and pain management?denies  2.  Let's talk about fluid intake.  How much total fluid are you taking in?74 ounces  3.  How much protein have you taken in the last 2 days?60 grams  4.  Have you had nausea?  Tell me about when have experienced nausea and what you did to help?denies  5.  Has the frequency or color changed with your urine?light in color  6.  Tell me what your incisions look like?wnl   7.  Have you been passing gas? BM?yes had BM, can use miralax  8.  If a problem or question were to arise who would you call?  Do you know contact numbers for [facility], [facility], and [facility]?aware of how to contact all services  9.  How has the walking going?walking regularly around the house  10.  How are your vitamins and calcium going?  How are you taking them?mvi/calcium no problems  No questions sees endocrinologist today

## 2019-07-14 ENCOUNTER — Other Ambulatory Visit: Payer: Self-pay

## 2019-07-14 ENCOUNTER — Encounter: Payer: Managed Care, Other (non HMO) | Attending: General Surgery | Admitting: Skilled Nursing Facility1

## 2019-07-14 DIAGNOSIS — E669 Obesity, unspecified: Secondary | ICD-10-CM | POA: Insufficient documentation

## 2019-07-14 MED ORDER — ATORVASTATIN CALCIUM 40 MG PO TABS: 40.0000 mg | ORAL_TABLET | Freq: Every day | ORAL | 1 refills | Status: DC

## 2019-07-15 NOTE — Progress Notes (Signed)
2 Week Post-Operative Nutrition Class   Patient was seen on 09/16/18 for Post-Operative Nutrition education at [facility].    Surgery date: 06/29/2019 Surgery type: RYG Start weight at [facility]: 245.2 Weight today: pt declined   Body Composition Scale Date  Total Body Fat %   Visceral Fat   Fat-Free Mass %    Total Body Water %    Muscle-Mass lbs   Body Fat Displacement          Torso  lbs          Left Leg  lbs          Right Leg  lbs          Left Arm  lbs          Right Arm   lbs      The following the learning objectives were met by the patient during this course:  Identifies Phase 3 (Soft, High Proteins) Dietary Goals and will begin from 2 weeks post-operatively to 2 months post-operatively  Identifies appropriate sources of fluids and proteins   States protein recommendations and appropriate sources post-operatively  Identifies the need for appropriate texture modifications, mastication, and bite sizes when consuming solids  Identifies appropriate multivitamin and calcium sources post-operatively  Describes the need for physical activity post-operatively and will follow MD recommendations  States when to call healthcare provider regarding medication questions or post-operative complications   Handouts given during class include:  Phase 3A: Soft, High Protein Diet Handout   Follow-Up Plan: Patient will follow-up at [facility] in 6 weeks for 2 month post-op nutrition visit for diet advancement per MD.   

## 2019-07-20 ENCOUNTER — Telehealth: Payer: Self-pay | Admitting: Skilled Nursing Facility1

## 2019-07-20 NOTE — Telephone Encounter (Signed)
RD called pt to verify fluid intake once starting soft, solid proteins 2 week post-bariatric surgery.   Daily Fluid intake: 48 oz a day (pt doesn't feel she has enough room, but is having 2 sugar free popsicles at night) Daily Protein intake: Close to 60g, 50-64 g  Concerns/issues:   Pt reports its going ok, can't quite get down 3 oz a sitting. Getting about 2 oz at a time.  Pt reports shrimp didn't sit well, vomited.  Only vomited twice, other time was because she ate too fast.  Pt reports a burning sensation that came and went over christmas (about 2 days). Advised pt to call her surgeon if it returns.

## 2019-07-23 ENCOUNTER — Other Ambulatory Visit: Payer: Self-pay | Admitting: *Deleted

## 2019-07-23 MED ORDER — LISINOPRIL 20 MG PO TABS: 20.0000 mg | ORAL_TABLET | Freq: Every day | ORAL | 1 refills | Status: DC

## 2019-08-25 ENCOUNTER — Encounter: Payer: Managed Care, Other (non HMO) | Attending: General Surgery | Admitting: Skilled Nursing Facility1

## 2019-08-25 ENCOUNTER — Other Ambulatory Visit: Payer: Self-pay

## 2019-08-25 DIAGNOSIS — E1165 Type 2 diabetes mellitus with hyperglycemia: Secondary | ICD-10-CM

## 2019-08-25 DIAGNOSIS — E669 Obesity, unspecified: Secondary | ICD-10-CM | POA: Diagnosis present

## 2019-08-25 NOTE — Progress Notes (Signed)
Bariatric Nutrition Follow-Up Visit Medical Nutrition Therapy   NUTRITION ASSESSMENT   Surgery: 06/29/2019  Anthropometrics  Start weight at [facility]: 245.2 lbs (date: 03/29/2019) Today's weight: 219.6 lbs  Body Composition Scale Date  Weight  lbs 219.6  Total Body Fat  % 40.2     Visceral Fat 12  Fat-Free Mass  % 59.7     Total Body Water  % 44.3     Muscle-Mass  lbs 33.5  BMI 34.1  Body Fat Displacement ---        Torso  lbs 54.6        Left Leg  lbs 10.9        Right Leg  lbs 10.9        Left Arm  lbs 5.4        Right Arm  lbs 5.4   Clinical  Medical hx: HTN, GERD, DM type 2, bipolar Medications: zoloft, abilify  Labs:    Lifestyle & Dietary Hx   Pt is no longer taking insulin. Pt states fish and shrimp hurts her stomach. Pt states she feels like her foods are lumps in her stomach which interferes with eating with pleasure. Pt states she realizes she is not chewing well enough. She may be taking bites bigger than the recommended bite size which could help explain the sensation of a lump in her stomach. Pt state she checks her blood sugar 2 times a day: 70's, 140. She notes that she tends to drink more water in short periods of time in an effort to help meet recommended fluid intake. This may interfere with her desire to eat regular meals. Pt reports using supplements to meet protein needs and taking vitamins to meet micronutrient needs. Pt employment requires sitting most of the time with 3 breaks. Pt uses alarms to remind her to take vitamins.   Estimated daily fluid intake: 40.9 oz Estimated daily protein intake: 60+ g Supplements: inappropriate multi and 2 calcium Current average weekly physical activity: 60 minutes walking videos   24-Hr Dietary Recall First Meal: decaf coffee protein shake Snack: pork skins Second Meal 1:30: 3 chicken wings in ranch or regular BBQ Snack: sugar free popcicle  Third Meal: 1-2 chicken wings  Snack:  Beverages: decaf coffee 12 ounces,  1 16.9 ounces water, 12 ounce zero juice   Post-Op Goals/ Signs/ Symptoms Using straws: no Drinking while eating: no Chewing/swallowing difficulties: no Changes in vision: no Changes to mood/headaches: no Hair loss/changes to skin/nails: no Difficulty focusing/concentrating: no Sweating: no Dizziness/lightheadedness: no Palpitations: no  Carbonated/caffeinated beverages: no N/V/D/C/Gas: movement once every 3+ days  Abdominal pain: no Dumping syndrome: no    NUTRITION DIAGNOSIS  Overweight/obesity ([place]-3.3) related to past poor dietary habits and physical inactivity as evidenced by completed bariatric surgery and following dietary guidelines for continued weight loss and healthy nutrition status.     NUTRITION INTERVENTION Nutrition counseling (C-1) and education (E-2) to facilitate bariatric surgery goals, including: . Diet advancement to the next phase (phase 4) now including non starchy  vegetables . The importance of consuming adequate calories as well as certain nutrients daily due to the body's need for essential vitamins, minerals, and fats . The importance of daily physical activity and to reach a goal of at least 150 minutes of moderate to vigorous physical activity weekly (or as directed by their physician) due to benefits such as increased musculature and improved lab values . The importance of intuitive eating specifically learning hunger-satiety cues and understanding the importance  of learning a new body  Goals: -Switch to more appropriate multi when ou of current  -Aim to get up every 45 minutes to an hour for more water/pee break  -Continue to aim for a minimum of 64 fluid ounces 7 days a week with at least 30 ounces being plain water -Eat non-starchy vegetables 2 times a day 7 days a week -Start out with soft cooked vegetables today and tomorrow; if tolerated begin to eat raw vegetables or cooked including salads -Eat your 3 ounces of protein first then start in on  your non-starchy vegetables; once you understand how much of your meal leads to satisfaction and not full while still eating 3 ounces of protein and non-starchy vegetables you can eat them in any order  -Continue to aim for 30 minutes of activity at least 5 times a week -Do NOT cook with/add to your food: alfredo sauce, cheese sauce, barbeque sauce, ketchup, fat back, butter, bacon grease, grease, Crisco  -Aim to follow these suggestion for improved eating experience: sit-up straight, dime-sized bites, chewing to meet appropriate consistency, moisten foods   Handouts Provided Include   Non starchy vegetables   Learning Style & Readiness for Change Teaching method utilized: Visual & Auditory  Demonstrated degree of understanding via: Teach Back  Barriers to learning/adherence to lifestyle change: learning curve   RD's Notes for Next Visit . Assess adherence to pt chosen goals    MONITORING & EVALUATION Dietary intake, weekly physical activity, body weight  Next Steps Patient is to follow-up in  May

## 2019-09-08 ENCOUNTER — Other Ambulatory Visit (INDEPENDENT_AMBULATORY_CARE_PROVIDER_SITE_OTHER): Payer: Managed Care, Other (non HMO)

## 2019-09-08 ENCOUNTER — Other Ambulatory Visit: Payer: Self-pay

## 2019-09-08 DIAGNOSIS — E669 Obesity, unspecified: Secondary | ICD-10-CM

## 2019-09-08 DIAGNOSIS — Z794 Long term (current) use of insulin: Secondary | ICD-10-CM | POA: Diagnosis not present

## 2019-09-08 DIAGNOSIS — E1169 Type 2 diabetes mellitus with other specified complication: Secondary | ICD-10-CM | POA: Diagnosis not present

## 2019-09-08 DIAGNOSIS — E1165 Type 2 diabetes mellitus with hyperglycemia: Secondary | ICD-10-CM

## 2019-09-08 LAB — COMPREHENSIVE METABOLIC PANEL
ALT: 33 U/L (ref 0–35)
AST: 19 U/L (ref 0–37)
Albumin: 4.1 g/dL (ref 3.5–5.2)
Alkaline Phosphatase: 65 U/L (ref 39–117)
BUN: 14 mg/dL (ref 6–23)
CO2: 27 mEq/L (ref 19–32)
Calcium: 9.3 mg/dL (ref 8.4–10.5)
Chloride: 105 mEq/L (ref 96–112)
Creatinine, Ser: 0.77 mg/dL (ref 0.40–1.20)
GFR: 96.62 mL/min (ref >60.00)
Glucose, Bld: 122 mg/dL — ABNORMAL HIGH (ref 70–99)
Potassium: 4.3 mEq/L (ref 3.5–5.1)
Sodium: 138 mEq/L (ref 135–145)
Total Bilirubin: 0.3 mg/dL (ref 0.2–1.2)
Total Protein: 7 g/dL (ref 6.0–8.3)

## 2019-09-08 LAB — HEMOGLOBIN A1C: Hgb A1c MFr Bld: 6.9 % — ABNORMAL HIGH (ref 4.6–6.5)

## 2019-09-09 LAB — FRUCTOSAMINE: Fructosamine: 240 umol/L (ref 0–285)

## 2019-09-11 ENCOUNTER — Encounter: Payer: Self-pay | Admitting: Family

## 2019-09-11 ENCOUNTER — Ambulatory Visit (INDEPENDENT_AMBULATORY_CARE_PROVIDER_SITE_OTHER): Payer: Managed Care, Other (non HMO) | Admitting: Endocrinology

## 2019-09-11 ENCOUNTER — Other Ambulatory Visit: Payer: Self-pay

## 2019-09-11 ENCOUNTER — Encounter: Payer: Self-pay | Admitting: Endocrinology

## 2019-09-11 DIAGNOSIS — E1169 Type 2 diabetes mellitus with other specified complication: Secondary | ICD-10-CM

## 2019-09-11 DIAGNOSIS — I10 Essential (primary) hypertension: Secondary | ICD-10-CM

## 2019-09-11 DIAGNOSIS — E669 Obesity, unspecified: Secondary | ICD-10-CM | POA: Diagnosis not present

## 2019-09-11 DIAGNOSIS — E119 Type 2 diabetes mellitus without complications: Secondary | ICD-10-CM

## 2019-09-11 MED ORDER — VALACYCLOVIR HCL 500 MG PO TABS: 500.0000 mg | ORAL_TABLET | Freq: Every day | ORAL | 0 refills | Status: DC

## 2019-09-11 MED ORDER — GLUCOSE BLOOD VI STRP: ORAL_STRIP | 2 refills | Status: DC

## 2019-09-11 NOTE — Progress Notes (Signed)
Patient ID: Tanya Wilcox, female   DOB: [birth year], 49 y.o.   MRN: [id number]          Reason for Appointment: Follow-up for Type 2 Diabetes  I connected with the above-named patient by video enabled telemedicine application and verified that I am speaking with the correct person. The patient was explained the limitations of evaluation and management by telemedicine and the availability of in person appointments.  Patient also understood that there may be a patient responsible charge related to this service . Location of the patient: Patient's home . Location of the provider: Physician office Only the patient and myself were participating in the encounter The patient understood the above statements and agreed to proceed.   Referring PCP: Debbrah Alar   History of Present Illness:          Date of diagnosis of type 2 diabetes mellitus: 2007       Background history:   She does not remember the initial symptoms at diagnosis and level of blood sugar and no previous records available Her A1c was 7% in 2014 and as high as 9.9 in 2015 She has previously taken metformin, Amaryl, Invokana, Actos and Januvia with variable level of control Lowest A1c was 6.4 in 2017 She was started in  7/18 on basal insulin using Basaglar presumably for worsening blood sugar control  Recent history:   INSULIN regimen is: None  Non-insulin hypoglycemic drugs the patient is taking are: None  Current management, blood sugar patterns and problems identified:  Her A1c was last improved at 6.7 and now is 6.9  Fructosamine is 240   She had a gastric bypass surgery done on 06/29/19  She has lost over 30 pounds total since then  Although her blood sugars were not optimal on her last visit in December they were coming down without any medications and no specific treatment was started  Previously on Invokana, Toujeo and Ozempic and Humalog  She generally been able to keep her portions down since her  surgery and frequently having some protein shakes  As before since her surgery she has had much better blood sugars, previously as high as 374  Home blood sugars recently are generally near normal with a rare high blood sugar of 195 nonfasting  Average is under 120 for the last 30 days 100 she has not checked her blood sugars since 2/11 because of lack of strips  No complications from surgery but may occasionally have abdominal discomfort with eating  She has done some exercise videos also  Overall has been pleased with her improvement and is feeling better also        Side effects from medications have been: Nausea from 2000 mg of metformin  Compliance with the medical regimen: Variable Hypoglycemia:   None  Glucose monitoring:  done about 1 times a day         Glucometer: One Touch ultra 2.       Blood Glucose readings from monitor as follows, 14-day average = 154 and 30-day average = 118, 90-day 137  Feb11 11:15pm 195 Feb5 11:26pm 113 Feb4 9:13pm 92 Feb 3 7:45am 125 Feb 2 11:27 pm 94 Feb 2 9:45am 119 Feb 1 11:42 pm 96  PREVIOUS readings:  PRE-MEAL Fasting Lunch Dinner Bedtime Overall  Glucose range:  125-147  135-170  119-164  130-146   Mean/median:     140      Weight history:  Wt Readings from Last 3 Encounters:  08/25/19  219 lb 9.6 oz (99.6 kg)  07/06/19 237 lb 12.8 oz (107.9 kg)  06/29/19 240 lb (108.9 kg)    Glycemic control:   Lab Results  Component Value Date   HGBA1C 6.9 (H) 09/08/2019   HGBA1C 6.7 (H) 06/25/2019   HGBA1C 7.7 (H) 04/27/2019   Lab Results  Component Value Date   MICROALBUR <0.7 12/03/2018   LDLCALC 80 10/01/2018   CREATININE 0.77 09/08/2019   Lab Results  Component Value Date   MICRALBCREAT 1.2 12/03/2018    Lab Results  Component Value Date   FRUCTOSAMINE 240 09/08/2019   FRUCTOSAMINE 277 12/03/2018      Allergies as of 09/11/2019   No Known Allergies     Medication List       Accurate as of September 11, 2019 10:16 AM. If you have any questions, ask your nurse or doctor.        ALPRAZolam 0.5 MG tablet Commonly known as: XANAX Take 0.5 mg by mouth 2 (two) times daily as needed for anxiety.   ARIPiprazole 5 MG tablet Commonly known as: ABILIFY Take 5 mg by mouth daily.   atorvastatin 40 MG tablet Commonly known as: LIPITOR Take 1 tablet (40 mg total) by mouth daily.   B-D ULTRAFINE III SHORT PEN 31G X 8 MM Misc Generic drug: Insulin Pen Needle Use to inject insulin 4 times daily.   BARIATRIC MULTIVITAMINS/IRON PO Take 2 tablets by mouth 2 (two) times daily.   CALCI-CHEW PO Take 1 tablet by mouth 3 (three) times daily. Chew one calci-chew three times daily.   glucose blood test strip Use OneTouch verio test strips as instructed to check blood sugar three times daily.   insulin lispro 100 UNIT/ML KwikPen Commonly known as: HumaLOG KwikPen ADMINISTER 10 TO 15 UNITS UNDER THE SKIN BEFORE MEALS What changed:   how much to take  how to take this  when to take this  additional instructions   levonorgestrel 20 MCG/24HR IUD Commonly known as: MIRENA 1 each by Intrauterine route once.   lisinopril 20 MG tablet Commonly known as: ZESTRIL Take 1 tablet (20 mg total) by mouth daily.   omeprazole 40 MG capsule Commonly known as: PRILOSEC Take 40 mg by mouth daily.   OneTouch Verio w/Device Kit USE TO TEST BLOOD SUGAR THREE TIMES DAILY   sertraline 25 MG tablet Commonly known as: ZOLOFT Take 25 mg by mouth daily.   valACYclovir 500 MG tablet Commonly known as: VALTREX Take 1 tablet (500 mg total) by mouth daily.   VITAMIN D (CHOLECALCIFEROL) PO Take 1 tablet by mouth daily.       Allergies:  No Known Allergies  Past Medical History:  Diagnosis Date  . Alcohol use   . Anxiety   . Bipolar disorder ([place])   . Depression   . Diabetes ([place])    TYPE 2   . Gestational diabetes    borderline II  . Osteoarthritis    knee, RIGHT   . Tobacco abuse     Past  Surgical History:  Procedure Laterality Date  . CHOLECYSTECTOMY  2003  . GASTRIC ROUX-EN-Y N/A 06/29/2019   Procedure: LAPAROSCOPIC ROUX-EN-Y GASTRIC BYPASS WITH UPPER ENDOSCOPY, ERAS PATHWAY;  Surgeon: Alphonsa Overall, MD;  Location: [facility];  Service: General;  Laterality: N/A;    Family History  Problem Relation Age of Onset  . Hypertension Mother   . Hyperlipidemia Mother   . Alcohol abuse Father     Social History:  reports that  she quit smoking about 2 months ago. Her smoking use included cigarettes. She has never used smokeless tobacco. She reports current alcohol use. She reports that she does not use drugs.   Review of Systems   Lipid history: On treatment by PCP with Lipitor 40 mg    Lab Results  Component Value Date   CHOL 136 10/01/2018   HDL 37 (L) 10/01/2018   LDLCALC 80 10/01/2018   TRIG 107 10/01/2018   CHOLHDL 3.7 10/01/2018           Hypertension: was Treated with lisinopril 20 mg by PCP, not on medications currently but her blood pressure at home this week was 165/96  BP Readings from Last 3 Encounters:  07/06/19 130/80  06/30/19 133/78  06/25/19 (!) 135/50    Most recent eye exam was in 12/2018, no retinopathy  Most recent foot exam: 10/2017   LABS:  Lab on 09/08/2019  Component Date Value Ref Range Status  . Hgb A1c MFr Bld 09/08/2019 6.9* 4.6 - 6.5 % Final   Glycemic Control Guidelines for People with Diabetes:Non Diabetic:  <6%Goal of Therapy: <7%Additional Action Suggested:  >8%   . Sodium 09/08/2019 138  135 - 145 mEq/L Final  . Potassium 09/08/2019 4.3  3.5 - 5.1 mEq/L Final  . Chloride 09/08/2019 105  96 - 112 mEq/L Final  . CO2 09/08/2019 27  19 - 32 mEq/L Final  . Glucose, Bld 09/08/2019 122* 70 - 99 mg/dL Final  . BUN 09/08/2019 14  6 - 23 mg/dL Final  . Creatinine, Ser 09/08/2019 0.77  0.40 - 1.20 mg/dL Final  . Total Bilirubin 09/08/2019 0.3  0.2 - 1.2 mg/dL Final  . Alkaline Phosphatase 09/08/2019 65  39 - 117 U/L Final  . AST  09/08/2019 19  0 - 37 U/L Final  . ALT 09/08/2019 33  0 - 35 U/L Final  . Total Protein 09/08/2019 7.0  6.0 - 8.3 g/dL Final  . Albumin 09/08/2019 4.1  3.5 - 5.2 g/dL Final  . GFR 09/08/2019 96.62  >60.00 mL/min Final  . Calcium 09/08/2019 9.3  8.4 - 10.5 mg/dL Final  . Fructosamine 09/08/2019 240  0 - 285 umol/L Final   Comment: Published reference interval for apparently healthy subjects between age 67 and 3 is 57 - 285 umol/L and in a poorly controlled diabetic population is 228 - 563 umol/L with a mean of 396 umol/L.     Physical Examination:  There were no vitals taken for this visit.         ASSESSMENT:  Diabetes type 2, previously insulin-dependent  See history of present illness for detailed discussion of current diabetes management, blood sugar patterns and problems identified   Her A1c is slightly higher at 6.9 but fructosamine is 240 which is reasonably good  Her blood sugars are only mildly increased since her gastric bypass surgery Overall however blood sugars are still better than after her post surgery visit This is with no medications She continues to lose weight and has lost a total of 32 pounds Diet has been excellent with portion control and cutting back on simple sugars and carbohydrates Also trying to exercise at home Currently fasting blood sugars are not below 100 still indicating insulin resistance  History of HYPERTENSION: This has been followed by her PCP and she has only 1 recent reading from home   PLAN:    Monitor blood sugars 2-3 times a day at various times Encouraged her to continue her excellent  diet regimen and keep regular exercise  No treatment for diabetes necessary at this time  Consider going back on lisinopril for blood pressure if blood pressure is consistently high She is supposed to see her PCP next week and would be preferable if she does in person visit  She will call if blood sugars are not consistently  improving  There are no Patient Instructions on file for this visit.      Elayne Snare 09/11/2019, 10:16 AM   Note: This office note was prepared with Dragon voice recognition system technology. Any transcriptional errors that result from this process are unintentional.

## 2019-09-16 ENCOUNTER — Other Ambulatory Visit: Payer: Self-pay

## 2019-09-18 ENCOUNTER — Encounter: Payer: Self-pay | Admitting: Family

## 2019-09-18 ENCOUNTER — Telehealth: Payer: Self-pay | Admitting: Family

## 2019-09-18 ENCOUNTER — Other Ambulatory Visit: Payer: Self-pay

## 2019-09-18 ENCOUNTER — Ambulatory Visit: Payer: Managed Care, Other (non HMO) | Admitting: Family

## 2019-09-18 VITALS — BP 116/48 | HR 66 | Temp 96.4°F | Resp 16 | Ht 67.0 in | Wt 210.0 lb

## 2019-09-18 DIAGNOSIS — I10 Essential (primary) hypertension: Secondary | ICD-10-CM | POA: Diagnosis not present

## 2019-09-18 DIAGNOSIS — E669 Obesity, unspecified: Secondary | ICD-10-CM

## 2019-09-18 DIAGNOSIS — E785 Hyperlipidemia, unspecified: Secondary | ICD-10-CM

## 2019-09-18 DIAGNOSIS — E1165 Type 2 diabetes mellitus with hyperglycemia: Secondary | ICD-10-CM | POA: Diagnosis not present

## 2019-09-18 DIAGNOSIS — F3170 Bipolar disorder, currently in remission, most recent episode unspecified: Secondary | ICD-10-CM

## 2019-09-18 DIAGNOSIS — Z87891 Personal history of nicotine dependence: Secondary | ICD-10-CM

## 2019-09-18 LAB — LIPID PANEL
Cholesterol: 109 mg/dL (ref 0–200)
HDL: 27.9 mg/dL — ABNORMAL LOW (ref >39.00)
LDL Cholesterol: 67 mg/dL (ref 0–99)
NonHDL: 81.31
Total CHOL/HDL Ratio: 4
Triglycerides: 73 mg/dL (ref 0.0–149.0)
VLDL: 14.6 mg/dL (ref 0.0–40.0)

## 2019-09-18 MED ORDER — LISINOPRIL 2.5 MG PO TABS: 2.5000 mg | ORAL_TABLET | Freq: Every day | ORAL | 1 refills | Status: DC

## 2019-09-18 MED ORDER — VALACYCLOVIR HCL 500 MG PO TABS: 500.0000 mg | ORAL_TABLET | Freq: Every day | ORAL | 1 refills | Status: DC

## 2019-09-18 NOTE — Telephone Encounter (Signed)
Records release form faxed to Molli Posey at 45 for woman

## 2019-09-18 NOTE — Progress Notes (Signed)
Subjective:    Patient ID: Tanya Wilcox, female    DOB: June 17, 1971, 49 y.o.   MRN: [id number]  HPI  Patient is a 49 yr old female who presents today for follow up.  She had a gastric bypass back in December 2020. She reports that her recovery was smooth. She recently was placed on protonix per her bariatric surgeon and reports that this has helped with the mild nausea that she was experiencing.   Wt Readings from Last 3 Encounters:  08/25/19 219 lb 9.6 oz (99.6 kg)  07/06/19 237 lb 12.8 oz (107.9 kg)  06/29/19 240 lb (108.9 kg)   HTN- previously on lisinopril 88m. Not taking currrently.  BP Readings from Last 3 Encounters:  07/06/19 130/80  06/30/19 133/78  06/25/19 (!) 135/50   Hyperlipidemia- maintained on atorvastatin 432m    Lab Results  Component Value Date   CHOL 136 10/01/2018   HDL 37 (L) 10/01/2018   LDLCALC 80 10/01/2018   TRIG 107 10/01/2018   CHOLHDL 3.7 10/01/2018   DM2- she is followed by endocrinology.  Lab Results  Component Value Date   HGBA1C 6.9 (H) 09/08/2019   HGBA1C 6.7 (H) 06/25/2019   HGBA1C 7.7 (H) 04/27/2019   Lab Results  Component Value Date   MICROALBUR <0.7 12/03/2018   LDGandy0 10/01/2018   CREATININE 0.77 09/08/2019   Bipolar disorder- managed by psychiatry.  Review of Systems    see HPI  Past Medical History:  Diagnosis Date  . Alcohol use   . Anxiety   . [place]  . Depression   . Diabetes ([place]   TYPE 2   . Gestational diabetes    borderline II  . Osteoarthritis    knee, RIGHT   . Tobacco abuse      Social History   Socioeconomic History  . Marital status: Single    Spouse name: Not on file  . Number of children: Not on file  . Years of education: Not on file  . Highest education level: Not on file  Occupational History  . Occupation: [employer]  Tobacco Use  . Smoking status: Former Smoker    Types: Cigarettes    Quit date: 06/21/2019    Years since quitting:  0.2  . Smokeless tobacco: Never Used  . Tobacco comment: 1 pack per day  Substance and Sexual Activity  . Alcohol use: Yes    Alcohol/week: 0.0 standard drinks    Comment: occasionally  . Drug use: No  . Sexual activity: Not on file  Other Topics Concern  . Not on file  Social History Narrative   Lives with daughter   Works at [employer]   Enjoys sleeping   1 dog   Completed high school, some college   Social Determinants of Health   Financial Resource Strain:   . Difficulty of Paying Living Expenses: Not on file  Food Insecurity:   . Worried About RuCharity fundraisern the Last Year: Not on file  . Ran Out of Food in the Last Year: Not on file  Transportation Needs:   . Lack of Transportation (Medical): Not on file  . Lack of Transportation (Non-Medical): Not on file  Physical Activity:   . Days of Exercise per Week: Not on file  . Minutes of Exercise per Session: Not on file  Stress:   . Feeling of Stress : Not on file  Social  Connections:   . Frequency of Communication with Friends and Family: Not on file  . Frequency of Social Gatherings with Friends and Family: Not on file  . Attends Religious Services: Not on file  . Active Member of Clubs or Organizations: Not on file  . Attends Archivist Meetings: Not on file  . Marital Status: Not on file  Intimate Partner Violence:   . Fear of Current or Ex-Partner: Not on file  . Emotionally Abused: Not on file  . Physically Abused: Not on file  . Sexually Abused: Not on file    Past Surgical History:  Procedure Laterality Date  . CHOLECYSTECTOMY  2003  . GASTRIC ROUX-EN-Y N/A 06/29/2019   Procedure: LAPAROSCOPIC ROUX-EN-Y GASTRIC BYPASS WITH UPPER ENDOSCOPY, ERAS PATHWAY;  Surgeon: Alphonsa Overall, MD;  Location: [facility];  Service: General;  Laterality: N/A;    Family History  Problem Relation Age of Onset  . Hypertension Mother   . Hyperlipidemia Mother   . Alcohol abuse Father     No  Known Allergies  Current Outpatient Medications on File Prior to Visit  Medication Sig Dispense Refill  . ALPRAZolam (XANAX) 0.5 MG tablet Take 0.5 mg by mouth 2 (two) times daily as needed for anxiety.   1  . ARIPiprazole (ABILIFY) 5 MG tablet Take 5 mg by mouth daily.   1  . atorvastatin (LIPITOR) 40 MG tablet Take 1 tablet (40 mg total) by mouth daily. 90 tablet 1  . Blood Glucose Monitoring Suppl (ONETOUCH VERIO) w/Device KIT USE TO TEST BLOOD SUGAR THREE TIMES DAILY 1 kit 0  . Calcium Carbonate (CALCI-CHEW PO) Take 1 tablet by mouth 3 (three) times daily. Chew one calci-chew three times daily.    Marland Kitchen levonorgestrel (MIRENA) 20 MCG/24HR IUD 1 each by Intrauterine route once.    . Multiple Vitamins-Minerals (BARIATRIC MULTIVITAMINS/IRON PO) Take 2 tablets by mouth 2 (two) times daily.    . pantoprazole (PROTONIX) 20 MG tablet Take 20 mg by mouth daily.    . sertraline (ZOLOFT) 25 MG tablet Take 25 mg by mouth daily.  1  . VITAMIN D, CHOLECALCIFEROL, PO Take 1 tablet by mouth daily.      No current facility-administered medications on file prior to visit.    BP (!) 116/48 (BP Location: Right Arm, Patient Position: Sitting, Cuff Size: Large)   Pulse 66   Temp (!) 96.4 F (35.8 C) (Temporal)   Resp 16   Ht _0  (1.702 m)   Wt 210 lb (95.3 kg)   SpO2 100%   BMI 32.89 kg/m    Objective:   Physical Exam Constitutional:      Appearance: She is well-developed.  Neck:     Thyroid: No thyromegaly.  Cardiovascular:     Rate and Rhythm: Normal rate and regular rhythm.     Heart sounds: Normal heart sounds. No murmur.  Pulmonary:     Effort: Pulmonary effort is normal. No respiratory distress.     Breath sounds: Normal breath sounds. No wheezing.  Musculoskeletal:     Cervical back: Neck supple.  Skin:    General: Skin is warm and dry.  Neurological:     Mental Status: She is alert and oriented to person, place, and time.  Psychiatric:        Behavior: Behavior normal.         Thought Content: Thought content normal.        Judgment: Judgment normal.  Assessment & Plan:  HTN- bp looks great- will initiate lisinopril 2.3m for renal protection only.   DM2- A1C at goal, add ace as above.  Management of DM per endo.  Bipolar disorder- stable, management per psychiatry.  Hyperlipidemia- maintained on statin. Will obtain updated lipid panel.  Hx of tobacco abuse- she has quit smoking. I commended her on this.   Obesity- she is down 21 pounds following bariatric surgery.   This visit occurred during the SARS-CoV-2 public health emergency.  Safety protocols were in place, including screening questions prior to the visit, additional usage of staff PPE, and extensive cleaning of exam room while observing appropriate contact time as indicated for disinfecting solutions.

## 2019-09-18 NOTE — Patient Instructions (Signed)
Please complete lab work prior to leaving.   

## 2019-09-18 NOTE — Telephone Encounter (Signed)
Please call Dr. Delanna Ahmadi office to request copy of mammogram.

## 2019-10-16 ENCOUNTER — Ambulatory Visit: Payer: Managed Care, Other (non HMO) | Attending: Internal Medicine

## 2019-10-16 DIAGNOSIS — Z23 Encounter for immunization: Secondary | ICD-10-CM

## 2019-10-16 NOTE — Progress Notes (Signed)
   Covid-19 Vaccination Clinic  Name:  JESSICIA WESCHLER    MRN: [id number] DOB: Jan 29, 1971  10/16/2019  Ms. Capp was observed post Covid-19 immunization for 15 minutes without incident. She was provided with Vaccine Information Sheet and instruction to access the V-Safe system.   Ms. Losch was instructed to call 911 with any severe reactions post vaccine: Marland Kitchen Difficulty breathing  . Swelling of face and throat  . A fast heartbeat  . A bad rash all over body  . Dizziness and weakness   Immunizations Administered    Name Date Dose VIS Date Route   Pfizer COVID-19 Vaccine 10/16/2019  3:25 PM 0.3 mL 07/03/2019 Intramuscular   Manufacturer: [place]   Lot: [id number]   [place]: [id number]

## 2019-11-10 ENCOUNTER — Ambulatory Visit: Payer: Managed Care, Other (non HMO) | Attending: Internal Medicine

## 2019-11-10 DIAGNOSIS — Z23 Encounter for immunization: Secondary | ICD-10-CM

## 2019-11-10 NOTE — Progress Notes (Signed)
   Covid-19 Vaccination Clinic  Name:  Tanya Wilcox    MRN: [id number] DOB: 1970/10/04  11/10/2019  Tanya Wilcox was observed post Covid-19 immunization for 15 minutes without incident. She was provided with Vaccine Information Sheet and instruction to access the V-Safe system.   Tanya Wilcox was instructed to call 911 with any severe reactions post vaccine: Marland Kitchen Difficulty breathing  . Swelling of face and throat  . A fast heartbeat  . A bad rash all over body  . Dizziness and weakness   Immunizations Administered    Name Date Dose VIS Date Route   Pfizer COVID-19 Vaccine 11/10/2019  9:48 AM 0.3 mL 09/16/2018 Intramuscular   Manufacturer: [place]   Lot: [id number]   [place]: [id number]

## 2019-11-24 ENCOUNTER — Encounter: Payer: Managed Care, Other (non HMO) | Attending: General Surgery | Admitting: Skilled Nursing Facility1

## 2019-11-24 ENCOUNTER — Other Ambulatory Visit: Payer: Self-pay

## 2019-11-24 DIAGNOSIS — E669 Obesity, unspecified: Secondary | ICD-10-CM | POA: Insufficient documentation

## 2019-11-24 NOTE — Progress Notes (Signed)
Bariatric Nutrition Follow-Up Visit Medical Nutrition Therapy   NUTRITION ASSESSMENT  RYGB   Surgery: 06/29/2019  Anthropometrics  Start weight at [facility]: 245.2 lbs (date: 03/29/2019) Today's weight: 200.1 lbs  Body Composition Scale Date 11/24/2019  Weight  lbs 219.6 200.1  Total Body Fat  % 40.2 37.3     Visceral Fat 12 10  Fat-Free Mass  % 59.7 62.6     Total Body Water  % 44.3 45.8     Muscle-Mass  lbs 33.5 33.3  BMI 34.1 31.1  Body Fat Displacement ---         Torso  lbs 54.6 46.3        Left Leg  lbs 10.9 9.2        Right Leg  lbs 10.9 9.2        Left Arm  lbs 5.4 4.6        Right Arm  lbs 5.4 4.6   Clinical  Medical hx: HTN, GERD, DM type 2, bipolar Medications: zoloft, abilify  Labs:    Lifestyle & Dietary Hx  Pt states she has had wine and pizza. Pt states she started smoking again stating she believes it to be work stress. Pt states she is recognizing she is going back to her old habits with alcohol, food, and smoking. Pt states she does work with a talk therapist. 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 Pt states she feels her weight has not lost as fast as she would like: Dietitian advised pt to focus more on her unhealthy behaviors and not her weight.    Pt has started smoking again: Dietitian educated pt on the ramifications of this   Estimated daily fluid intake: 40.9 oz Estimated daily protein intake: 60+ g Supplements: inappropriate multi and no calcium  Current average weekly physical activity: 60 minutes walking videos   24-Hr Dietary Recall First Meal: decaf coffee protein shake Snack: pork skins or almonds  Second Meal 1:30: 3 chicken wings in ranch or regular BBQ Snack: sugar free popcicle  Third Meal: 1-2 chicken wings  Snack:  Beverages: decaf coffee 12 ounces, 3 16.9 ounces water, 12 ounce zero juice, beer, wine    Post-Op Goals/ Signs/ Symptoms Using straws: no Drinking while eating:  no Chewing/swallowing difficulties: no Changes in vision: no Changes to mood/headaches: no Hair loss/changes to skin/nails: no Difficulty focusing/concentrating: no Sweating: no Dizziness/lightheadedness: no Palpitations: no  Carbonated/caffeinated beverages: no N/V/D/C/Gas: every other day bowel movement   Abdominal pain: no Dumping syndrome: no    NUTRITION DIAGNOSIS  Overweight/obesity ([place]-3.3) related to past poor dietary habits and physical inactivity as evidenced by completed bariatric surgery and following dietary guidelines for continued weight loss and healthy nutrition status.     NUTRITION INTERVENTION Nutrition counseling (C-1) and education (E-2) to facilitate bariatric surgery goals, including: . The importance of consuming adequate calories as well as certain nutrients daily due to the body's need for essential vitamins, minerals, and fats . The importance of daily physical activity and to reach a goal of at least 150 minutes of moderate to vigorous physical activity weekly (or as directed by their physician) due to benefits such as increased musculature and improved lab values . The importance of intuitive eating specifically learning hunger-satiety cues and understanding the importance of learning a new body  Goals: -Work on taking 3 calcium each day -Get 60 grams of protein per day -get 64 fluid ounces per day -Work  with your therapist on stress relief mechanisms and smoking cessation and drinking alcohol when stressed    Handouts Provided Include     Learning Style & Readiness for Change Teaching method utilized: Visual & Auditory  Demonstrated degree of understanding via: Teach Back  Barriers to learning/adherence to lifestyle change: inappropriate stress relief mechanisms   RD's Notes for Next Visit . Assess adherence to pt chosen goals    MONITORING & EVALUATION Dietary intake, weekly physical activity, body weight  Next Steps Patient is to  follow-up in June

## 2019-12-08 ENCOUNTER — Other Ambulatory Visit: Payer: Self-pay

## 2019-12-09 ENCOUNTER — Other Ambulatory Visit (INDEPENDENT_AMBULATORY_CARE_PROVIDER_SITE_OTHER): Payer: Managed Care, Other (non HMO)

## 2019-12-09 DIAGNOSIS — E669 Obesity, unspecified: Secondary | ICD-10-CM

## 2019-12-09 DIAGNOSIS — E1169 Type 2 diabetes mellitus with other specified complication: Secondary | ICD-10-CM | POA: Diagnosis not present

## 2019-12-09 LAB — LIPID PANEL
Cholesterol: 144 mg/dL (ref 0–200)
HDL: 40.7 mg/dL (ref >39.00)
LDL Cholesterol: 88 mg/dL (ref 0–99)
NonHDL: 103.42
Total CHOL/HDL Ratio: 4
Triglycerides: 77 mg/dL (ref 0.0–149.0)
VLDL: 15.4 mg/dL (ref 0.0–40.0)

## 2019-12-09 LAB — BASIC METABOLIC PANEL
BUN: 19 mg/dL (ref 6–23)
CO2: 26 mEq/L (ref 19–32)
Calcium: 9.8 mg/dL (ref 8.4–10.5)
Chloride: 106 mEq/L (ref 96–112)
Creatinine, Ser: 0.94 mg/dL (ref 0.40–1.20)
GFR: 76.67 mL/min (ref >60.00)
Glucose, Bld: 131 mg/dL — ABNORMAL HIGH (ref 70–99)
Potassium: 4.3 mEq/L (ref 3.5–5.1)
Sodium: 139 mEq/L (ref 135–145)

## 2019-12-09 LAB — HEMOGLOBIN A1C: Hgb A1c MFr Bld: 5.9 % (ref 4.6–6.5)

## 2019-12-11 ENCOUNTER — Ambulatory Visit: Payer: Managed Care, Other (non HMO) | Admitting: Endocrinology

## 2019-12-11 ENCOUNTER — Other Ambulatory Visit: Payer: Self-pay

## 2019-12-11 ENCOUNTER — Encounter: Payer: Self-pay | Admitting: Endocrinology

## 2019-12-11 VITALS — BP 122/84 | HR 75 | Temp 98.2°F | Ht 67.0 in | Wt 192.2 lb

## 2019-12-11 DIAGNOSIS — E1169 Type 2 diabetes mellitus with other specified complication: Secondary | ICD-10-CM | POA: Diagnosis not present

## 2019-12-11 DIAGNOSIS — E669 Obesity, unspecified: Secondary | ICD-10-CM

## 2019-12-11 LAB — MICROALBUMIN / CREATININE URINE RATIO
Creatinine,U: 52.2 mg/dL
Microalb Creat Ratio: 1.3 mg/g (ref 0.0–30.0)
Microalb, Ur: <0.7 mg/dL (ref 0.0–1.9)

## 2019-12-11 NOTE — Progress Notes (Signed)
Patient ID: Tanya Wilcox, female   DOB: 1970/08/07, 49 y.o.   MRN: [id number]          Reason for Appointment: Follow-up for Type 2 Diabetes   Referring PCP: Debbrah Alar   History of Present Illness:          Date of diagnosis of type 2 diabetes mellitus: 2007       Background history:   She does not remember the initial symptoms at diagnosis and level of blood sugar and no previous records available Her A1c was 7% in 2014 and as high as 9.9 in 2015 She has previously taken metformin, Amaryl, Invokana, Actos and Januvia with variable level of control Lowest A1c was 6.4 in 2017 She was started in  7/18 on basal insulin using Basaglar presumably for worsening blood sugar control  Prior to bypass surgery was on Invokana, Toujeo and Ozempic and Humalog  Recent history:   She had a gastric bypass surgery done on 06/29/19  Non-insulin hypoglycemic drugs the patient is taking are: None  Current management, blood sugar patterns and problems identified:  Her A1c is now in the prediabetic range at 5.9 compared to 6.9   She has lost another 18 pounds since her last visit in February, previously had lost 30 pounds after surgery  She is checking blood sugars at different times but only about every other day  She still has relatively higher fasting readings up to about 129, lab fasting glucose was 131 although she had some coffee  Has only a couple of readings later in the day which are not high  She has had some exercise but now is starting to do walking on the treadmill at the gym  Her portion sizes are still small and does not always eat a full meal  May sometimes have a snack during the night also        Side effects from medications have been: Nausea from 2000 mg of metformin  Compliance with the medical regimen: Variable Hypoglycemia:   None  Glucose monitoring:  done about 1 times a day         Glucometer: One Touch ultra 2.       Blood Glucose readings from  monitor download as follows   PRE-MEAL Fasting Lunch Dinner Bedtime Overall  Glucose range:  109-129  120, 130   109   Mean/median:      119   POST-MEAL PC Breakfast PC Lunch PC Dinner  Glucose range:    111, 125  Mean/median:      Previous 14-day average = 154 and 30-day average = 118,   Weight history:  Wt Readings from Last 3 Encounters:  12/11/19 192 lb 3.2 oz (87.2 kg)  11/24/19 200 lb 1.6 oz (90.8 kg)  09/18/19 210 lb (95.3 kg)    Glycemic control:   Lab Results  Component Value Date   HGBA1C 5.9 12/09/2019   HGBA1C 6.9 (H) 09/08/2019   HGBA1C 6.7 (H) 06/25/2019   Lab Results  Component Value Date   MICROALBUR <0.7 12/03/2018   LDLCALC 88 12/09/2019   CREATININE 0.94 12/09/2019   Lab Results  Component Value Date   MICRALBCREAT 1.2 12/03/2018    Lab Results  Component Value Date   FRUCTOSAMINE 240 09/08/2019   FRUCTOSAMINE 277 12/03/2018      Allergies as of 12/11/2019   No Known Allergies     Medication List       Accurate as of Dec 11, 2019  9:01 AM. If you have any questions, ask your nurse or doctor.        STOP taking these medications   atorvastatin 40 MG tablet Commonly known as: LIPITOR Stopped by: Elayne Snare, MD   VITAMIN D (CHOLECALCIFEROL) PO Stopped by: Elayne Snare, MD     TAKE these medications   ALPRAZolam 0.5 MG tablet Commonly known as: XANAX Take 0.5 mg by mouth 2 (two) times daily as needed for anxiety.   ARIPiprazole 5 MG tablet Commonly known as: ABILIFY Take 5 mg by mouth daily.   BARIATRIC MULTIVITAMINS/IRON PO Take 2 tablets by mouth 2 (two) times daily.   CALCI-CHEW PO Take 1 tablet by mouth 3 (three) times daily. Chew one calci-chew three times daily.   levonorgestrel 20 MCG/24HR IUD Commonly known as: MIRENA 1 each by Intrauterine route once.   lisinopril 2.5 MG tablet Commonly known as: ZESTRIL Take 1 tablet (2.5 mg total) by mouth daily.   OneTouch Verio w/Device Kit USE TO TEST BLOOD SUGAR  THREE TIMES DAILY   pantoprazole 20 MG tablet Commonly known as: PROTONIX Take 20 mg by mouth daily.   sertraline 25 MG tablet Commonly known as: ZOLOFT Take 25 mg by mouth daily.   valACYclovir 500 MG tablet Commonly known as: VALTREX Take 1 tablet (500 mg total) by mouth daily.       Allergies:  No Known Allergies  Past Medical History:  Diagnosis Date  . Alcohol use   . Anxiety   . Bipolar disorder ([place])   . Depression   . Diabetes ([place])    TYPE 2   . Gestational diabetes    borderline II  . Osteoarthritis    knee, RIGHT   . Tobacco abuse     Past Surgical History:  Procedure Laterality Date  . CHOLECYSTECTOMY  2003  . GASTRIC ROUX-EN-Y N/A 06/29/2019   Procedure: LAPAROSCOPIC ROUX-EN-Y GASTRIC BYPASS WITH UPPER ENDOSCOPY, ERAS PATHWAY;  Surgeon: Alphonsa Overall, MD;  Location: [facility];  Service: General;  Laterality: N/A;    Family History  Problem Relation Age of Onset  . Hypertension Mother   . Hyperlipidemia Mother   . Alcohol abuse Father   . Diabetes Neg Hx     Social History:  reports that she quit smoking about 5 months ago. Her smoking use included cigarettes. She has never used smokeless tobacco. She reports current alcohol use. She reports that she does not use drugs.   Review of Systems   Lipid history: not On treatment, previously on Lipitor Does not have a strong family history of heart disease    Lab Results  Component Value Date   CHOL 144 12/09/2019   HDL 40.70 12/09/2019   LDLCALC 88 12/09/2019   TRIG 77.0 12/09/2019   CHOLHDL 4 12/09/2019           Hypertension: was Treated with lisinopril 2.5 mg by PCP, previously had been on 20 mg  BP Readings from Last 3 Encounters:  12/11/19 122/84  09/18/19 (!) 116/48  07/06/19 130/80    Most recent eye exam was in 12/2018, no retinopathy  Most recent foot exam: 2/21   LABS:  Lab on 12/09/2019  Component Date Value Ref Range Status  . Cholesterol 12/09/2019 144  0 - 200 mg/dL  Final   ATP III Classification       Desirable:  < 200 mg/dL               Borderline High:  200 - 239  mg/dL          High:  > = 240 mg/dL  . Triglycerides 12/09/2019 77.0  0.0 - 149.0 mg/dL Final   Normal:  <150 mg/dLBorderline High:  150 - 199 mg/dL  . HDL 12/09/2019 40.70  >39.00 mg/dL Final  . VLDL 12/09/2019 15.4  0.0 - 40.0 mg/dL Final  . LDL Cholesterol 12/09/2019 88  0 - 99 mg/dL Final  . Total CHOL/HDL Ratio 12/09/2019 4   Final                  Men          Women1/2 Average Risk     3.4          3.3Average Risk          5.0          4.42X Average Risk          9.6          7.13X Average Risk          15.0          11.0                      . NonHDL 12/09/2019 103.42   Final   NOTE:  Non-HDL goal should be 30 mg/dL higher than patient's LDL goal (i.e. LDL goal of < 70 mg/dL, would have non-HDL goal of < 100 mg/dL)  . Sodium 12/09/2019 139  135 - 145 mEq/L Final  . Potassium 12/09/2019 4.3  3.5 - 5.1 mEq/L Final  . Chloride 12/09/2019 106  96 - 112 mEq/L Final  . CO2 12/09/2019 26  19 - 32 mEq/L Final  . Glucose, Bld 12/09/2019 131* 70 - 99 mg/dL Final  . BUN 12/09/2019 19  6 - 23 mg/dL Final  . Creatinine, Ser 12/09/2019 0.94  0.40 - 1.20 mg/dL Final  . GFR 12/09/2019 76.67  >60.00 mL/min Final  . Calcium 12/09/2019 9.8  8.4 - 10.5 mg/dL Final  . Hgb A1c MFr Bld 12/09/2019 5.9  4.6 - 6.5 % Final   Glycemic Control Guidelines for People with Diabetes:Non Diabetic:  <6%Goal of Therapy: <7%Additional Action Suggested:  >8%     Physical Examination:  BP 122/84 (BP Location: Left Arm, Patient Position: Sitting, Cuff Size: Normal)   Pulse 75   Temp 98.2 F (36.8 C) (Temporal)   Ht _0  (1.702 m)   Wt 192 lb 3.2 oz (87.2 kg)   SpO2 99%   BMI 30.10 kg/m          ASSESSMENT:  Diabetes type 2, previously insulin-dependent  See history of present illness for detailed discussion of current diabetes management, blood sugar patterns and problems identified   Her A1c is much  improved at 5.9  Although her blood sugars are not back in the nondiabetic range with fasting readings as high as 131 her A1c indicates excellent control with continued weight loss She is quite motivated to continue watching her diet and exercise to continue her weight loss, has lost about 50 pounds since her gastric bypass surgery She likely will continue to improve especially with increasing her exercise now  History of HYPERTENSION: This has been followed by her PCP Likely needs at least 5 mg of lisinopril for optimal blood pressure control  History of hyperlipidemia: LDL is in the normal range without atorvastatin and since she did not have any other high risk factors may be able to continue watching  without treatment   PLAN:    Follow-up for diabetes as needed.  Follow-up with PCP for other issues and can come back here as needed  There are no Patient Instructions on file for this visit.      Elayne Snare 12/11/2019, 9:01 AM   Note: This office note was prepared with Dragon voice recognition system technology. Any transcriptional errors that result from this process are unintentional.

## 2019-12-11 NOTE — Addendum Note (Signed)
Addended by: Kaylyn Lim I on: 12/11/2019 09:19 AM   Modules accepted: Orders

## 2019-12-18 ENCOUNTER — Encounter: Payer: Self-pay | Admitting: Family

## 2019-12-18 ENCOUNTER — Ambulatory Visit: Payer: Managed Care, Other (non HMO) | Admitting: Family

## 2019-12-18 ENCOUNTER — Other Ambulatory Visit: Payer: Self-pay

## 2019-12-18 VITALS — BP 122/62 | HR 69 | Resp 17 | Ht 67.0 in | Wt 193.0 lb

## 2019-12-18 DIAGNOSIS — I10 Essential (primary) hypertension: Secondary | ICD-10-CM | POA: Diagnosis not present

## 2019-12-18 DIAGNOSIS — M25512 Pain in left shoulder: Secondary | ICD-10-CM | POA: Diagnosis not present

## 2019-12-18 DIAGNOSIS — E1165 Type 2 diabetes mellitus with hyperglycemia: Secondary | ICD-10-CM | POA: Diagnosis not present

## 2019-12-18 DIAGNOSIS — E785 Hyperlipidemia, unspecified: Secondary | ICD-10-CM | POA: Diagnosis not present

## 2019-12-18 NOTE — Progress Notes (Signed)
Subjective:    Patient ID: Tanya Wilcox, female    DOB: 11/09/1970, 49 y.o.   MRN: [id number]  HPI  Patient is a 49 yr old female who presents today for follow up.  HTN- maintained on lisinopril.   BP Readings from Last 3 Encounters:  12/18/19 122/62  12/11/19 122/84  09/18/19 (!) 116/48   Hyperlipidemia-  Lab Results  Component Value Date   CHOL 144 12/09/2019   HDL 40.70 12/09/2019   LDLCALC 88 12/09/2019   TRIG 77.0 12/09/2019   CHOLHDL 4 12/09/2019   Obesity- s/p gastric bypass 12/20. Max weight 244 prior to surgery. Reports that she was taking protonix for GI prophylaxis following her gastric bypass but plan is for her to stop.   Wt Readings from Last 3 Encounters:  12/18/19 193 lb (87.5 kg)  12/11/19 192 lb 3.2 oz (87.2 kg)  11/24/19 200 lb 1.6 oz (90.8 kg)   Left shoulder pain-  Reports aching in the back of her shoulder.  "like an ache, constantly.  Can't get comfortable. Pain started about 2 weeks ago.    Lab Results  Component Value Date   HGBA1C 5.9 12/09/2019     Review of Systems   See HPI  Past Medical History:  Diagnosis Date  . Alcohol use   . Anxiety   . Bipolar disorder ([place])   . Depression   . Diabetes ([place])    TYPE 2   . Gestational diabetes    borderline II  . Osteoarthritis    knee, RIGHT   . Tobacco abuse      Social History   Socioeconomic History  . Marital status: Single    Spouse name: Not on file  . Number of children: Not on file  . Years of education: Not on file  . Highest education level: Not on file  Occupational History  . Occupation: Sport and exercise psychologist: [employer]  Tobacco Use  . Smoking status: Former Smoker    Types: Cigarettes    Quit date: 06/21/2019    Years since quitting: 0.4  . Smokeless tobacco: Never Used  . Tobacco comment: 1 pack per day  Substance and Sexual Activity  . Alcohol use: Yes    Alcohol/week: 0.0 standard drinks    Comment: occasionally  . Drug use: No  . Sexual activity: Not  on file  Other Topics Concern  . Not on file  Social History Narrative   Lives with daughter   Works at [employer] and [employer]   Enjoys sleeping   1 dog   Completed high school, some college   Social Determinants of Health   Financial Resource Strain:   . Difficulty of Paying Living Expenses:   Food Insecurity:   . Worried About Charity fundraiser in the Last Year:   . Arboriculturist in the Last Year:   Transportation Needs:   . Film/video editor (Medical):   Marland Kitchen Lack of Transportation (Non-Medical):   Physical Activity:   . Days of Exercise per Week:   . Minutes of Exercise per Session:   Stress:   . Feeling of Stress :   Social Connections:   . Frequency of Communication with Friends and Family:   . Frequency of Social Gatherings with Friends and Family:   . Attends Religious Services:   . Active Member of Clubs or Organizations:   . Attends Archivist Meetings:   Marland Kitchen Marital Status:   Intimate  Partner Violence:   . Fear of Current or Ex-Partner:   . Emotionally Abused:   Marland Kitchen Physically Abused:   . Sexually Abused:     Past Surgical History:  Procedure Laterality Date  . CHOLECYSTECTOMY  2003  . GASTRIC ROUX-EN-Y N/A 06/29/2019   Procedure: LAPAROSCOPIC ROUX-EN-Y GASTRIC BYPASS WITH UPPER ENDOSCOPY, ERAS PATHWAY;  Surgeon: Alphonsa Overall, MD;  Location: [facility];  Service: General;  Laterality: N/A;    Family History  Problem Relation Age of Onset  . Hypertension Mother   . Hyperlipidemia Mother   . Alcohol abuse Father   . Diabetes Neg Hx     No Known Allergies  Current Outpatient Medications on File Prior to Visit  Medication Sig Dispense Refill  . ALPRAZolam (XANAX) 0.5 MG tablet Take 0.5 mg by mouth 2 (two) times daily as needed for anxiety.   1  . ARIPiprazole (ABILIFY) 5 MG tablet Take 5 mg by mouth daily.   1  . Blood Glucose Monitoring Suppl (ONETOUCH VERIO) w/Device KIT USE TO TEST BLOOD SUGAR THREE TIMES DAILY 1 kit 0  . Calcium  Carbonate (CALCI-CHEW PO) Take 1 tablet by mouth 3 (three) times daily. Chew one calci-chew three times daily.    Marland Kitchen levonorgestrel (MIRENA) 20 MCG/24HR IUD 1 each by Intrauterine route once.    Marland Kitchen lisinopril (ZESTRIL) 2.5 MG tablet Take 1 tablet (2.5 mg total) by mouth daily. 90 tablet 1  . Multiple Vitamins-Minerals (BARIATRIC MULTIVITAMINS/IRON PO) Take 2 tablets by mouth 2 (two) times daily.    . pantoprazole (PROTONIX) 20 MG tablet Take 20 mg by mouth daily.    . sertraline (ZOLOFT) 25 MG tablet Take 25 mg by mouth daily.  1  . valACYclovir (VALTREX) 500 MG tablet Take 1 tablet (500 mg total) by mouth daily. 90 tablet 1   No current facility-administered medications on file prior to visit.    BP 122/62 (BP Location: Left Arm, Patient Position: Sitting, Cuff Size: Normal)   Pulse 69   Resp 17   Ht '5\' 7"'  (1.702 m)   Wt 193 lb (87.5 kg)   SpO2 99%   BMI 30.23 kg/m       Objective:   Physical Exam Constitutional:      Appearance: She is well-developed.  Neck:     Thyroid: No thyromegaly.  Cardiovascular:     Rate and Rhythm: Normal rate and regular rhythm.     Heart sounds: Normal heart sounds. No murmur.  Pulmonary:     Effort: Pulmonary effort is normal. No respiratory distress.     Breath sounds: Normal breath sounds. No wheezing.  Musculoskeletal:     Left shoulder: Normal. No swelling, deformity, effusion or tenderness. Normal range of motion.     Cervical back: Neck supple.  Skin:    General: Skin is warm and dry.  Neurological:     Mental Status: She is alert and oriented to person, place, and time.  Psychiatric:        Behavior: Behavior normal.        Thought Content: Thought content normal.        Judgment: Judgment normal.           Assessment & Plan:  Left shoulder pain- no improvement with tylenol- can't take NSAIDS due to bariatric status. Will refer to sports medicine.  DM2- significant improvement in A1C following her recent weight loss.  Endo has  signed off. Monitor. Lab Results  Component Value Date   HGBA1C 5.9  12/09/2019   HGBA1C 6.9 (H) 09/08/2019   HGBA1C 6.7 (H) 06/25/2019   Lab Results  Component Value Date   MICROALBUR <0.7 12/11/2019   LDLCALC 88 12/09/2019   CREATININE 0.94 12/09/2019   HTN- bp stable on low dose ACE. Continue same. Monitor.   Hyperlipidemia- lipids stable. Not on statin. Monitor.  This visit occurred during the SARS-CoV-2 public health emergency.  Safety protocols were in place, including screening questions prior to the visit, additional usage of staff PPE, and extensive cleaning of exam room while observing appropriate contact time as indicated for disinfecting solutions.

## 2019-12-22 ENCOUNTER — Ambulatory Visit: Payer: Managed Care, Other (non HMO) | Admitting: Family Medicine

## 2019-12-22 ENCOUNTER — Encounter: Payer: Self-pay | Admitting: Family Medicine

## 2019-12-22 ENCOUNTER — Other Ambulatory Visit: Payer: Self-pay

## 2019-12-22 ENCOUNTER — Ambulatory Visit: Payer: Self-pay

## 2019-12-22 VITALS — BP 137/85 | HR 61 | Ht 67.0 in | Wt 197.0 lb

## 2019-12-22 DIAGNOSIS — M7542 Impingement syndrome of left shoulder: Secondary | ICD-10-CM | POA: Diagnosis not present

## 2019-12-22 DIAGNOSIS — M25512 Pain in left shoulder: Secondary | ICD-10-CM

## 2019-12-22 NOTE — Progress Notes (Signed)
SKYLIER Wilcox - 49 y.o. female MRN [id number]  Date of birth: 10/24/1970  SUBJECTIVE:  Including CC & ROS.  Chief Complaint  Patient presents with  . Shoulder Pain    left x 2 weeks    Tanya Wilcox is a 49 y.o. female that is presenting with left shoulder pain.  His pain is acute in nature.  She states feels the pain over the anterior and posterior aspect of the shoulder.  She also feels it laterally.  She works as a Teaching laboratory technician most all day.   Review of Systems See HPI   HISTORY: Past Medical, Surgical, Social, and Family History Reviewed & Updated per EMR.   Pertinent Historical Findings include:  Past Medical History:  Diagnosis Date  . Alcohol use   . Anxiety   . Bipolar disorder ([place])   . Depression   . Diabetes ([place])    TYPE 2   . Gestational diabetes    borderline II  . Osteoarthritis    knee, RIGHT   . Tobacco abuse     Past Surgical History:  Procedure Laterality Date  . CHOLECYSTECTOMY  2003  . GASTRIC ROUX-EN-Y N/A 06/29/2019   Procedure: LAPAROSCOPIC ROUX-EN-Y GASTRIC BYPASS WITH UPPER ENDOSCOPY, ERAS PATHWAY;  Surgeon: Alphonsa Overall, MD;  Location: [facility];  Service: General;  Laterality: N/A;    Family History  Problem Relation Age of Onset  . Hypertension Mother   . Hyperlipidemia Mother   . Alcohol abuse Father   . Diabetes Neg Hx     Social History   Socioeconomic History  . Marital status: Single    Spouse name: Not on file  . Number of children: Not on file  . Years of education: Not on file  . Highest education level: Not on file  Occupational History  . Occupation: Sport and exercise psychologist: [employer]  Tobacco Use  . Smoking status: Former Smoker    Types: Cigarettes    Quit date: 06/21/2019    Years since quitting: 0.5  . Smokeless tobacco: Never Used  . Tobacco comment: 1 pack per day  Substance and Sexual Activity  . Alcohol use: Yes    Alcohol/week: 0.0 standard drinks    Comment: occasionally  . Drug use: No  . Sexual activity: Not  on file  Other Topics Concern  . Not on file  Social History Narrative   Lives with daughter   Works at [employer] and [employer]   Enjoys sleeping   1 dog   Completed high school, some college   Social Determinants of Health   Financial Resource Strain:   . Difficulty of Paying Living Expenses:   Food Insecurity:   . Worried About Charity fundraiser in the Last Year:   . Arboriculturist in the Last Year:   Transportation Needs:   . Film/video editor (Medical):   Marland Kitchen Lack of Transportation (Non-Medical):   Physical Activity:   . Days of Exercise per Week:   . Minutes of Exercise per Session:   Stress:   . Feeling of Stress :   Social Connections:   . Frequency of Communication with Friends and Family:   . Frequency of Social Gatherings with Friends and Family:   . Attends Religious Services:   . Active Member of Clubs or Organizations:   . Attends Archivist Meetings:   Marland Kitchen Marital Status:   Intimate Partner Violence:   . Fear of Current or Ex-Partner:   .  Emotionally Abused:   Marland Kitchen Physically Abused:   . Sexually Abused:      PHYSICAL EXAM:  VS: BP 137/85   Pulse 61   Ht 5\' 7"  (1.702 m)   Wt 197 lb (89.4 kg)   BMI 30.85 kg/m  Physical Exam Gen: NAD, alert, cooperative with exam, well-appearing MSK:  Left shoulder: Normal active flexion and abduction. Normal external rotation. Pain with external rotation and abduction. Pain with empty can test. Normal speeds test. Pain with O'Brien's test. Neurovascularly intact  Limited ultrasound: Left shoulder:  Normal-appearing biceps tendon and short and long axis. Subscapularis with minor calcifications at the insertion footprint. Supraspinatus is intact in the anterior leaflet.  Does have calcification in the posterior leaflet with no hyperemia.  Dynamic testing reveals an abnormal rocking motion with abduction Normal-appearing posterior glenohumeral joint.  Summary: Findings would suggest some form  of impingement  Ultrasound and interpretation by Clearance Coots, MD    ASSESSMENT & PLAN:   Shoulder impingement syndrome, left No structural changes observed on ultrasound.  Dynamic testing revealed some abnormal motion which could lead to an impingement type issue. -Counseled on home exercise therapy and supportive care. - Provided samples of Pennsaid.  Can send prescription in.  She does have a history of gastric bypass. -If no improvement can consider imaging or physical therapy or injection.

## 2019-12-22 NOTE — Patient Instructions (Signed)
Nice to meet you Please try the exercises  Please try heat before the exercises and ice after  Please try the rub on medicine. Please let me know if it works and I can send in a prescription   Please let us know if you want to try a standing desk.  Please send me a message in MyChart with any questions or updates.  Please see me back in 4 weeks.   --Dr. Raeford Razor

## 2019-12-22 NOTE — Progress Notes (Signed)
Medication Samples have been provided to the patient.  Drug name: Pennsaid       Strength: 2%        Qty: 2 Boxes  LOT: [id number]  Exp.Date: 07/2020  Dosing instructions: Use a pea size amount and rub gently.  The patient has been instructed regarding the correct time, dose, and frequency of taking this medication, including desired effects and most common side effects.   Sherrie George, [place] 12:23 PM 12/22/2019

## 2019-12-22 NOTE — Assessment & Plan Note (Signed)
No structural changes observed on ultrasound.  Dynamic testing revealed some abnormal motion which could lead to an impingement type issue. -Counseled on home exercise therapy and supportive care. - Provided samples of Pennsaid.  Can send prescription in.  She does have a history of gastric bypass. -If no improvement can consider imaging or physical therapy or injection.

## 2019-12-23 ENCOUNTER — Other Ambulatory Visit: Payer: Self-pay

## 2019-12-23 ENCOUNTER — Encounter: Payer: Managed Care, Other (non HMO) | Attending: General Surgery | Admitting: Skilled Nursing Facility1

## 2019-12-23 DIAGNOSIS — E669 Obesity, unspecified: Secondary | ICD-10-CM | POA: Insufficient documentation

## 2019-12-23 DIAGNOSIS — E1165 Type 2 diabetes mellitus with hyperglycemia: Secondary | ICD-10-CM

## 2019-12-23 NOTE — Progress Notes (Signed)
Bariatric Nutrition Follow-Up Visit Medical Nutrition Therapy   NUTRITION ASSESSMENT  [facility]   Surgery: 06/29/2019  Anthropometrics  Start weight at [facility]: 245.2 lbs (date: 03/29/2019) Today's weight: 196.8 lbs  Body Composition Scale Date 11/24/2019 12/23/2019  Weight  lbs 219.6 200.1 196.8  Total Body Fat  % 40.2 37.3 36.8     Visceral Fat 12 10 10   Fat-Free Mass  % 59.7 62.6 63.1     Total Body Water  % 44.3 45.8 46     Muscle-Mass  lbs 33.5 33.3 33.2  BMI 34.1 31.1 30.5  Body Fat Displacement ---          Torso  lbs 54.6 46.3 44.8        Left Leg  lbs 10.9 9.2 8.9        Right Leg  lbs 10.9 9.2 8.9        Left Arm  lbs 5.4 4.6 4.4        Right Arm  lbs 5.4 4.6 4.4   Clinical  Medical hx: HTN, GERD, DM type 2, bipolar Medications: zoloft, abilify  Labs:    Lifestyle & Dietary Hx  Pt states she was 188 pounds last week (this weight was clarified) Dietitian advised pt that is not possible unless she has a weight retention to which she states she does not. Pt state she was able to stop drinking alcohol (realixzin alcohol was causing weight gain). Pt states she continues to smoke and recognizes she needs to quit but is having trouble doing that. Pt admits to each meal being an emotional situation.  Pt states the fear of gaining weight back has kept her from doing right by her body. Pt states she does weigh every day.   Estimated daily fluid intake: 40.9 oz Estimated daily protein intake: 60+ g Supplements: bari fusion and no calcium  Current average weekly physical activity: walking 3 miles 2-3 days a week  24-Hr Dietary Recall First Meal 8-11am: 2 eggs + 2 bacon (taking until 11 to eat it all) Snack: pork skins or almonds  Second Meal 1:30: skipped or chicken Snack: sugar free popcicle  Third Meal: shrimp alfredo with pasta  Snack:  Beverages: decaf coffee 12 ounces, 3 16.9 ounces water, 12 ounce zero juice  Post-Op Goals/ Signs/ Symptoms Using straws: no Drinking  while eating: no Chewing/swallowing difficulties: no Changes in vision: no Changes to mood/headaches: no Hair loss/changes to skin/nails: no Difficulty focusing/concentrating: no Sweating: no Dizziness/lightheadedness: no Palpitations: no  Carbonated/caffeinated beverages: no N/V/D/C/Gas: every other day bowel movement   Abdominal pain: no Dumping syndrome: no    NUTRITION DIAGNOSIS  Overweight/obesity (-3.3) related to past poor dietary habits and physical inactivity as evidenced by completed bariatric surgery and following dietary guidelines for continued weight loss and healthy nutrition status.     NUTRITION INTERVENTION Nutrition counseling (C-1) and education (E-2) to facilitate bariatric surgery goals, including: . The importance of consuming adequate calories as well as certain nutrients daily due to the body's need for essential vitamins, minerals, and fats . The importance of daily physical activity and to reach a goal of at least 150 minutes of moderate to vigorous physical activity weekly (or as directed by their physician) due to benefits such as increased musculature and improved lab values . The importance of intuitive eating specifically learning hunger-satiety cues and understanding the importance of learning a new body  Goals: -Work on taking 3 calcium each day -Get 60 grams of protein per day -get  64 fluid ounces per day -Work with your therapist on stress relief mechanisms and smoking cessation and drinking alcohol when stressed   -Come up with a phrase that makes you feel good and instills confidence  -Work on not weighing every day with your therapist -Do not eat and drink at the same time  -Measure out your rice using 1/3 cup cooked (if this causes anxiety stop)  Handouts Provided Include   All maintainance phase handouts   Learning Style & Readiness for Change Teaching method utilized: Visual & Auditory  Demonstrated degree of understanding via:  Teach Back  Barriers to learning/adherence to lifestyle change: inappropriate stress relief mechanisms   RD's Notes for Next Visit . Assess adherence to pt chosen goals    MONITORING & EVALUATION Dietary intake, weekly physical activity, body weight  Next Steps Patient is to follow-up in July

## 2020-01-19 ENCOUNTER — Ambulatory Visit: Payer: Managed Care, Other (non HMO) | Admitting: Family Medicine

## 2020-01-21 ENCOUNTER — Ambulatory Visit: Payer: Managed Care, Other (non HMO) | Admitting: Skilled Nursing Facility1

## 2020-03-21 ENCOUNTER — Encounter: Payer: Managed Care, Other (non HMO) | Admitting: Family

## 2020-05-06 ENCOUNTER — Encounter: Payer: Self-pay | Admitting: Endocrinology

## 2020-05-06 NOTE — Progress Notes (Signed)
Received a fax requesting an updated PA for Ozempic. Upon looking at pt's med list no longer see medication listed. Reviewed Dr. Ronnie Derby note and he notes pt was previously on ozempic but nothing stating pt is to continue. Will disregard PA.

## 2020-06-07 ENCOUNTER — Encounter: Payer: Self-pay | Admitting: Family

## 2020-06-08 ENCOUNTER — Other Ambulatory Visit: Payer: Self-pay | Admitting: Family

## 2020-06-08 MED ORDER — VALACYCLOVIR HCL 500 MG PO TABS: 500.0000 mg | ORAL_TABLET | Freq: Every day | ORAL | 1 refills | Status: DC

## 2020-06-08 MED ORDER — FUROSEMIDE 20 MG PO TABS: 20.0000 mg | ORAL_TABLET | Freq: Every day | ORAL | 0 refills | Status: DC | PRN

## 2020-06-14 ENCOUNTER — Other Ambulatory Visit: Payer: Self-pay

## 2020-06-14 ENCOUNTER — Encounter: Payer: Self-pay | Admitting: Family

## 2020-06-14 ENCOUNTER — Ambulatory Visit: Payer: Managed Care, Other (non HMO) | Admitting: Family

## 2020-06-14 VITALS — BP 156/79 | HR 66 | Temp 98.5°F | Resp 16 | Ht 67.0 in | Wt 192.0 lb

## 2020-06-14 DIAGNOSIS — Z8639 Personal history of other endocrine, nutritional and metabolic disease: Secondary | ICD-10-CM | POA: Insufficient documentation

## 2020-06-14 DIAGNOSIS — I10 Essential (primary) hypertension: Secondary | ICD-10-CM

## 2020-06-14 DIAGNOSIS — E118 Type 2 diabetes mellitus with unspecified complications: Secondary | ICD-10-CM

## 2020-06-14 DIAGNOSIS — E1142 Type 2 diabetes mellitus with diabetic polyneuropathy: Secondary | ICD-10-CM

## 2020-06-14 DIAGNOSIS — R600 Localized edema: Secondary | ICD-10-CM

## 2020-06-14 MED ORDER — SERTRALINE HCL 25 MG PO TABS: 25.0000 mg | ORAL_TABLET | Freq: Every day | ORAL | 1 refills | Status: DC

## 2020-06-14 MED ORDER — LISINOPRIL 10 MG PO TABS: 10.0000 mg | ORAL_TABLET | Freq: Every day | ORAL | 1 refills | Status: DC

## 2020-06-14 MED ORDER — GABAPENTIN 100 MG PO CAPS: 100.0000 mg | ORAL_CAPSULE | Freq: Three times a day (TID) | ORAL | 1 refills | Status: DC

## 2020-06-14 NOTE — Patient Instructions (Signed)
Please start gabapentin 100mg  3x daily for nerve pain. Increase lisinopril to 10mg  once daily for blood pressure.

## 2020-06-14 NOTE — Progress Notes (Signed)
Subjective:    Patient ID: Tanya Wilcox, female    DOB: 08-19-1970, 49 y.o.   MRN: [id number]  HPI  Patient is a 49 yr old female who presents today with c/o foot swelling. Started about 2 weeks ago. Denies SOB.  She has been taking lasix every other day which has helped.  Reports she is working a second job at [employer] and she is on her feet more.   DM2- reports chronic burning in her feet which is bothersome.   Bipolar disorder- Reports mood is stable. She continues to follow with psychiatry.  DM2-  Lab Results  Component Value Date   HGBA1C 5.9 12/09/2019   HGBA1C 6.9 (H) 09/08/2019   HGBA1C 6.7 (H) 06/25/2019   Lab Results  Component Value Date   MICROALBUR <0.7 12/11/2019   LDLCALC 88 12/09/2019   CREATININE 0.94 12/09/2019     Flu shot today.   BP Readings from Last 3 Encounters:  06/14/20 (!) 156/79  12/22/19 137/85  12/18/19 122/62     Review of Systems See HPI  Past Medical History:  Diagnosis Date  . Alcohol use   . Anxiety   . Bipolar disorder ([place])   . Depression   . Diabetes ([place])    TYPE 2   . Gestational diabetes    borderline II  . Osteoarthritis    knee, RIGHT   . Tobacco abuse      Social History   Socioeconomic History  . Marital status: Single    Spouse name: Not on file  . Number of children: Not on file  . Years of education: Not on file  . Highest education level: Not on file  Occupational History  . Occupation: Sport and exercise psychologist: [employer]  Tobacco Use  . Smoking status: Former Smoker    Types: Cigarettes    Quit date: 06/21/2019    Years since quitting: 0.9  . Smokeless tobacco: Never Used  . Tobacco comment: 1 pack per day  Substance and Sexual Activity  . Alcohol use: Yes    Alcohol/week: 0.0 standard drinks    Comment: occasionally  . Drug use: No  . Sexual activity: Not on file  Other Topics Concern  . Not on file  Social History Narrative   Lives with daughter   Works at [employer]    Enjoys sleeping   1 dog   Completed high school, some college   Social Determinants of Health   Financial Resource Strain:   . Difficulty of Paying Living Expenses: Not on file  Food Insecurity:   . Worried About Charity fundraiser in the Last Year: Not on file  . Ran Out of Food in the Last Year: Not on file  Transportation Needs:   . Lack of Transportation (Medical): Not on file  . Lack of Transportation (Non-Medical): Not on file  Physical Activity:   . Days of Exercise per Week: Not on file  . Minutes of Exercise per Session: Not on file  Stress:   . Feeling of Stress : Not on file  Social Connections:   . Frequency of Communication with Friends and Family: Not on file  . Frequency of Social Gatherings with Friends and Family: Not on file  . Attends Religious Services: Not on file  . Active Member of Clubs or Organizations: Not on file  . Attends Archivist Meetings: Not on file  . Marital Status: Not on file  Intimate Partner  Violence:   . Fear of Current or Ex-Partner: Not on file  . Emotionally Abused: Not on file  . Physically Abused: Not on file  . Sexually Abused: Not on file    Past Surgical History:  Procedure Laterality Date  . CHOLECYSTECTOMY  2003  . GASTRIC ROUX-EN-Y N/A 06/29/2019   Procedure: LAPAROSCOPIC ROUX-EN-Y GASTRIC BYPASS WITH UPPER ENDOSCOPY, ERAS PATHWAY;  Surgeon: Alphonsa Overall, MD;  Location: [facility];  Service: General;  Laterality: N/A;    Family History  Problem Relation Age of Onset  . Hypertension Mother   . Hyperlipidemia Mother   . Alcohol abuse Father   . Diabetes Neg Hx     No Known Allergies  Current Outpatient Medications on File Prior to Visit  Medication Sig Dispense Refill  . ALPRAZolam (XANAX) 0.5 MG tablet Take 0.5 mg by mouth 2 (two) times daily as needed for anxiety.   1  . ARIPiprazole (ABILIFY) 5 MG tablet Take 5 mg by mouth daily.   1  . Blood Glucose Monitoring Suppl (ONETOUCH VERIO) w/Device KIT USE TO  TEST BLOOD SUGAR THREE TIMES DAILY 1 kit 0  . Calcium Carbonate (CALCI-CHEW PO) Take 1 tablet by mouth 3 (three) times daily. Chew one calci-chew three times daily.    . furosemide (LASIX) 20 MG tablet Take 1 tablet (20 mg total) by mouth daily as needed. 30 tablet 0  . levonorgestrel (MIRENA) 20 MCG/24HR IUD 1 each by Intrauterine route once.    . Multiple Vitamins-Minerals (BARIATRIC MULTIVITAMINS/IRON PO) Take 2 tablets by mouth 2 (two) times daily.    . valACYclovir (VALTREX) 500 MG tablet Take 1 tablet (500 mg total) by mouth daily. 90 tablet 1  . [DISCONTINUED] lisinopril (ZESTRIL) 2.5 MG tablet Take 1 tablet (2.5 mg total) by mouth daily. 90 tablet 1   No current facility-administered medications on file prior to visit.    BP (!) 156/79 (BP Location: Right Arm, Patient Position: Sitting, Cuff Size: Small)   Pulse 66   Temp 98.5 F (36.9 C) (Oral)   Resp 16   Ht _0  (1.702 m)   Wt 192 lb (87.1 kg)   SpO2 100%   BMI 30.07 kg/m       Objective:   Physical Exam Constitutional:      Appearance: She is well-developed.  Neck:     Thyroid: No thyromegaly.  Cardiovascular:     Rate and Rhythm: Normal rate and regular rhythm.     Heart sounds: Normal heart sounds. No murmur heard.   Pulmonary:     Effort: Pulmonary effort is normal. No respiratory distress.     Breath sounds: Normal breath sounds. No wheezing.  Musculoskeletal:     Cervical back: Neck supple.     Right lower leg: 2+ Edema present.     Left lower leg: 2+ Edema present.  Skin:    General: Skin is warm and dry.  Neurological:     Mental Status: She is alert and oriented to person, place, and time.  Psychiatric:        Behavior: Behavior normal.        Thought Content: Thought content normal.        Judgment: Judgment normal.           Assessment & Plan:  Bilateral LE edema- recommended that she continue prn lasix.  Also suggested that she wear compression stocking while working.  She did have a  CBC/CMET and A1C performed on 10/29. I reviewed these  results on her [facility] portal.  DM2- A1C 5.9 on 10/29.  Stable. Continue diabetic diet.   HTN- uncontrolled. Will increase lisinopril from 2.5 to 66m once daily. Plan to bring her back in 1 week for bmet.  DM peripheral neuropathy- will give a trial of gabapentin 1042mTID.   Flu shot today.   This visit occurred during the SARS-CoV-2 public health emergency.  Safety protocols were in place, including screening questions prior to the visit, additional usage of staff PPE, and extensive cleaning of exam room while observing appropriate contact time as indicated for disinfecting solutions.

## 2020-06-21 ENCOUNTER — Ambulatory Visit: Payer: Managed Care, Other (non HMO) | Admitting: Family

## 2020-08-04 ENCOUNTER — Other Ambulatory Visit: Payer: Self-pay

## 2020-08-05 ENCOUNTER — Other Ambulatory Visit: Payer: Self-pay

## 2020-08-05 ENCOUNTER — Ambulatory Visit: Payer: Managed Care, Other (non HMO) | Admitting: Family

## 2020-08-05 ENCOUNTER — Encounter: Payer: Self-pay | Admitting: Family

## 2020-08-05 VITALS — BP 139/66 | HR 66 | Temp 98.6°F | Resp 16 | Ht 67.0 in | Wt 190.0 lb

## 2020-08-05 DIAGNOSIS — R600 Localized edema: Secondary | ICD-10-CM

## 2020-08-05 DIAGNOSIS — F419 Anxiety disorder, unspecified: Secondary | ICD-10-CM

## 2020-08-05 DIAGNOSIS — Z23 Encounter for immunization: Secondary | ICD-10-CM | POA: Diagnosis not present

## 2020-08-05 DIAGNOSIS — F32A Depression, unspecified: Secondary | ICD-10-CM

## 2020-08-05 DIAGNOSIS — E1142 Type 2 diabetes mellitus with diabetic polyneuropathy: Secondary | ICD-10-CM

## 2020-08-05 DIAGNOSIS — Z1159 Encounter for screening for other viral diseases: Secondary | ICD-10-CM | POA: Diagnosis not present

## 2020-08-05 DIAGNOSIS — I10 Essential (primary) hypertension: Secondary | ICD-10-CM

## 2020-08-05 DIAGNOSIS — Z1211 Encounter for screening for malignant neoplasm of colon: Secondary | ICD-10-CM | POA: Diagnosis not present

## 2020-08-05 DIAGNOSIS — E118 Type 2 diabetes mellitus with unspecified complications: Secondary | ICD-10-CM | POA: Diagnosis not present

## 2020-08-05 MED ORDER — LISINOPRIL 20 MG PO TABS: 20.0000 mg | ORAL_TABLET | Freq: Every day | ORAL | 3 refills | Status: DC

## 2020-08-05 NOTE — Progress Notes (Signed)
Subjective:    Patient ID: Tanya Wilcox, female    DOB: 17-Dec-1970, 50 y.o.   MRN: [id number]  HPI  Patient is a 50 yr old female who presents today for follow up  HTN-last visit we increased her lisinopril from 2.5 to 10 mg once daily.  She got confused and restarted her 20 mg tabs that she had on hand.  She continues the 20 mg dosing. BP Readings from Last 3 Encounters:  08/05/20 139/66  06/14/20 (!) 156/79  12/22/19 137/85   Bilateral LE edema- last visit we discussed trial of compression stockings while working.  She reports that she did try this and it was quite helpful. DM peripheral neuropathy- last visit we gave a trial of gabapentin. She reports that she is taking on an as needed basis.    Reports mood is good.  Continues zoloft.  Anxiety/depression-she reports mood is stable on Zoloft 25 mg daily.  Diabetes type 2-this is managed with diet.  Review of Systems See HPI  Past Medical History:  Diagnosis Date  . Alcohol use   . Anxiety   . Bipolar disorder ([place])   . Depression   . Diabetes ([place])    TYPE 2   . Gestational diabetes    borderline II  . Osteoarthritis    knee, RIGHT   . Tobacco abuse      Social History   Socioeconomic History  . Marital status: Single    Spouse name: Not on file  . Number of children: Not on file  . Years of education: Not on file  . Highest education level: Not on file  Occupational History  . Occupation: Sport and exercise psychologist: [employer]  Tobacco Use  . Smoking status: Former Smoker    Types: Cigarettes    Quit date: 06/21/2019    Years since quitting: 1.1  . Smokeless tobacco: Never Used  . Tobacco comment: 1 pack per day  Substance and Sexual Activity  . Alcohol use: Yes    Alcohol/week: 0.0 standard drinks    Comment: occasionally  . Drug use: No  . Sexual activity: Not on file  Other Topics Concern  . Not on file  Social History Narrative   Lives with daughter   Works at [employer] and [employer]   Enjoys  sleeping   1 dog   Completed high school, some college   Social Determinants of Health   Financial Resource Strain: Not on file  Food Insecurity: Not on file  Transportation Needs: Not on file  Physical Activity: Not on file  Stress: Not on file  Social Connections: Not on file  Intimate Partner Violence: Not on file    Past Surgical History:  Procedure Laterality Date  . CHOLECYSTECTOMY  2003  . GASTRIC ROUX-EN-Y N/A 06/29/2019   Procedure: LAPAROSCOPIC ROUX-EN-Y GASTRIC BYPASS WITH UPPER ENDOSCOPY, ERAS PATHWAY;  Surgeon: Alphonsa Overall, MD;  Location: [facility];  Service: General;  Laterality: N/A;    Family History  Problem Relation Age of Onset  . Hypertension Mother   . Hyperlipidemia Mother   . Alcohol abuse Father   . Diabetes Neg Hx     No Known Allergies  Current Outpatient Medications on File Prior to Visit  Medication Sig Dispense Refill  . ALPRAZolam (XANAX) 0.5 MG tablet Take 0.5 mg by mouth 2 (two) times daily as needed for anxiety.   1  . ARIPiprazole (ABILIFY) 5 MG tablet Take 5 mg by mouth daily.  1  . Blood Glucose Monitoring Suppl (ONETOUCH VERIO) w/Device KIT USE TO TEST BLOOD SUGAR THREE TIMES DAILY 1 kit 0  . Calcium Carbonate (CALCI-CHEW PO) Take 1 tablet by mouth 3 (three) times daily. Chew one calci-chew three times daily.    . furosemide (LASIX) 20 MG tablet Take 1 tablet (20 mg total) by mouth daily as needed. 30 tablet 0  . gabapentin (NEURONTIN) 100 MG capsule Take 1 capsule (100 mg total) by mouth 3 (three) times daily. 270 capsule 1  . levonorgestrel (MIRENA) 20 MCG/24HR IUD 1 each by Intrauterine route once.    Marland Kitchen lisinopril (ZESTRIL) 10 MG tablet Take 1 tablet (10 mg total) by mouth daily. 90 tablet 1  . Multiple Vitamins-Minerals (BARIATRIC MULTIVITAMINS/IRON PO) Take 2 tablets by mouth 2 (two) times daily.    . sertraline (ZOLOFT) 25 MG tablet Take 1 tablet (25 mg total) by mouth daily.  1  . valACYclovir (VALTREX) 500 MG tablet Take 1 tablet  (500 mg total) by mouth daily. 90 tablet 1   No current facility-administered medications on file prior to visit.    BP 139/66 (BP Location: Right Arm, Patient Position: Sitting, Cuff Size: Small)   Pulse 66   Temp 98.6 F (37 C) (Oral)   Resp 16   Ht _0  (1.702 m)   Wt 190 lb (86.2 kg)   BMI 29.76 kg/m       Objective:   Physical Exam Constitutional:      Appearance: She is well-developed and well-nourished.  Neck:     Thyroid: No thyromegaly.  Cardiovascular:     Rate and Rhythm: Normal rate and regular rhythm.     Heart sounds: Normal heart sounds. No murmur heard.   Pulmonary:     Effort: Pulmonary effort is normal. No respiratory distress.     Breath sounds: Normal breath sounds. No wheezing.  Musculoskeletal:     Cervical back: Neck supple.     Right lower leg: No edema.     Left lower leg: No edema.  Skin:    General: Skin is warm and dry.  Neurological:     Mental Status: She is alert and oriented to person, place, and time.  Psychiatric:        Mood and Affect: Mood and affect normal.        Behavior: Behavior normal.        Thought Content: Thought content normal.        Judgment: Judgment normal.           Assessment & Plan:  Hypertension-currently stable.  I advised the patient to continue the 20 mg tablets.  Will obtain follow-up metabolic panel.  Anxiety/depression-stable, continue Zoloft 25 mg daily.  Diabetes type 2- clinically stable on diabetic diet.  Continue same. Lab Results  Component Value Date   HGBA1C 5.9 12/09/2019   HGBA1C 6.9 (H) 09/08/2019   HGBA1C 6.7 (H) 06/25/2019   Lab Results  Component Value Date   MICROALBUR <0.7 12/11/2019   LDLCALC 88 12/09/2019   CREATININE 0.94 12/09/2019   Lower extremity edema- stable with as needed Lasix and use of compression stockings.  Flu shot and Tdap today   This visit occurred during the SARS-CoV-2 public health emergency.  Safety protocols were in place, including screening  questions prior to the visit, additional usage of staff PPE, and extensive cleaning of exam room while observing appropriate contact time as indicated for disinfecting solutions.

## 2020-08-06 LAB — BASIC METABOLIC PANEL
BUN/Creatinine Ratio: 19 (ref 9–23)
BUN: 16 mg/dL (ref 6–24)
CO2: 24 mmol/L (ref 20–29)
Calcium: 9.5 mg/dL (ref 8.7–10.2)
Chloride: 103 mmol/L (ref 96–106)
Creatinine, Ser: 0.83 mg/dL (ref 0.57–1.00)
GFR calc Af Amer: 96 mL/min/{1.73_m2} (ref >59)
GFR calc non Af Amer: 83 mL/min/{1.73_m2} (ref >59)
Glucose: 131 mg/dL — ABNORMAL HIGH (ref 65–99)
Potassium: 5.1 mmol/L (ref 3.5–5.2)
Sodium: 139 mmol/L (ref 134–144)

## 2020-08-06 LAB — HEMOGLOBIN A1C
Est. average glucose Bld gHb Est-mCnc: 117 mg/dL
Hgb A1c MFr Bld: 5.7 % — ABNORMAL HIGH (ref 4.8–5.6)

## 2020-08-06 LAB — HEPATITIS C ANTIBODY: Hep C Virus Ab: <0.1 s/co ratio (ref 0.0–0.9)

## 2020-08-18 ENCOUNTER — Encounter: Payer: Self-pay | Admitting: Gastroenterology

## 2020-10-03 ENCOUNTER — Encounter: Payer: Self-pay | Admitting: Gastroenterology

## 2020-10-03 ENCOUNTER — Ambulatory Visit (AMBULATORY_SURGERY_CENTER): Payer: Self-pay

## 2020-10-03 ENCOUNTER — Other Ambulatory Visit: Payer: Self-pay

## 2020-10-03 ENCOUNTER — Encounter: Payer: Self-pay | Admitting: Internal Medicine

## 2020-10-03 VITALS — Ht 67.0 in | Wt 193.0 lb

## 2020-10-03 DIAGNOSIS — Z1211 Encounter for screening for malignant neoplasm of colon: Secondary | ICD-10-CM

## 2020-10-03 MED ORDER — NA SULFATE-K SULFATE-MG SULF 17.5-3.13-1.6 GM/177ML PO SOLN: 1.0000 | Freq: Once | ORAL | 0 refills | Status: AC

## 2020-10-03 NOTE — Progress Notes (Signed)
No egg or soy allergy known to patient  No issues with past sedation with any surgeries or procedures No intubation problems in the past  No FH of Malignant Hyperthermia No diet pills per patient No home 02 use per patient  No blood thinners per patient  Pt denies issues with constipation  No A fib or A flutter  EMMI video via [place] 19 guidelines implemented in PV today with Pt and RN  Coupon given to pt in PV today, Code to Pharmacy and  NO PA's for preps discussed with pt in PV today  Discussed with pt there will be an out-of-pocket cost for prep and that varies from $0 to 70 dollars;  Due to the COVID-19 pandemic we are asking patients to follow certain guidelines. Pt aware of COVID protocols and [facility] guidelines

## 2020-10-11 LAB — HM DIABETES EYE EXAM

## 2020-10-13 ENCOUNTER — Encounter: Payer: Self-pay | Admitting: Certified Registered Nurse Anesthetist

## 2020-10-14 ENCOUNTER — Other Ambulatory Visit: Payer: Self-pay

## 2020-10-14 ENCOUNTER — Encounter: Payer: Self-pay | Admitting: Gastroenterology

## 2020-10-14 ENCOUNTER — Ambulatory Visit (AMBULATORY_SURGERY_CENTER): Payer: Managed Care, Other (non HMO) | Admitting: Gastroenterology

## 2020-10-14 VITALS — BP 161/83 | HR 52 | Temp 96.9°F | Resp 13 | Ht 67.0 in | Wt 193.0 lb

## 2020-10-14 DIAGNOSIS — D128 Benign neoplasm of rectum: Secondary | ICD-10-CM

## 2020-10-14 DIAGNOSIS — K635 Polyp of colon: Secondary | ICD-10-CM

## 2020-10-14 DIAGNOSIS — Z1211 Encounter for screening for malignant neoplasm of colon: Secondary | ICD-10-CM

## 2020-10-14 DIAGNOSIS — K5730 Diverticulosis of large intestine without perforation or abscess without bleeding: Secondary | ICD-10-CM

## 2020-10-14 DIAGNOSIS — D123 Benign neoplasm of transverse colon: Secondary | ICD-10-CM

## 2020-10-14 DIAGNOSIS — D122 Benign neoplasm of ascending colon: Secondary | ICD-10-CM

## 2020-10-14 MED ORDER — SODIUM CHLORIDE 0.9 % IV SOLN
500.0000 mL | Freq: Once | INTRAVENOUS | Status: DC
Start: 2020-10-14 — End: 2023-06-12

## 2020-10-14 NOTE — Progress Notes (Signed)
Called to room to assist during endoscopic procedure.  Patient ID and intended procedure confirmed with present staff. Received instructions for my participation in the procedure from the performing physician.  

## 2020-10-14 NOTE — Op Note (Signed)
[facility] Patient Name: Tanya Wilcox Procedure Date: 10/14/2020 11:59 AM MRN: [id number] Endoscopist: Gerrit Heck , MD Age: 50 Referring MD:  Date of Birth: Mar 25, 1971 Gender: Female Account #: [id number] Procedure:                Colonoscopy Indications:              Screening for colorectal malignant neoplasm, This                            is the patient's first colonoscopy Medicines:                Monitored Anesthesia Care Procedure:                Pre-Anesthesia Assessment:                           - Prior to the procedure, a History and Physical                            was performed, and patient medications and                            allergies were reviewed. The patient's tolerance of                            previous anesthesia was also reviewed. The risks                            and benefits of the procedure and the sedation                            options and risks were discussed with the patient.                            All questions were answered, and informed consent                            was obtained. Prior Anticoagulants: The patient has                            taken no previous anticoagulant or antiplatelet                            agents. ASA Grade Assessment: II - A patient with                            mild systemic disease. After reviewing the risks                            and benefits, the patient was deemed in                            satisfactory condition to undergo the procedure.  After obtaining informed consent, the colonoscope                            was passed under direct vision. Throughout the                            procedure, the patient's blood pressure, pulse, and                            oxygen saturations were monitored continuously. The                            Olympus CF-HQ190 (#[id number]) Colonoscope was                            introduced through the anus and  advanced to the the                            terminal ileum. The colonoscopy was technically                            difficult and complex. The patient tolerated the                            procedure well. The quality of the bowel                            preparation was good. The terminal ileum, ileocecal                            valve, appendiceal orifice, and rectum were                            photographed. Scope In: 12:04:15 PM Scope Out: 1:06:43 PM Scope Withdrawal Time: 0 hours 59 minutes 0 seconds  Total Procedure Duration: 1 hour 2 minutes 28 seconds  Findings:                 The perianal and digital rectal examinations were                            normal.                           A 25 mm polyp was found in the ascending colon. The                            polyp was flat. 1st, the outer edges were marker                            with cautery using a closed snare tip, marking 2-3                            mm exterior to edges. Then, the polyp was removed  with a saline injection-lift technique using a hot                            snare. Resection and retrieval were complete in                            piecemeal fasion. The edges were then further                            resected with a cold forceps via avulsion                            technique. Area on the contralateral wall was                            tattooed with an injection of 1 mL of Spot (carbon                            black). There was a single small mucosal defect at                            the polypectomy site which was successfully closed                            using one hemostatic clip. There was no bleeding at                            the end of the procedure.                           Two sessile polyps were found in the rectum and                            transverse colon. The polyps were 3 to 4 mm in                            size.  These polyps were removed with a cold snare.                            Resection and retrieval were complete. Estimated                            blood loss was minimal.                           A few small-mouthed diverticula were found in the                            sigmoid colon and ascending colon.                           The retroflexed view of the distal rectum and anal  verge was normal and showed no anal or rectal                            abnormalities.                           The terminal ileum appeared normal. Complications:            No immediate complications. Estimated Blood Loss:     Estimated blood loss was minimal. Impression:               - One 25 mm polyp in the ascending colon, removed                            using injection-lift and a hot snare. Resected and                            retrieved. Biopsied. Tattooed. Clip was placed.                           - Two 3 to 4 mm polyps in the rectum and in the                            transverse colon, removed with a cold snare.                            Resected and retrieved.                           - Diverticulosis in the sigmoid colon and in the                            ascending colon.                           - The distal rectum and anal verge are normal on                            retroflexion view.                           - The examined portion of the ileum was normal. Recommendation:           - Patient has a contact number available for                            emergencies. The signs and symptoms of potential                            delayed complications were discussed with the                            patient. Return to normal activities tomorrow.  Written discharge instructions were provided to the                            patient.                           - Resume previous diet.                           - Continue  present medications.                           - Await pathology results.                           - Repeat colonoscopy in 6 months for surveillance                            after piecemeal polypectomy. This should be                            scheduled at [facility].                           - Return to GI office PRN. Gerrit Heck, MD 10/14/2020 1:17:34 PM

## 2020-10-14 NOTE — Patient Instructions (Signed)
Clip Card provided  Handouts Provided:  Polyps and Diverticulosis  YOU HAD AN ENDOSCOPIC PROCEDURE TODAY AT [facility]:   Refer to the procedure report that was given to you for any specific questions about what was found during the examination.  If the procedure report does not answer your questions, please call your gastroenterologist to clarify.  If you requested that your care partner not be given the details of your procedure findings, then the procedure report has been included in a sealed envelope for you to review at your convenience later.  YOU SHOULD EXPECT: Some feelings of bloating in the abdomen. Passage of more gas than usual.  Walking can help get rid of the air that was put into your GI tract during the procedure and reduce the bloating. If you had a lower endoscopy (such as a colonoscopy or flexible sigmoidoscopy) you may notice spotting of blood in your stool or on the toilet paper. If you underwent a bowel prep for your procedure, you may not have a normal bowel movement for a few days.  Please Note:  You might notice some irritation and congestion in your nose or some drainage.  This is from the oxygen used during your procedure.  There is no need for concern and it should clear up in a day or so.  SYMPTOMS TO REPORT IMMEDIATELY:   Following lower endoscopy (colonoscopy or flexible sigmoidoscopy):  Excessive amounts of blood in the stool  Significant tenderness or worsening of abdominal pains  Swelling of the abdomen that is new, acute  Fever of 100F or higher  For urgent or emergent issues, a gastroenterologist can be reached at any hour by calling [phone]. Do not use MyChart messaging for urgent concerns.    DIET:  We do recommend a small meal at first, but then you may proceed to your regular diet.  Drink plenty of fluids but you should avoid alcoholic beverages for 24 hours.  ACTIVITY:  You should plan to take it easy for the rest of today  and you should NOT DRIVE or use heavy machinery until tomorrow (because of the sedation medicines used during the test).    FOLLOW UP: Our staff will call the number listed on your records 48-72 hours following your procedure to check on you and address any questions or concerns that you may have regarding the information given to you following your procedure. If we do not reach you, we will leave a message.  We will attempt to reach you two times.  During this call, we will ask if you have developed any symptoms of COVID 19. If you develop any symptoms (ie: fever, flu-like symptoms, shortness of breath, cough etc.) before then, please call [phone].  If you test positive for Covid 19 in the 2 weeks post procedure, please call and report this information to [place].    If any biopsies were taken you will be contacted by phone or by letter within the next 1-3 weeks.  Please call us at [phone] if you have not heard about the biopsies in 3 weeks.    SIGNATURES/CONFIDENTIALITY: You and/or your care partner have signed paperwork which will be entered into your electronic medical record.  These signatures attest to the fact that that the information above on your After Visit Summary has been reviewed and is understood.  Full responsibility of the confidentiality of this discharge information lies with you and/or your care-partner.

## 2020-10-14 NOTE — Progress Notes (Signed)
VS by Holt  Pt's states no medical or surgical changes since previsit or office visit.  

## 2020-10-14 NOTE — Progress Notes (Signed)
Report given to PACU, vss 

## 2020-10-18 ENCOUNTER — Telehealth: Payer: Self-pay | Admitting: *Deleted

## 2020-10-18 NOTE — Telephone Encounter (Signed)
  Follow up Call-  Call back number 10/14/2020  Post procedure Call Back phone  # [phone]  Permission to leave phone message Yes  Some recent data might be hidden     Patient questions:  Do you have a fever, pain , or abdominal swelling? No. Pain Score  0 *  Have you tolerated food without any problems? Yes.    Have you been able to return to your normal activities? Yes.    Do you have any questions about your discharge instructions: Diet   No. Medications  No. Follow up visit  No.  Do you have questions or concerns about your Care? No.  Actions: * If pain score is 4 or above: 1. No action needed, pain <4.Have you developed a fever since your procedure? no  2.   Have you had an respiratory symptoms (SOB or cough) since your procedure? no  3.   Have you tested positive for COVID 19 since your procedure no  4.   Have you had any family members/close contacts diagnosed with the COVID 19 since your procedure?  no   If yes to any of these questions please route to Joylene John, RN and Joella Prince, RN

## 2020-10-26 ENCOUNTER — Encounter: Payer: Self-pay | Admitting: Gastroenterology

## 2020-11-09 ENCOUNTER — Other Ambulatory Visit: Payer: Self-pay

## 2020-11-09 ENCOUNTER — Ambulatory Visit (INDEPENDENT_AMBULATORY_CARE_PROVIDER_SITE_OTHER): Payer: Managed Care, Other (non HMO) | Admitting: Family

## 2020-11-09 ENCOUNTER — Encounter: Payer: Self-pay | Admitting: Family

## 2020-11-09 VITALS — BP 150/84 | HR 86 | Temp 97.5°F | Resp 18 | Ht 67.0 in | Wt 190.0 lb

## 2020-11-09 DIAGNOSIS — Z9884 Bariatric surgery status: Secondary | ICD-10-CM

## 2020-11-09 DIAGNOSIS — Z87891 Personal history of nicotine dependence: Secondary | ICD-10-CM

## 2020-11-09 DIAGNOSIS — R6889 Other general symptoms and signs: Secondary | ICD-10-CM

## 2020-11-09 DIAGNOSIS — F419 Anxiety disorder, unspecified: Secondary | ICD-10-CM

## 2020-11-09 DIAGNOSIS — Z0000 Encounter for general adult medical examination without abnormal findings: Secondary | ICD-10-CM | POA: Diagnosis not present

## 2020-11-09 DIAGNOSIS — F32A Depression, unspecified: Secondary | ICD-10-CM

## 2020-11-09 DIAGNOSIS — A6000 Herpesviral infection of urogenital system, unspecified: Secondary | ICD-10-CM

## 2020-11-09 DIAGNOSIS — I10 Essential (primary) hypertension: Secondary | ICD-10-CM

## 2020-11-09 DIAGNOSIS — E118 Type 2 diabetes mellitus with unspecified complications: Secondary | ICD-10-CM

## 2020-11-09 NOTE — Assessment & Plan Note (Signed)
A1C is stable. Pt is requesting referral to nutrition.

## 2020-11-09 NOTE — Assessment & Plan Note (Signed)
States she saw audiology and her hearing test was normal.

## 2020-11-09 NOTE — Patient Instructions (Addendum)
Please complete lab work prior to leaving. You should be contacted about scheduling your appointment with the nutritionist.   Please get your covid-19 booster.

## 2020-11-09 NOTE — Assessment & Plan Note (Signed)
Stable, management per psychiatry.

## 2020-11-09 NOTE — Assessment & Plan Note (Signed)
Discussed importance of smoking cessation. 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  Advise pt to start by only smoking outside to cut down and then initiate nicotine patch when she is ready.

## 2020-11-09 NOTE — Assessment & Plan Note (Signed)
BP elevated today. Pt has not taken AM BP medication. Advised pt to check bp once daily for 1 week and then send me her readings via mychart.

## 2020-11-09 NOTE — Progress Notes (Signed)
Subjective:   By signing my name below, I, [name], attest that this documentation has been prepared under the direction and in the presence of Debbrah Alar. 11/09/2020     Patient ID: Tanya Wilcox, female    DOB: 01/11/1971, 50 y.o.   MRN: [id number]  No chief complaint on file.   HPI Patient is in today for a comprehensive physical exam. She complaining of being constantly cold. She thinks her issue is anemia. She stopped taking 20 mg Lasix daily PO for managing her swelling. She reports she smokes 1 and a half packs of cigarettes a day. She smokes marijuana. She denies having any bladder issues, joint pain, headaches, and hematuria at this time.  Depression- She is managing her depression by taking 500 mg valtrex daily PO. She alsocmentioned that she stopped going to psychiatrist because their treatment has not improved her symptoms.  Immunizations: She has taken the Covid 19 vaccinations but has not taken any Covid 19 booster vaccinations. She is UTD on the Tetanus vaccinations. Diet: She is complaining of fatigue and would like to see a nutritionist to improve this issue by managing her diet and increase her protein intake. Exercise: She participates in moderate exercise by walking 3 miles several times a week. Colonoscopy: Last completed 10/14/2020. 3 polyps and diverticulosis was found. Repeat in 6 months. Dexa: Not yet completed. Pap Smear: Last completed 10/11/2014.  Mammogram: Last completed 04/30/2019. Results normal.  Dental: Not yet completed. Vision: UTD on vision.   Past Medical History:  Diagnosis Date  . Alcohol use   . Anxiety    on meds  . Bipolar disorder ([place])   . Depression    on meds  . Diabetes ([place])    TYPE 2 ---NO MEDS -- diet controlled  . Gestational diabetes    borderline II  . Hyperlipidemia    on meds  . Hypertension    on meds  . Osteoarthritis    RIGHT knee  . Tobacco abuse     Past Surgical History:  Procedure Laterality  Date  . CHOLECYSTECTOMY  2003  . GASTRIC ROUX-EN-Y N/A 06/29/2019   Procedure: LAPAROSCOPIC ROUX-EN-Y GASTRIC BYPASS WITH UPPER ENDOSCOPY, ERAS PATHWAY;  Surgeon: Alphonsa Overall, MD;  Location: [facility];  Service: General;  Laterality: N/A;  . WISDOM TOOTH EXTRACTION      Family History  Problem Relation Age of Onset  . Hypertension Mother   . Hyperlipidemia Mother   . Colon polyps Mother 34  . Alcohol abuse Father   . Diabetes Neg Hx   . Colon cancer Neg Hx   . Rectal cancer Neg Hx   . Stomach cancer Neg Hx   . Esophageal cancer Neg Hx     Social History   Socioeconomic History  . Marital status: Single    Spouse name: Not on file  . Number of children: Not on file  . Years of education: Not on file  . Highest education level: Not on file  Occupational History  . Occupation: Sport and exercise psychologist: [employer]  Tobacco Use  . Smoking status: Current Every Day Smoker    Packs/day: 1.50    Types: Cigarettes  . Smokeless tobacco: Never Used  Vaping Use  . Vaping Use: Never used  Substance and Sexual Activity  . Alcohol use: Yes    Alcohol/week: 2.0 standard drinks    Types: 2 Standard drinks or equivalent per week    Comment: occasionally  . Drug use: No  .  Sexual activity: Not on file  Other Topics Concern  . Not on file  Social History Narrative   Lives with daughter   Works at [employer] and [employer]   Enjoys sleeping   1 dog   Completed high school, some college   Social Determinants of Health   Financial Resource Strain: Not on file  Food Insecurity: Not on file  Transportation Needs: Not on file  Physical Activity: Not on file  Stress: Not on file  Social Connections: Not on file  Intimate Partner Violence: Not on file    Outpatient Medications Prior to Visit  Medication Sig Dispense Refill  . ALPRAZolam (XANAX) 0.5 MG tablet Take 0.5 mg by mouth 2 (two) times daily as needed for anxiety.   1  . ARIPiprazole (ABILIFY) 5 MG tablet Take 5 mg by mouth  daily.   1  . Blood Glucose Monitoring Suppl (ONETOUCH VERIO) w/Device KIT USE TO TEST BLOOD SUGAR THREE TIMES DAILY 1 kit 0  . Calcium Carbonate (CALCI-CHEW PO) Take 1 tablet by mouth 3 (three) times daily. Chew one calci-chew three times daily.    . furosemide (LASIX) 20 MG tablet Take 1 tablet (20 mg total) by mouth daily as needed. 30 tablet 0  . gabapentin (NEURONTIN) 100 MG capsule Take 1 capsule (100 mg total) by mouth 3 (three) times daily. 270 capsule 1  . levonorgestrel (MIRENA) 20 MCG/24HR IUD 1 each by Intrauterine route once.    Marland Kitchen lisinopril (ZESTRIL) 20 MG tablet Take 1 tablet (20 mg total) by mouth daily. 90 tablet 3  . Multiple Vitamins-Minerals (BARIATRIC MULTIVITAMINS/IRON PO) Take 2 tablets by mouth 2 (two) times daily.    Glory Rosebush VERIO test strip 3 (three) times daily.    . sertraline (ZOLOFT) 25 MG tablet Take 1 tablet (25 mg total) by mouth daily.  1  . traZODone (DESYREL) 50 MG tablet Take 50-100 mg by mouth at bedtime.    . valACYclovir (VALTREX) 500 MG tablet Take 1 tablet (500 mg total) by mouth daily. 90 tablet 1   Facility-Administered Medications Prior to Visit  Medication Dose Route Frequency Provider Last Rate Last Admin  . 0.9 %  sodium chloride infusion  500 mL Intravenous Once Cirigliano, Vito V, DO        No Known Allergies  Review of Systems  Genitourinary: Negative for hematuria.  Musculoskeletal: Negative for joint pain.  Neurological: Negative for headaches.       Objective:    Physical Exam Constitutional:      Appearance: She is well-developed.  HENT:     Right Ear: External ear normal.     Left Ear: External ear normal.  Eyes:     Extraocular Movements: Extraocular movements intact.     Pupils: Pupils are equal, round, and reactive to light.     Comments: No nystagmus  Neck:     Thyroid: No thyromegaly.  Cardiovascular:     Rate and Rhythm: Normal rate and regular rhythm.     Pulses: Normal pulses.     Heart sounds: Normal heart  sounds. No murmur heard.   Pulmonary:     Effort: Pulmonary effort is normal. No respiratory distress.     Breath sounds: Normal breath sounds. No wheezing.  Abdominal:     General: Bowel sounds are normal.     Palpations: Abdomen is soft.     Tenderness: There is no abdominal tenderness.  Musculoskeletal:     Cervical back: Neck supple.  Comments: 5/5 strength in upper and lower extremities  Skin:    General: Skin is warm and dry.     Comments: Pallor noted bilateral hands- palmar surface.  Neurological:     Mental Status: She is alert and oriented to person, place, and time.  Psychiatric:        Attention and Perception: Attention normal.        Mood and Affect: Mood normal.        Speech: Speech normal.        Behavior: Behavior normal.        Thought Content: Thought content normal.        Judgment: Judgment normal.     There were no vitals taken for this visit. Wt Readings from Last 3 Encounters:  10/14/20 193 lb (87.5 kg)  10/03/20 193 lb (87.5 kg)  08/05/20 190 lb (86.2 kg)    Diabetic Foot Exam - Simple   No data filed    Lab Results  Component Value Date   WBC 15.0 (H) 06/30/2019   HGB 12.2 06/30/2019   HCT 36.9 06/30/2019   PLT 309 06/30/2019   GLUCOSE 131 (H) 08/05/2020   CHOL 144 12/09/2019   TRIG 77.0 12/09/2019   HDL 40.70 12/09/2019   LDLCALC 88 12/09/2019   ALT 33 09/08/2019   AST 19 09/08/2019   NA 139 08/05/2020   K 5.1 08/05/2020   CL 103 08/05/2020   CREATININE 0.83 08/05/2020   BUN 16 08/05/2020   CO2 24 08/05/2020   TSH 1.31 10/01/2018   HGBA1C 5.7 (H) 08/05/2020   MICROALBUR <0.7 12/11/2019    Lab Results  Component Value Date   TSH 1.31 10/01/2018   Lab Results  Component Value Date   WBC 15.0 (H) 06/30/2019   HGB 12.2 06/30/2019   HCT 36.9 06/30/2019   MCV 98.9 06/30/2019   PLT 309 06/30/2019   Lab Results  Component Value Date   NA 139 08/05/2020   K 5.1 08/05/2020   CO2 24 08/05/2020   GLUCOSE 131 (H)  08/05/2020   BUN 16 08/05/2020   CREATININE 0.83 08/05/2020   BILITOT 0.3 09/08/2019   ALKPHOS 65 09/08/2019   AST 19 09/08/2019   ALT 33 09/08/2019   PROT 7.0 09/08/2019   ALBUMIN 4.1 09/08/2019   CALCIUM 9.5 08/05/2020   ANIONGAP 10 06/25/2019   GFR 76.67 12/09/2019   Lab Results  Component Value Date   CHOL 144 12/09/2019   Lab Results  Component Value Date   HDL 40.70 12/09/2019   Lab Results  Component Value Date   LDLCALC 88 12/09/2019   Lab Results  Component Value Date   TRIG 77.0 12/09/2019   Lab Results  Component Value Date   CHOLHDL 4 12/09/2019   Lab Results  Component Value Date   HGBA1C 5.7 (H) 08/05/2020       Assessment & Plan:   Problem List Items Addressed This Visit   None      No orders of the defined types were placed in this encounter.   I, Shehryar [place], personally preformed the services described in this documentation.  All medical record entries made by the scribe were at my direction and in my presence.  I have reviewed the chart and discharge instructions (if applicable) and agree that the record reflects my personal performance and is accurate and complete. 11/09/2020   I,[name],acting as a scribe for Nance Pear, NP.,have documented all relevant documentation on the behalf  of Nance Pear, NP,as directed by  Nance Pear, NP while in the presence of Nance Pear, NP.    Shehryar Salinas, NP, have reviewed all documentation for this visit. The documentation on 11/09/20 for the exam, diagnosis, procedures, and orders are all accurate and complete.

## 2020-11-09 NOTE — Assessment & Plan Note (Signed)
Stable on valtrex 500mg  once daily. No breakouts.

## 2020-11-09 NOTE — Assessment & Plan Note (Signed)
Discussed healthy diet, exercise.  She will schedule pap and mammo with gyn. Colo is up to date. Advised pt to get Covid Booster.

## 2020-11-10 LAB — B12 AND FOLATE PANEL
Folate: 15.6 ng/mL
Vitamin B-12: 811 pg/mL (ref 232–1245)

## 2020-11-10 LAB — CBC WITH DIFFERENTIAL/PLATELET
Basophils Absolute: 0 x10E3/uL (ref 0.0–0.2)
Basos: 1 %
EOS (ABSOLUTE): 0.1 x10E3/uL (ref 0.0–0.4)
Eos: 1 %
Hematocrit: 42.8 % (ref 34.0–46.6)
Hemoglobin: 14.8 g/dL (ref 11.1–15.9)
Immature Grans (Abs): 0 x10E3/uL (ref 0.0–0.1)
Immature Granulocytes: 0 %
Lymphocytes Absolute: 1.5 x10E3/uL (ref 0.7–3.1)
Lymphs: 28 %
MCH: 34.1 pg — ABNORMAL HIGH (ref 26.6–33.0)
MCHC: 34.6 g/dL (ref 31.5–35.7)
MCV: 99 fL — ABNORMAL HIGH (ref 79–97)
Monocytes Absolute: 0.5 x10E3/uL (ref 0.1–0.9)
Monocytes: 10 %
Neutrophils Absolute: 3.2 x10E3/uL (ref 1.4–7.0)
Neutrophils: 60 %
Platelets: 377 x10E3/uL (ref 150–450)
RBC: 4.34 x10E6/uL (ref 3.77–5.28)
RDW: 11.2 % — ABNORMAL LOW (ref 11.7–15.4)
WBC: 5.4 x10E3/uL (ref 3.4–10.8)

## 2020-11-10 LAB — COMPREHENSIVE METABOLIC PANEL
ALT: 17 IU/L (ref 0–32)
AST: 18 IU/L (ref 0–40)
Albumin/Globulin Ratio: 1.7 (ref 1.2–2.2)
Albumin: 4.6 g/dL (ref 3.8–4.8)
Alkaline Phosphatase: 81 IU/L (ref 44–121)
BUN/Creatinine Ratio: 12 (ref 9–23)
BUN: 11 mg/dL (ref 6–24)
Bilirubin Total: 0.2 mg/dL (ref 0.0–1.2)
CO2: 18 mmol/L — ABNORMAL LOW (ref 20–29)
Calcium: 9.2 mg/dL (ref 8.7–10.2)
Chloride: 101 mmol/L (ref 96–106)
Creatinine, Ser: 0.9 mg/dL (ref 0.57–1.00)
Globulin, Total: 2.7 g/dL (ref 1.5–4.5)
Glucose: 91 mg/dL (ref 65–99)
Potassium: 4.4 mmol/L (ref 3.5–5.2)
Sodium: 139 mmol/L (ref 134–144)
Total Protein: 7.3 g/dL (ref 6.0–8.5)
eGFR: 78 mL/min/{1.73_m2} (ref >59)

## 2020-11-10 LAB — VITAMIN D 25 HYDROXY (VIT D DEFICIENCY, FRACTURES): Vit D, 25-Hydroxy: 43.7 ng/mL (ref 30.0–100.0)

## 2020-11-10 LAB — HEMOGLOBIN A1C
Est. average glucose Bld gHb Est-mCnc: 120 mg/dL
Hgb A1c MFr Bld: 5.8 % — ABNORMAL HIGH (ref 4.8–5.6)

## 2020-11-10 LAB — TSH: TSH: 2.08 u[IU]/mL (ref 0.450–4.500)

## 2020-11-18 ENCOUNTER — Encounter: Payer: Self-pay | Admitting: Family

## 2020-11-20 MED ORDER — METOPROLOL SUCCINATE ER 50 MG PO TB24: 50.0000 mg | ORAL_TABLET | Freq: Every day | ORAL | 0 refills | Status: DC

## 2020-12-12 ENCOUNTER — Other Ambulatory Visit: Payer: Self-pay

## 2020-12-12 ENCOUNTER — Encounter: Payer: Managed Care, Other (non HMO) | Attending: Family | Admitting: Skilled Nursing Facility1

## 2020-12-12 DIAGNOSIS — E118 Type 2 diabetes mellitus with unspecified complications: Secondary | ICD-10-CM | POA: Insufficient documentation

## 2020-12-12 DIAGNOSIS — E669 Obesity, unspecified: Secondary | ICD-10-CM | POA: Insufficient documentation

## 2020-12-12 NOTE — Progress Notes (Signed)
Bariatric Nutrition Follow-Up Visit Medical Nutrition Therapy   NUTRITION ASSESSMENT  RYGB   Surgery: 06/29/2019  Anthropometrics  Start weight at [facility]: 245.2 lbs (date: 03/29/2019) Today's weight: 191 lbs  Body Composition Scale Date 11/24/2019 12/23/2019 12/12/2020  Weight  lbs 219.6 200.1 196.8 191.9  Total Body Fat  % 40.2 37.3 36.8 36.1     Visceral Fat 12 10 10    Fat-Free Mass  % 59.7 62.6 63.1      Total Body Water  % 44.3 45.8 46 46.4     Muscle-Mass  lbs 33.5 33.3 33.2   BMI 34.1 31.1 30.5   Body Fat Displacement ---           Torso  lbs 54.6 46.3 44.8         Left Leg  lbs 10.9 9.2 8.9         Right Leg  lbs 10.9 9.2 8.9         Left Arm  lbs 5.4 4.6 4.4         Right Arm  lbs 5.4 4.6 4.4    Clinical  Medical hx: HTN, GERD, DM type 2, bipolar Medications: zoloft, abilify  Labs:    Lifestyle & Dietary Hx  Pt states she was prescribed bupropion to stop smoking which she feels has been helping (smoked through the weekend stating she is going to finish her pack and then quit: Dietitian encouraged pt to throw this pack away). Pt states she has been moving more. Pt states she wants to be 170 pounds. Pt states she checks her blood sugars here and there getting numbers around 93-105, checking once a week. Pt states she typically only eats 1 meal on the weekend. Pt states she drinks a lot of red bull. Pt states she was drinking a lot of soda but uses a soda stream now. Pt states she just started a 30 day challenge which are exercise videos. Pt states she weighs daily. Pt states she enjoys bowling. 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    Estimated daily fluid intake: 40.9 oz Estimated daily protein intake: 60+ g Medications: A1C 5.8, B12 811, vitamin 43.7 Supplements: bari fusion and no calcium (taken all at once in the morning) Current average weekly physical activity: walking 3 miles 2-3 days a  week  24-Hr Dietary Recall First Meal 9:30-10am: avocado tomato on sandwich thin or boiled egg or skipped Snack: pork skins or almonds  Second Meal 2pm: skipped or chicken or salad: lettuce, tomato, cucumber, peppers, onions, [place] lunch meat, bacon bits, cheese, boiled egg + skinny girl dressing or fat free [place]  Snack: sugar free popcicle or chips Third Meal: shrimp alfredo with pasta or fast food Snack:  Beverages: coffee + zero sugar syrup 12 ounces, 3 16.9 ounces soda stream water, sugar free red bull, 2 drinks while bowling   Post-Op Goals/ Signs/ Symptoms Using straws: no Drinking while eating: no Chewing/swallowing difficulties: no Changes in vision: no Changes to mood/headaches: no Hair loss/changes to skin/nails: no Difficulty focusing/concentrating: no Sweating: no Dizziness/lightheadedness: no Palpitations: no  Carbonated/caffeinated beverages: no N/V/D/C/Gas: every other day bowel movement   Abdominal pain: no Dumping syndrome: no    NUTRITION DIAGNOSIS  Overweight/obesity ([place]-3.3) related to past poor dietary habits and physical inactivity as evidenced by completed bariatric surgery and following dietary guidelines for continued weight loss and healthy nutrition status.     NUTRITION  INTERVENTION Nutrition counseling (C-1) and education (E-2) to facilitate bariatric surgery goals, including: . The importance of consuming adequate calories as well as certain nutrients daily due to the body's need for essential vitamins, minerals, and fats . The importance of daily physical activity and to reach a goal of at least 150 minutes of moderate to vigorous physical activity weekly (or as directed by their physician) due to benefits such as increased musculature and improved lab values . The importance of intuitive eating specifically learning hunger-satiety cues and understanding the importance of learning a new body Encouraged patient to honor their body's internal  hunger and fullness cues.  Throughout the day, check in mentally and rate hunger. Stop eating when satisfied not full regardless of how much food is left on the plate.  Get more if still hungry 20-30 minutes later.  The key is to honor satisfaction so throughout the meal, rate fullness factor and stop when comfortably satisfied not physically full. The key is to honor hunger and fullness without any feelings of guilt or shame.  Pay attention to what the internal cues are, rather than any external factors. This will enhance the confidence you have in listening to your own body and following those internal cues enabling you to increase how often you eat when you are hungry not out of appetite and stop when you are satisfied not full.  Encouraged pt to continue to eat balanced meals inclusive of non starchy vegetables 2 times a day 7 days a week Encouraged pt to choose lean protein sources: limiting beef, pork, sausage, hotdogs, and lunch meat Encourage pt to choose healthy fats such as plant based limiting animal fats . Encouraged pt to continue to drink a minium 64 fluid ounces with half being plain water to satisfy proper hydration  Goals: -Take your multi vitamin and all calcium at least 2 hours apart -Aim for a minimum 2 bottles plain water per day -Eat around every 5 hours starting within 1-1.5 hours of waking: such as yogurt at 7am, lunch at 12-1 like your salad, dinner 6-7pm; for your Mondays and Wendsdays the night before those days make enough dinner to cover you  -When making dinner go ahead and pack up what you need for the next day so it doe snot get gone    Handouts Provided Include   All maintainance phase handouts   Learning Style & Readiness for Change Teaching method utilized: Visual & Auditory  Demonstrated degree of understanding via: Teach Back  Barriers to learning/adherence to lifestyle change: inappropriate stress relief mechanisms   RD's Notes for Next Visit . Assess  adherence to pt chosen goals    MONITORING & EVALUATION Dietary intake, weekly physical activity, body weight  Next Steps Patient is to follow-up in 6 weeks

## 2020-12-23 ENCOUNTER — Other Ambulatory Visit: Payer: Self-pay

## 2020-12-23 ENCOUNTER — Ambulatory Visit: Payer: Managed Care, Other (non HMO) | Admitting: Family

## 2020-12-23 ENCOUNTER — Encounter: Payer: Self-pay | Admitting: Family

## 2020-12-23 DIAGNOSIS — F32A Depression, unspecified: Secondary | ICD-10-CM | POA: Diagnosis not present

## 2020-12-23 DIAGNOSIS — I10 Essential (primary) hypertension: Secondary | ICD-10-CM

## 2020-12-23 DIAGNOSIS — F419 Anxiety disorder, unspecified: Secondary | ICD-10-CM | POA: Diagnosis not present

## 2020-12-23 DIAGNOSIS — Z87891 Personal history of nicotine dependence: Secondary | ICD-10-CM | POA: Diagnosis not present

## 2020-12-23 MED ORDER — SERTRALINE HCL 25 MG PO TABS: 25.0000 mg | ORAL_TABLET | Freq: Every day | ORAL | 1 refills | Status: DC

## 2020-12-23 MED ORDER — FUROSEMIDE 20 MG PO TABS: 20.0000 mg | ORAL_TABLET | Freq: Every day | ORAL | 5 refills | Status: DC | PRN

## 2020-12-23 NOTE — Assessment & Plan Note (Signed)
She reports that she stopped lisinopril when she started toprol xl.  BP looks good today. Advised pt to check bp once daily at home for the next 3 days and send me her readings on Monday. If high readings at home plan to restart lisinopril 20mg  once daily.

## 2020-12-23 NOTE — Progress Notes (Signed)
Subjective:   By signing my name below, I, Tanya Wilcox, attest that this documentation has been prepared under the direction and in the presence of Tanya Alar NP. 12/23/2020    Patient ID: Tanya Wilcox, female    DOB: [birth year], 50 y.o.   MRN: [id number]  No chief complaint on file.   HPI Patient is in today for a office visit.   HCC/Anxiety- She stopped taking trazodone PO because she read on the Internet that it may contribute to her high blood pressure. She continues taking Wellbutrin to help her quit smoking. She reports having reduced cravings for smoking while on Wellbutrin PO.  Hypertension-  She reports having high blood pressure multiple time recently. Her systolic BP usually measured around 180 while her diastolic usually measured around 80-100. She continues to take metoprolol succinate but stopped taking 20 mg lisinopril daily PO . She denies any headaches.  Her leg swelling has improved since last visit. She is requesting a refill for 20 mg lasix daily PO.   Health Maintenance Due  Topic Date Due  . Pneumococcal Vaccine 42-54 Years old (1 of 2 - PPSV23) Never done  . PAP SMEAR-Modifier  11/21/2019  . COVID-19 Vaccine (3 - Booster for Pfizer series) 04/11/2020  . MAMMOGRAM  04/29/2020    Past Medical History:  Diagnosis Date  . Alcohol use   . Anxiety    on meds  . Bipolar disorder ([place])   . Depression    on meds  . Diabetes ([place])    TYPE 2 ---NO MEDS -- diet controlled  . Gestational diabetes    borderline II  . Hyperlipidemia    on meds  . Hypertension    on meds  . Osteoarthritis    RIGHT knee  . Tobacco abuse     Past Surgical History:  Procedure Laterality Date  . CHOLECYSTECTOMY  2003  . GASTRIC ROUX-EN-Y N/A 06/29/2019   Procedure: LAPAROSCOPIC ROUX-EN-Y GASTRIC BYPASS WITH UPPER ENDOSCOPY, ERAS PATHWAY;  Surgeon: Alphonsa Overall, MD;  Location: [facility];  Service: General;  Laterality: N/A;  . WISDOM TOOTH EXTRACTION      Family  History  Problem Relation Age of Onset  . Hypertension Mother   . Hyperlipidemia Mother   . Colon polyps Mother 17  . Alcohol abuse Father   . Retinal detachment Daughter   . Hyperlipidemia Sister   . Hypertension Sister   . Hypertension Brother   . Diabetes Neg Hx   . Colon cancer Neg Hx   . Rectal cancer Neg Hx   . Stomach cancer Neg Hx   . Esophageal cancer Neg Hx     Social History   Socioeconomic History  . Marital status: Single    Spouse name: Not on file  . Number of children: Not on file  . Years of education: Not on file  . Highest education level: Not on file  Occupational History  . Occupation: Sport and exercise psychologist: [employer]  Tobacco Use  . Smoking status: Current Every Day Smoker    Packs/day: 1.50    Types: Cigarettes  . Smokeless tobacco: Never Used  Vaping Use  . Vaping Use: Never used  Substance and Sexual Activity  . Alcohol use: Yes    Alcohol/week: 2.0 standard drinks    Types: 2 Standard drinks or equivalent per week    Comment: occasionally  . Drug use: No    Comment: some marijuana  . Sexual activity: Not on file  Other Topics Concern  . Not on file  Social History Narrative   Lives with daughter   Works at [employer] and [employer]   Enjoys sleeping   1 dog   Completed high school, some college   Social Determinants of Health   Financial Resource Strain: Not on file  Food Insecurity: Not on file  Transportation Needs: Not on file  Physical Activity: Not on file  Stress: Not on file  Social Connections: Not on file  Intimate Partner Violence: Not on file    Outpatient Medications Prior to Visit  Medication Sig Dispense Refill  . ALPRAZolam (XANAX) 0.5 MG tablet Take 0.5 mg by mouth 2 (two) times daily as needed for anxiety.   1  . ARIPiprazole (ABILIFY) 5 MG tablet Take 5 mg by mouth daily.   1  . Blood Glucose Monitoring Suppl (ONETOUCH VERIO) w/Device KIT USE TO TEST BLOOD SUGAR THREE TIMES DAILY 1 kit 0  . Calcium Carbonate  (CALCI-CHEW PO) Take 1 tablet by mouth 3 (three) times daily. Chew one calci-chew three times daily.    . furosemide (LASIX) 20 MG tablet Take 1 tablet (20 mg total) by mouth daily as needed. 30 tablet 0  . gabapentin (NEURONTIN) 100 MG capsule Take 1 capsule (100 mg total) by mouth 3 (three) times daily. 270 capsule 1  . levonorgestrel (MIRENA) 20 MCG/24HR IUD 1 each by Intrauterine route once.    Marland Kitchen lisinopril (ZESTRIL) 20 MG tablet Take 1 tablet (20 mg total) by mouth daily. 90 tablet 3  . metoprolol succinate (TOPROL-XL) 50 MG 24 hr tablet Take 1 tablet (50 mg total) by mouth daily. Take with or immediately following a meal. 90 tablet 0  . Multiple Vitamins-Minerals (BARIATRIC MULTIVITAMINS/IRON PO) Take 2 tablets by mouth 2 (two) times daily.    Glory Rosebush VERIO test strip 3 (three) times daily.    . sertraline (ZOLOFT) 25 MG tablet Take 1 tablet (25 mg total) by mouth daily.  1  . traZODone (DESYREL) 50 MG tablet Take 50-100 mg by mouth at bedtime.    . valACYclovir (VALTREX) 500 MG tablet Take 1 tablet (500 mg total) by mouth daily. 90 tablet 1   Facility-Administered Medications Prior to Visit  Medication Dose Route Frequency Provider Last Rate Last Admin  . 0.9 %  sodium chloride infusion  500 mL Intravenous Once Cirigliano, Vito V, DO        No Known Allergies  Review of Systems  Neurological: Negative for headaches.       Objective:    Physical Exam Constitutional:      General: She is not in acute distress.    Appearance: Normal appearance. She is not ill-appearing.  HENT:     Head: Normocephalic and atraumatic.     Right Ear: External ear normal.     Left Ear: External ear normal.  Eyes:     Extraocular Movements: Extraocular movements intact.     Pupils: Pupils are equal, round, and reactive to light.  Cardiovascular:     Rate and Rhythm: Normal rate and regular rhythm.     Pulses: Normal pulses.     Heart sounds: Normal heart sounds. No murmur heard. No gallop.    Pulmonary:     Effort: Pulmonary effort is normal. No respiratory distress.     Breath sounds: Normal breath sounds. No wheezing, rhonchi or rales.  Skin:    General: Skin is warm and dry.  Neurological:     Mental Status:  She is alert and oriented to person, place, and time.  Psychiatric:        Behavior: Behavior normal.     There were no vitals taken for this visit. Wt Readings from Last 3 Encounters:  12/12/20 191 lb 14.4 oz (87 kg)  11/09/20 190 lb (86.2 kg)  10/14/20 193 lb (87.5 kg)       Assessment & Plan:   Problem List Items Addressed This Visit   None      No orders of the defined types were placed in this encounter.   I, Tanya Alar NP, personally preformed the services described in this documentation.  All medical record entries made by the scribe were at my direction and in my presence.  I have reviewed the chart and discharge instructions (if applicable) and agree that the record reflects my personal performance and is accurate and complete. 12/23/2020   I,Tanya Wilcox,acting as a Education administrator for Nance Pear, NP.,have documented all relevant documentation on the behalf of Nance Pear, NP,as directed by  Nance Pear, NP while in the presence of Nance Pear, NP.   Tanya [employer]

## 2020-12-23 NOTE — Assessment & Plan Note (Signed)
Stable. She continues to follow with psychiatry.

## 2020-12-23 NOTE — Assessment & Plan Note (Signed)
She is now on chantix per psychiatry and is working on quitting. She does report decrease in tobacco cravings since she started.

## 2020-12-29 ENCOUNTER — Encounter: Payer: Self-pay | Admitting: Family

## 2020-12-29 MED ORDER — LISINOPRIL 20 MG PO TABS: 10.0000 mg | ORAL_TABLET | Freq: Every day | ORAL | 3 refills | Status: DC

## 2021-01-06 ENCOUNTER — Other Ambulatory Visit: Payer: Self-pay | Admitting: Family

## 2021-01-24 ENCOUNTER — Encounter: Payer: Managed Care, Other (non HMO) | Attending: Family | Admitting: Skilled Nursing Facility1

## 2021-01-24 ENCOUNTER — Other Ambulatory Visit: Payer: Self-pay

## 2021-01-24 DIAGNOSIS — E118 Type 2 diabetes mellitus with unspecified complications: Secondary | ICD-10-CM | POA: Insufficient documentation

## 2021-01-24 DIAGNOSIS — E669 Obesity, unspecified: Secondary | ICD-10-CM | POA: Diagnosis present

## 2021-01-24 NOTE — Progress Notes (Signed)
Bariatric Nutrition Follow-Up Visit Medical Nutrition Therapy   NUTRITION ASSESSMENT  RYGB   Surgery: 06/29/2019  Anthropometrics  Start weight at [facility]: 245.2 lbs (date: 03/29/2019) Today's weight: 188 lbs  Body Composition Scale 11/24/2019 12/23/2019 12/12/2020 01/24/2021  Weight  lbs 200.1 196.8 191.9 188  Total Body Fat  % 37.3 36.8 36.1 35.3     Visceral Fat 10 10  9   Fat-Free Mass  % 62.6 63.1  64.6     Total Body Water  % 45.8 46 46.4 46.8     Muscle-Mass  lbs 33.3 33.2  33  BMI 31.1 30.5  29.1  Body Fat Displacement            Torso  lbs 46.3 44.8  41.1        Left Leg  lbs 9.2 8.9  8.2        Right Leg  lbs 9.2 8.9  8.2        Left Arm  lbs 4.6 4.4  4.1        Right Arm  lbs 4.6 4.4  4.1   Clinical  Medical hx: HTN, GERD, DM type 2, bipolar Medications: zoloft, abilify  Labs:    Lifestyle & Dietary Hx  Pt states it is hard to get meals in over the weekend due to working 2 jobs over the weekend.  Pt states she has been taking her multi and calcium but harder on Saturday and Sunday. Pt states she was not able to quit smoking.  Pt states she has not taken her burprion because it would help her stop smoking and she did NOT want to stop smoking because she enjoys it.  Pt states she started working with a therapist again.   Pt admits to purging with vomiting 2 times a week. In order to reduce overall harm to pt this Dietitian referred pt to an eating disorder dietitian and to email this dietitian once she has made that appt to ensure she is getting the care she needs.   Estimated daily fluid intake: 40.9 oz Estimated daily protein intake: 60+ g Medications: A1C 5.8, B12 811, vitamin 43.7 Supplements: bari fusion and no calcium (taken all at once in the morning) Current average weekly physical activity: walking 3 miles 2-3 days a week  24-Hr Dietary Recall First Meal 9:30-10am: avocado tomato on sandwich thin or boiled egg or skipped Snack: pork skins or almonds   Second Meal 2pm: skipped or chicken or salad: lettuce, tomato, cucumber, peppers, onions, [place] lunch meat, bacon bits, cheese, boiled egg + skinny girl dressing or fat free [place]  Snack: sugar free popcicle or chips Third Meal: shrimp alfredo with pasta or fast food Snack:  Beverages: coffee + zero sugar syrup 12 ounces, 3 16.9 ounces soda stream water, sugar free red bull, 2 drinks while bowling   Post-Op Goals/ Signs/ Symptoms Using straws: no Drinking while eating: no Chewing/swallowing difficulties: no Changes in vision: no Changes to mood/headaches: no Hair loss/changes to skin/nails: no Difficulty focusing/concentrating: no Sweating: no Dizziness/lightheadedness: no Palpitations: no  Carbonated/caffeinated beverages: no N/V/D/C/Gas: every other day bowel movement; vomiting intentionally 2 times a week Abdominal pain: no Dumping syndrome: no    NUTRITION DIAGNOSIS  Overweight/obesity ([place]-3.3) related to past poor dietary habits and physical inactivity as evidenced by completed bariatric surgery and following dietary guidelines for continued weight loss and healthy nutrition status.     NUTRITION INTERVENTION Nutrition counseling (C-1) and education (E-2) to facilitate bariatric surgery goals, including: The  importance of consuming adequate calories as well as certain nutrients daily due to the body's need for essential vitamins, minerals, and fats The importance of daily physical activity and to reach a goal of at least 150 minutes of moderate to vigorous physical activity weekly (or as directed by their physician) due to benefits such as increased musculature and improved lab values The importance of intuitive eating specifically learning hunger-satiety cues and understanding the importance of learning a new body Encouraged patient to honor their body's internal hunger and fullness cues.  Throughout the day, check in mentally and rate hunger. Stop eating when satisfied not  full regardless of how much food is left on the plate.  Get more if still hungry 20-30 minutes later.  The key is to honor satisfaction so throughout the meal, rate fullness factor and stop when comfortably satisfied not physically full. The key is to honor hunger and fullness without any feelings of guilt or shame.  Pay attention to what the internal cues are, rather than any external factors. This will enhance the confidence you have in listening to your own body and following those internal cues enabling you to increase how often you eat when you are hungry not out of appetite and stop when you are satisfied not full.  Encouraged pt to continue to eat balanced meals inclusive of non starchy vegetables 2 times a day 7 days a week Encouraged pt to choose lean protein sources: limiting beef, pork, sausage, hotdogs, and lunch meat Encourage pt to choose healthy fats such as plant based limiting animal fats Encouraged pt to continue to drink a minium 64 fluid ounces with half being plain water to satisfy proper hydration Advised pt to begin thinking of food as just food not good or bad  Goals: -make an appt with an eating disorder dietitian    Handouts Previously Provided Include  All maintainance phase handouts   Learning Style & Readiness for Change Teaching method utilized: Visual & Auditory  Demonstrated degree of understanding via: Teach Back  Barriers to learning/adherence to lifestyle change: inappropriate stress relief mechanisms/purging

## 2021-02-06 LAB — HM PAP SMEAR: HM Pap smear: NEGATIVE

## 2021-02-06 LAB — HM MAMMOGRAPHY

## 2021-02-08 ENCOUNTER — Other Ambulatory Visit: Payer: Self-pay | Admitting: Obstetrics and Gynecology

## 2021-02-08 DIAGNOSIS — R928 Other abnormal and inconclusive findings on diagnostic imaging of breast: Secondary | ICD-10-CM

## 2021-02-13 ENCOUNTER — Other Ambulatory Visit: Payer: Self-pay | Admitting: Family

## 2021-02-22 ENCOUNTER — Other Ambulatory Visit: Payer: Managed Care, Other (non HMO)

## 2021-03-03 ENCOUNTER — Other Ambulatory Visit: Payer: Self-pay

## 2021-03-03 ENCOUNTER — Ambulatory Visit
Admission: RE | Admit: 2021-03-03 | Discharge: 2021-03-03 | Disposition: A | Discharge status: Home / Self Care | Payer: Managed Care, Other (non HMO) | Source: Ambulatory Visit | Attending: Obstetrics and Gynecology | Admitting: Obstetrics and Gynecology

## 2021-03-03 DIAGNOSIS — R928 Other abnormal and inconclusive findings on diagnostic imaging of breast: Secondary | ICD-10-CM

## 2021-03-28 ENCOUNTER — Ambulatory Visit: Payer: Managed Care, Other (non HMO) | Admitting: Family

## 2021-04-13 ENCOUNTER — Telehealth: Payer: Self-pay

## 2021-04-13 ENCOUNTER — Other Ambulatory Visit: Payer: Self-pay

## 2021-04-13 DIAGNOSIS — D122 Benign neoplasm of ascending colon: Secondary | ICD-10-CM

## 2021-04-13 DIAGNOSIS — Z1211 Encounter for screening for malignant neoplasm of colon: Secondary | ICD-10-CM

## 2021-04-13 DIAGNOSIS — D123 Benign neoplasm of transverse colon: Secondary | ICD-10-CM

## 2021-04-13 DIAGNOSIS — D128 Benign neoplasm of rectum: Secondary | ICD-10-CM

## 2021-04-13 NOTE — Telephone Encounter (Signed)
Dr. Bryan Lemma is OK with his next available hospital date which is 12/14.  Patient has been scheduled for a pre-visit appt with the nurse on Wednesday, 06/21/21 at 8:30 am. Recall colonoscopy is scheduled at [facility] on Wednesday, 07/05/21 at 9:45 am. Patient will need to arrive at [facility] by 8:15 am with a care partner.  CASE ID: [id number]  Lm on vm for patient to return call.

## 2021-04-13 NOTE — Telephone Encounter (Signed)
Colonoscopy orders in epic. Discussing dates with Dr. Bryan Lemma.

## 2021-04-14 NOTE — Telephone Encounter (Signed)
Spoke with patient in regards to her appt information. Pt states that these dates will work for her. Pt had no concerns at the end of the call.

## 2021-05-09 ENCOUNTER — Ambulatory Visit: Payer: Managed Care, Other (non HMO) | Admitting: Family

## 2021-05-09 ENCOUNTER — Encounter: Payer: Self-pay | Admitting: Family

## 2021-05-09 ENCOUNTER — Telehealth: Payer: Self-pay | Admitting: Family

## 2021-05-09 ENCOUNTER — Other Ambulatory Visit: Payer: Self-pay

## 2021-05-09 VITALS — BP 127/80 | HR 55 | Temp 98.4°F | Ht 67.0 in | Wt 199.8 lb

## 2021-05-09 DIAGNOSIS — E785 Hyperlipidemia, unspecified: Secondary | ICD-10-CM | POA: Diagnosis not present

## 2021-05-09 DIAGNOSIS — Z720 Tobacco use: Secondary | ICD-10-CM

## 2021-05-09 DIAGNOSIS — F419 Anxiety disorder, unspecified: Secondary | ICD-10-CM

## 2021-05-09 DIAGNOSIS — Z23 Encounter for immunization: Secondary | ICD-10-CM | POA: Diagnosis not present

## 2021-05-09 DIAGNOSIS — I10 Essential (primary) hypertension: Secondary | ICD-10-CM

## 2021-05-09 DIAGNOSIS — F509 Eating disorder, unspecified: Secondary | ICD-10-CM | POA: Diagnosis not present

## 2021-05-09 DIAGNOSIS — E118 Type 2 diabetes mellitus with unspecified complications: Secondary | ICD-10-CM

## 2021-05-09 DIAGNOSIS — F32A Depression, unspecified: Secondary | ICD-10-CM

## 2021-05-09 NOTE — Assessment & Plan Note (Signed)
Fair control. Being managed by psychiatry.

## 2021-05-09 NOTE — Progress Notes (Signed)
Subjective:   By signing my name below, I, Lyric Barr-McArthur, attest that this documentation has been prepared under the direction and in the presence of Debbrah Alar, NP, 05/09/2021   Patient ID: Tanya Wilcox, female    DOB: Dec 25, 1970, 50 y.o.   MRN: [id number]  Chief Complaint  Patient presents with   Medical Management of Chronic Issues    6 m f/u     HPI Patient is in today for an office visit.  Blood pressure: Her blood pressure is within good range today. She is currently taking 50 mg metoprolol and 20 mg lisinopril to manage her blood pressure.  BP Readings from Last 3 Encounters:  05/09/21 127/80  12/23/20 126/88  11/09/20 (!) 150/84  Cholesterol: She is needing an updated reading of her cholesterol today. A1C: Her previous A1C was in good range. Lab Results  Component Value Date   HGBA1C 5.8 (H) 11/09/2020  Depression and anxiety: She has been seeing her psychiatrist but no longer sees her therapist. She notes that she has some bad days but results this to working two jobs. Eating disorder: She notes that her and her dietician believe she has an eating disorder. She is eating her meals and then throwing up her food. She has been experiencing this for about 1 month. She is struggling because she does not want to eat because she does not want to gain weight. She is worried about her body weight reaching over 200 lbs.  Tobacco: She smokes one pack of cigarettes a day.  Medications: She is compliant with taking her 500 mg valtrex, 50 mg metoprolol, and 20 mg lisinopril daily. Immunizations: She is interested in receiving her flu shot in the office today.   Health Maintenance Due  Topic Date Due   PAP SMEAR-Modifier  11/21/2019   COVID-19 Vaccine (3 - Booster for Pfizer series) 04/11/2020   MAMMOGRAM  04/29/2020   Zoster Vaccines- Shingrix (1 of 2) Never done    Past Medical History:  Diagnosis Date   Alcohol use    Anxiety    on meds   Bipolar disorder  ([place])    Depression    on meds   Diabetes ([place])    TYPE 2 ---NO MEDS -- diet controlled   Gestational diabetes    borderline II   Hyperlipidemia    on meds   Hypertension    on meds   Osteoarthritis    RIGHT knee   Tobacco abuse     Past Surgical History:  Procedure Laterality Date   CHOLECYSTECTOMY  2003   GASTRIC ROUX-EN-Y N/A 06/29/2019   Procedure: LAPAROSCOPIC ROUX-EN-Y GASTRIC BYPASS WITH UPPER ENDOSCOPY, ERAS PATHWAY;  Surgeon: Alphonsa Overall, MD;  Location: [facility];  Service: General;  Laterality: N/A;   WISDOM TOOTH EXTRACTION      Family History  Problem Relation Age of Onset   Hypertension Mother    Hyperlipidemia Mother    Colon polyps Mother 38   Alcohol abuse Father    Retinal detachment Daughter    Hyperlipidemia Sister    Hypertension Sister    Hypertension Brother    Diabetes Neg Hx    Colon cancer Neg Hx    Rectal cancer Neg Hx    Stomach cancer Neg Hx    Esophageal cancer Neg Hx     Social History   Socioeconomic History   Marital status: Single    Spouse name: Not on file   Number of children: Not on file  Years of education: Not on file   Highest education level: Not on file  Occupational History   Occupation: BILLING    Employer: [employer]  Tobacco Use   Smoking status: Every Day    Packs/day: 1.50    Types: Cigarettes   Smokeless tobacco: Never  Vaping Use   Vaping Use: Never used  Substance and Sexual Activity   Alcohol use: Yes    Alcohol/week: 2.0 standard drinks    Types: 2 Standard drinks or equivalent per week    Comment: occasionally   Drug use: No    Comment: some marijuana   Sexual activity: Not on file  Other Topics Concern   Not on file  Social History Narrative   Lives with daughter   Works at [employer] and [employer]   Enjoys sleeping   1 dog   Completed high school, some college   Social Determinants of Health   Financial Resource Strain: Not on file  Food Insecurity: Not on file  Transportation Needs:  Not on file  Physical Activity: Not on file  Stress: Not on file  Social Connections: Not on file  Intimate Partner Violence: Not on file    Outpatient Medications Prior to Visit  Medication Sig Dispense Refill   ALPRAZolam (XANAX) 0.5 MG tablet Take 0.5 mg by mouth 2 (two) times daily as needed for anxiety.   1   ARIPiprazole (ABILIFY) 5 MG tablet Take 5 mg by mouth daily.   1   Blood Glucose Monitoring Suppl (ONETOUCH VERIO) w/Device KIT USE TO TEST BLOOD SUGAR THREE TIMES DAILY 1 kit 0   Calcium Carbonate (CALCI-CHEW PO) Take 1 tablet by mouth 3 (three) times daily. Chew one calci-chew three times daily.     furosemide (LASIX) 20 MG tablet Take 1 tablet (20 mg total) by mouth daily as needed. 30 tablet 5   levonorgestrel (MIRENA) 20 MCG/24HR IUD 1 each by Intrauterine route once.     lisinopril (ZESTRIL) 20 MG tablet Take 0.5 tablets (10 mg total) by mouth daily. 90 tablet 3   metoprolol succinate (TOPROL-XL) 50 MG 24 hr tablet TAKE 1 TABLET(50 MG) BY MOUTH DAILY WITH OR IMMEDIATELY FOLLOWING A MEAL 90 tablet 0   Multiple Vitamins-Minerals (BARIATRIC MULTIVITAMINS/IRON PO) Take 2 tablets by mouth 2 (two) times daily.     ONETOUCH VERIO test strip 3 (three) times daily.     sertraline (ZOLOFT) 25 MG tablet Take 1 tablet (25 mg total) by mouth daily. 90 tablet 1   valACYclovir (VALTREX) 500 MG tablet Take 1 tablet (500 mg total) by mouth daily. 90 tablet 1   buPROPion (WELLBUTRIN SR) 150 MG 12 hr tablet Take 150 mg by mouth daily.     gabapentin (NEURONTIN) 100 MG capsule Take 1 capsule (100 mg total) by mouth 3 (three) times daily. (Patient not taking: Reported on 05/09/2021) 270 capsule 1   Facility-Administered Medications Prior to Visit  Medication Dose Route Frequency Provider Last Rate Last Admin   0.9 %  sodium chloride infusion  500 mL Intravenous Once Cirigliano, Vito V, DO        Allergies  Allergen Reactions   Desyrel [Trazodone]     Review of Systems  Constitutional:         (+) binging meals (+) purging meals   Psychiatric/Behavioral:  Positive for depression. The patient is nervous/anxious.       Objective:    Physical Exam Constitutional:      General: She is not in  acute distress.    Appearance: Normal appearance. She is not ill-appearing.  HENT:     Head: Normocephalic and atraumatic.     Right Ear: External ear normal.     Left Ear: External ear normal.  Eyes:     Extraocular Movements: Extraocular movements intact.     Pupils: Pupils are equal, round, and reactive to light.  Cardiovascular:     Rate and Rhythm: Normal rate and regular rhythm.     Heart sounds: Normal heart sounds. No murmur heard.   No gallop.  Pulmonary:     Effort: Pulmonary effort is normal. No respiratory distress.     Breath sounds: Normal breath sounds. No wheezing or rales.  Skin:    General: Skin is warm and dry.  Neurological:     Mental Status: She is alert and oriented to person, place, and time.  Psychiatric:        Behavior: Behavior normal.        Judgment: Judgment normal.    BP 127/80   Pulse (!) 55   Temp 98.4 F (36.9 C) (Oral)   Ht _0  (1.702 m)   Wt 199 lb 12.8 oz (90.6 kg)   SpO2 97%   BMI 31.29 kg/m  Wt Readings from Last 3 Encounters:  05/09/21 199 lb 12.8 oz (90.6 kg)  01/24/21 188 lb (85.3 kg)  12/23/20 188 lb (85.3 kg)       Assessment & Plan:   Problem List Items Addressed This Visit       Unprioritized   Tobacco abuse    Ongoing smoking 1PPD. Discussed smoking cessation.       Hyperlipidemia - Primary    Lab Results  Component Value Date   CHOL 144 12/09/2019   HDL 40.70 12/09/2019   LDLCALC 88 12/09/2019   TRIG 77.0 12/09/2019   CHOLHDL 4 12/09/2019  Not currently on statin. Will obtain follow up lipid panel.       Relevant Orders   Lipid panel   HTN (hypertension)    BP Readings from Last 3 Encounters:  05/09/21 127/80  12/23/20 126/88  11/09/20 (!) 150/84  BP at goal, continue lisinopril 47m and  toprol xl 556m        Eating disorder    Wt Readings from Last 3 Encounters:  05/09/21 199 lb 12.8 oz (90.6 kg)  01/24/21 188 lb (85.3 kg)  12/23/20 188 lb (85.3 kg)  She reports that she has been restricting her food, then binging purging. Afraid of getting back to 200lb.  We discussed prepping her food in advance and preparing 3 healthy meals with lean proteins/veggies and incorporating healthy snacks including fruits/veggies.  We also discussed establishing with a therapist who specializes in eating disorders.       Controlled type 2 diabetes mellitus with complication, without long-term current use of insulin (HCC)    Lab Results  Component Value Date   HGBA1C 5.8 (H) 11/09/2020   HGBA1C 5.7 (H) 08/05/2020   HGBA1C 5.9 12/09/2019   Lab Results  Component Value Date   MICROALBUR <0.7 12/11/2019   [place] 12/09/2019   CREATININE 0.90 11/09/2020  Clinically stable. Obtain follow up a1C.       Relevant Orders   Hemoglobin A1c   Comp Met (CMET)   Anxiety and depression    Fair control. Being managed by psychiatry.       Other Visit Diagnoses     Need for immunization against influenza  Relevant Orders   Flu Vaccine QUAD 82moIM (Fluarix, Fluzone & Alfiuria Quad PF) (Completed)      No orders of the defined types were placed in this encounter.   I, MDebbrah Alar NP, personally preformed the services described in this documentation.  All medical record entries made by the scribe were at my direction and in my presence.  I have reviewed the chart and discharge instructions (if applicable) and agree that the record reflects my personal performance and is accurate and complete. 05/09/2021  I,Lyric Barr-McArthur,acting as a sEducation administratorfor MNance Pear NP.,have documented all relevant documentation on the behalf of MNance Pear NP,as directed by  MNance Pear NP while in the presence of MNance Pear NP.  MNance Pear  NP

## 2021-05-09 NOTE — Assessment & Plan Note (Signed)
Wt Readings from Last 3 Encounters:  05/09/21 199 lb 12.8 oz (90.6 kg)  01/24/21 188 lb (85.3 kg)  12/23/20 188 lb (85.3 kg)   She reports that she has been restricting her food, then binging purging. Afraid of getting back to 200lb.  We discussed prepping her food in advance and preparing 3 healthy meals with lean proteins/veggies and incorporating healthy snacks including fruits/veggies.  We also discussed establishing with a therapist who specializes in eating disorders.

## 2021-05-09 NOTE — Patient Instructions (Signed)
Please call [place] to see about scheduling an appointment with a therapist who treats eating disorders. [phone]. Complete lab work prior to leaving.

## 2021-05-09 NOTE — Telephone Encounter (Signed)
Please call [phone] and request a copy of most recent pap and mammogram.

## 2021-05-09 NOTE — Assessment & Plan Note (Addendum)
Lab Results  Component Value Date   CHOL 144 12/09/2019   HDL 40.70 12/09/2019   LDLCALC 88 12/09/2019   TRIG 77.0 12/09/2019   CHOLHDL 4 12/09/2019   Not currently on statin. Will obtain follow up lipid panel.

## 2021-05-09 NOTE — Assessment & Plan Note (Addendum)
Lab Results  Component Value Date   HGBA1C 5.8 (H) 11/09/2020   HGBA1C 5.7 (H) 08/05/2020   HGBA1C 5.9 12/09/2019   Lab Results  Component Value Date   MICROALBUR <0.7 12/11/2019   LDLCALC 88 12/09/2019   CREATININE 0.90 11/09/2020   Clinically stable. Obtain follow up a1C.

## 2021-05-09 NOTE — Addendum Note (Signed)
Addended by: Kelle Darting A on: 05/09/2021 08:13 AM   Modules accepted: Orders

## 2021-05-09 NOTE — Assessment & Plan Note (Addendum)
BP Readings from Last 3 Encounters:  05/09/21 127/80  12/23/20 126/88  11/09/20 (!) 150/84   BP at goal, continue lisinopril 20mg  and toprol xl 50mg .

## 2021-05-09 NOTE — Telephone Encounter (Signed)
Request faxed to GYN

## 2021-05-09 NOTE — Assessment & Plan Note (Signed)
Ongoing smoking 1PPD. Discussed smoking cessation.

## 2021-05-12 ENCOUNTER — Other Ambulatory Visit: Payer: Managed Care, Other (non HMO)

## 2021-05-19 ENCOUNTER — Other Ambulatory Visit (INDEPENDENT_AMBULATORY_CARE_PROVIDER_SITE_OTHER): Payer: Managed Care, Other (non HMO)

## 2021-05-19 ENCOUNTER — Other Ambulatory Visit: Payer: Self-pay

## 2021-05-19 DIAGNOSIS — E785 Hyperlipidemia, unspecified: Secondary | ICD-10-CM

## 2021-05-19 DIAGNOSIS — E118 Type 2 diabetes mellitus with unspecified complications: Secondary | ICD-10-CM | POA: Diagnosis not present

## 2021-05-19 NOTE — Addendum Note (Signed)
Addended by: Kelle Darting A on: 05/19/2021 03:51 PM   Modules accepted: Orders

## 2021-05-20 LAB — LIPID PANEL
Cholesterol: 168 mg/dL (ref <200)
HDL: 55 mg/dL (ref >=50)
LDL Cholesterol (Calc): 94 mg/dL (calc)
Non-HDL Cholesterol (Calc): 113 mg/dL (calc) (ref <130)
Total CHOL/HDL Ratio: 3.1 (calc) (ref <5.0)
Triglycerides: 97 mg/dL (ref <150)

## 2021-05-20 LAB — COMPREHENSIVE METABOLIC PANEL
AG Ratio: 1.5 (calc) (ref 1.0–2.5)
ALT: 20 U/L (ref 6–29)
AST: 15 U/L (ref 10–35)
Albumin: 4.3 g/dL (ref 3.6–5.1)
Alkaline phosphatase (APISO): 73 U/L (ref 37–153)
BUN: 14 mg/dL (ref 7–25)
CO2: 27 mmol/L (ref 20–32)
Calcium: 9.3 mg/dL (ref 8.6–10.4)
Chloride: 105 mmol/L (ref 98–110)
Creat: 0.92 mg/dL (ref 0.50–1.03)
Globulin: 2.9 g/dL (calc) (ref 1.9–3.7)
Glucose, Bld: 80 mg/dL (ref 65–99)
Potassium: 4.6 mmol/L (ref 3.5–5.3)
Sodium: 140 mmol/L (ref 135–146)
Total Bilirubin: 0.2 mg/dL (ref 0.2–1.2)
Total Protein: 7.2 g/dL (ref 6.1–8.1)

## 2021-05-20 LAB — HEMOGLOBIN A1C
Hgb A1c MFr Bld: 5.7 % of total Hgb — ABNORMAL HIGH (ref <5.7)
Mean Plasma Glucose: 117 mg/dL
eAG (mmol/L): 6.5 mmol/L

## 2021-06-05 ENCOUNTER — Other Ambulatory Visit: Payer: Self-pay | Admitting: Family

## 2021-06-05 ENCOUNTER — Other Ambulatory Visit: Payer: Self-pay | Admitting: Endocrinology

## 2021-06-07 ENCOUNTER — Telehealth: Payer: Self-pay | Admitting: Gastroenterology

## 2021-06-07 NOTE — Telephone Encounter (Signed)
Hospital colonoscopy appt has been cancelled for 07/05/21 at 9:45 am. Pt will call back to reschedule.

## 2021-06-07 NOTE — Telephone Encounter (Signed)
This patient called to cancel hospital procedure scheduled for 12/14 at 9:45 at [facility].  She said she could not afford it at this time and will call back to reschedule at a later date.

## 2021-07-05 ENCOUNTER — Ambulatory Visit (HOSPITAL_COMMUNITY): Admit: 2021-07-05 | Payer: Managed Care, Other (non HMO) | Admitting: Gastroenterology

## 2021-07-05 ENCOUNTER — Encounter (HOSPITAL_COMMUNITY): Payer: Self-pay

## 2021-07-05 SURGERY — COLONOSCOPY WITH PROPOFOL
Anesthesia: Monitor Anesthesia Care

## 2021-08-09 ENCOUNTER — Ambulatory Visit: Payer: Managed Care, Other (non HMO) | Admitting: Family

## 2021-08-09 VITALS — BP 118/65 | HR 59 | Temp 97.9°F | Resp 18 | Ht 67.0 in | Wt 198.2 lb

## 2021-08-09 DIAGNOSIS — Z23 Encounter for immunization: Secondary | ICD-10-CM | POA: Diagnosis not present

## 2021-08-09 DIAGNOSIS — Z8639 Personal history of other endocrine, nutritional and metabolic disease: Secondary | ICD-10-CM | POA: Diagnosis not present

## 2021-08-09 DIAGNOSIS — I10 Essential (primary) hypertension: Secondary | ICD-10-CM

## 2021-08-09 DIAGNOSIS — Z7185 Encounter for immunization safety counseling: Secondary | ICD-10-CM | POA: Diagnosis not present

## 2021-08-09 DIAGNOSIS — A6000 Herpesviral infection of urogenital system, unspecified: Secondary | ICD-10-CM | POA: Diagnosis not present

## 2021-08-09 DIAGNOSIS — I7300 Raynaud's syndrome without gangrene: Secondary | ICD-10-CM

## 2021-08-09 MED ORDER — METOPROLOL SUCCINATE ER 50 MG PO TB24: ORAL_TABLET | ORAL | 0 refills | Status: DC

## 2021-08-09 NOTE — Assessment & Plan Note (Signed)
Discussed trying to keep hands warm. Hand out given to patient to review.

## 2021-08-09 NOTE — Assessment & Plan Note (Signed)
BP at goal. Continue current meds.

## 2021-08-09 NOTE — Progress Notes (Signed)
Subjective:     Patient ID: Tanya Wilcox, female    DOB: 05-Jan-1971, 51 y.o.   MRN: [id number]  Chief Complaint  Patient presents with   Follow-up   Numbness    Fingers both hands -- onset: gradual // when pt gets cold     HPI Patient is in today for follow up.  1) HTN- reports good compliance with BP meds.  Reports that she gets LE edema if she does not take lasix.   2) hyperglycemia-  Lab Results  Component Value Date   HGBA1C 5.7 (H) 05/19/2021   HGBA1C 5.8 (H) 11/09/2020   HGBA1C 5.7 (H) 08/05/2020   Lab Results  Component Value Date   MICROALBUR <0.7 12/11/2019   LDLCALC 94 05/19/2021   CREATININE 0.92 05/19/2021   3) Reports fingers get pale and cold- especially when she is at work (works at the Theme park manager at [employer]).   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   5) Anxiety/Depression/Bipolar- reports that symptoms are well controlled on current regimen which is being prescribed by psychiatry.   Health Maintenance Due  Topic Date Due   COVID-19 Vaccine (3 - Booster for [employer] series) 01/05/2020    Past Medical History:  Diagnosis Date   Alcohol use    Anxiety    on meds   Bipolar disorder ([place])    Depression    on meds   Diabetes ([place])    TYPE 2 ---NO MEDS -- diet controlled   Gestational diabetes    borderline II   Hyperlipidemia    on meds   Hypertension    on meds   Osteoarthritis    RIGHT knee   Tobacco abuse     Past Surgical History:  Procedure Laterality Date   CHOLECYSTECTOMY  2003   GASTRIC ROUX-EN-Y N/A 06/29/2019   Procedure: LAPAROSCOPIC ROUX-EN-Y GASTRIC BYPASS WITH UPPER ENDOSCOPY, ERAS PATHWAY;  Surgeon: Alphonsa Overall, MD;  Location: [facility];  Service: General;  Laterality: N/A;   WISDOM TOOTH EXTRACTION      Family History  Problem Relation Age of Onset   Hypertension Mother    Hyperlipidemia Mother    Colon polyps Mother 100   Alcohol abuse Father    Retinal detachment Daughter    Hyperlipidemia Sister     Hypertension Sister    Hypertension Brother    Diabetes Neg Hx    Colon cancer Neg Hx    Rectal cancer Neg Hx    Stomach cancer Neg Hx    Esophageal cancer Neg Hx     Social History   Socioeconomic History   Marital status: Single    Spouse name: Not on file   Number of children: Not on file   Years of education: Not on file   Highest education level: Not on file  Occupational History   Occupation: BILLING    Employer: [employer]  Tobacco Use   Smoking status: Every Day    Packs/day: 1.50    Types: Cigarettes   Smokeless tobacco: Never  Vaping Use   Vaping Use: Never used  Substance and Sexual Activity   Alcohol use: Yes    Alcohol/week: 2.0 standard drinks    Types: 2 Standard drinks or equivalent per week    Comment: occasionally   Drug use: No    Comment: some marijuana   Sexual activity: Not on file  Other Topics Concern   Not on file  Social History Narrative   Lives with  daughter   Works at [employer] and [employer]   Enjoys sleeping   1 dog   Completed high school, some college   Social Determinants of Radio broadcast assistant Strain: Not on file  Food Insecurity: Not on file  Transportation Needs: Not on file  Physical Activity: Not on file  Stress: Not on file  Social Connections: Not on file  Intimate Partner Violence: Not on file    Outpatient Medications Prior to Visit  Medication Sig Dispense Refill   ALPRAZolam (XANAX) 0.5 MG tablet Take 0.5 mg by mouth 2 (two) times daily as needed for anxiety.   1   ARIPiprazole (ABILIFY) 5 MG tablet Take 5 mg by mouth daily.   1   Blood Glucose Monitoring Suppl (ONETOUCH VERIO) w/Device KIT USE TO TEST BLOOD SUGAR THREE TIMES DAILY 1 kit 0   Calcium Carbonate (CALCI-CHEW PO) Take 1 tablet by mouth 3 (three) times daily. Chew one calci-chew three times daily.     furosemide (LASIX) 20 MG tablet Take 1 tablet (20 mg total) by mouth daily as needed. 30 tablet 5   levonorgestrel (MIRENA) 20 MCG/24HR IUD  1 each by Intrauterine route once.     lisinopril (ZESTRIL) 10 MG tablet TAKE 1 TABLET(10 MG) BY MOUTH DAILY 90 tablet 1   Multiple Vitamins-Minerals (BARIATRIC MULTIVITAMINS/IRON PO) Take 2 tablets by mouth 2 (two) times daily.     ONETOUCH VERIO test strip CHECK BLOOD SUGAR THREE TIMES DAILY 200 strip 1   sertraline (ZOLOFT) 25 MG tablet Take 1 tablet (25 mg total) by mouth daily. 90 tablet 1   valACYclovir (VALTREX) 500 MG tablet Take 1 tablet (500 mg total) by mouth daily. 90 tablet 1   gabapentin (NEURONTIN) 100 MG capsule Take 1 capsule (100 mg total) by mouth 3 (three) times daily. 270 capsule 1   lisinopril (ZESTRIL) 20 MG tablet Take 0.5 tablets (10 mg total) by mouth daily. 90 tablet 3   metoprolol succinate (TOPROL-XL) 50 MG 24 hr tablet TAKE 1 TABLET(50 MG) BY MOUTH DAILY WITH OR IMMEDIATELY FOLLOWING A MEAL 90 tablet 0   Facility-Administered Medications Prior to Visit  Medication Dose Route Frequency Provider Last Rate Last Admin   0.9 %  sodium chloride infusion  500 mL Intravenous Once Cirigliano, Vito V, DO        Allergies  Allergen Reactions   Desyrel [Trazodone]     ROS    See HPI Objective:    Physical Exam Constitutional:      General: She is not in acute distress.    Appearance: Normal appearance. She is well-developed.  HENT:     Head: Normocephalic and atraumatic.     Right Ear: External ear normal.     Left Ear: External ear normal.  Eyes:     General: No scleral icterus. Neck:     Thyroid: No thyromegaly.  Cardiovascular:     Rate and Rhythm: Normal rate and regular rhythm.     Heart sounds: Normal heart sounds. No murmur heard. Pulmonary:     Effort: Pulmonary effort is normal. No respiratory distress.     Breath sounds: Normal breath sounds. No wheezing.  Musculoskeletal:     Cervical back: Neck supple.  Skin:    General: Skin is warm and dry.  Neurological:     Mental Status: She is alert and oriented to person, place, and time.   Psychiatric:        Mood and Affect: Mood normal.  Behavior: Behavior normal.        Thought Content: Thought content normal.        Judgment: Judgment normal.    BP 118/65    Pulse (!) 59    Temp 97.9 F (36.6 C)    Resp 18    Ht _0  (1.702 m)    Wt 198 lb 3.2 oz (89.9 kg)    SpO2 98%    BMI 31.04 kg/m  Wt Readings from Last 3 Encounters:  08/09/21 198 lb 3.2 oz (89.9 kg)  05/09/21 199 lb 12.8 oz (90.6 kg)  01/24/21 188 lb (85.3 kg)       Assessment & Plan:   Problem List Items Addressed This Visit       Unprioritized   Raynaud's disease without gangrene    Discussed trying to keep hands warm. Hand out given to patient to review.       Relevant Medications   metoprolol succinate (TOPROL-XL) 50 MG 24 hr tablet   Immunization counseling    Recommended that she get the bivalent covid booster.  Shingrix #1 today.       HTN (hypertension)    BP at goal. Continue current meds.       Relevant Medications   metoprolol succinate (TOPROL-XL) 50 MG 24 hr tablet   History of non-insulin dependent type 2 diabetes mellitus    With weight loss her A1C has nearly returned to the normal range.  Will continue to monitor.       Genital HSV    Stable on daily valtrex. Continue same.       Other Visit Diagnoses     Need for shingles vaccine    -  Primary   Relevant Orders   Varicella-zoster vaccine IM (Shingrix) (Completed)       I have discontinued Amado Nash. Pusey's gabapentin. I am also having her maintain her levonorgestrel, ALPRAZolam, ARIPiprazole, OneTouch Verio, Multiple Vitamins-Minerals (BARIATRIC MULTIVITAMINS/IRON PO), Calcium Carbonate (CALCI-CHEW PO), furosemide, sertraline, valACYclovir, lisinopril, OneTouch Verio, and metoprolol succinate. We will continue to administer sodium chloride.  Meds ordered this encounter  Medications   metoprolol succinate (TOPROL-XL) 50 MG 24 hr tablet    Sig: TAKE 1 TABLET(50 MG) BY MOUTH DAILY WITH OR IMMEDIATELY  FOLLOWING A MEAL    Dispense:  90 tablet    Refill:  0    Order Specific Question:   Supervising Provider    Answer:   Penni Homans A [[id number]]

## 2021-08-09 NOTE — Assessment & Plan Note (Signed)
With weight loss her A1C has nearly returned to the normal range.  Will continue to monitor.

## 2021-08-09 NOTE — Assessment & Plan Note (Signed)
Stable on daily valtrex.  Continue same.  

## 2021-08-09 NOTE — Assessment & Plan Note (Signed)
Recommended that she get the bivalent covid booster.  Shingrix #1 today.

## 2021-09-14 ENCOUNTER — Other Ambulatory Visit: Payer: Self-pay | Admitting: Family

## 2021-10-31 LAB — HM DIABETES EYE EXAM

## 2021-11-07 ENCOUNTER — Ambulatory Visit: Payer: Managed Care, Other (non HMO) | Admitting: Family

## 2021-11-16 ENCOUNTER — Other Ambulatory Visit: Payer: Self-pay | Admitting: Family

## 2021-11-17 ENCOUNTER — Ambulatory Visit: Payer: Managed Care, Other (non HMO) | Admitting: Family

## 2021-11-17 VITALS — BP 128/58 | HR 58 | Temp 98.4°F | Resp 16 | Ht 67.0 in | Wt 203.0 lb

## 2021-11-17 DIAGNOSIS — M5412 Radiculopathy, cervical region: Secondary | ICD-10-CM | POA: Insufficient documentation

## 2021-11-17 MED ORDER — METHYLPREDNISOLONE 4 MG PO TBPK: ORAL_TABLET | ORAL | 0 refills | Status: DC

## 2021-11-17 NOTE — Progress Notes (Signed)
? ?Subjective:  ? ? ? Patient ID: Tanya Wilcox, female    DOB: 1971/01/05, 51 y.o.   MRN: [id number] ? ?Chief Complaint  ?Patient presents with  ? Neck Pain  ?  Complains of neck pain going down left arms. This started about 2 weeks ago  ? ? ?Neck Pain  ? ?Patient is in today with chief complaint of neck pain. Reports that pain radiates down her left arm. Pain began 2 weeks ago. She has been using tylenol without improvement.   ? ?Health Maintenance Due  ?Topic Date Due  ? COVID-19 Vaccine (3 - Booster for Pfizer series) 01/05/2020  ? Zoster Vaccines- Shingrix (2 of 2) 10/04/2021  ? OPHTHALMOLOGY EXAM  10/11/2021  ? HEMOGLOBIN A1C  11/17/2021  ? ? ?Past Medical History:  ?Diagnosis Date  ? Alcohol use   ? Anxiety   ? on meds  ? Bipolar disorder ([place])   ? Depression   ? on meds  ? Diabetes ([place])   ? TYPE 2 ---NO MEDS -- diet controlled  ? Gestational diabetes   ? borderline II  ? Hyperlipidemia   ? on meds  ? Hypertension   ? on meds  ? Osteoarthritis   ? RIGHT knee  ? Tobacco abuse   ? ? ?Past Surgical History:  ?Procedure Laterality Date  ? CHOLECYSTECTOMY  2003  ? GASTRIC ROUX-EN-Y N/A 06/29/2019  ? Procedure: LAPAROSCOPIC ROUX-EN-Y GASTRIC BYPASS WITH UPPER ENDOSCOPY, ERAS PATHWAY;  Surgeon: Alphonsa Overall, MD;  Location: [facility];  Service: General;  Laterality: N/A;  ? WISDOM TOOTH EXTRACTION    ? ? ?Family History  ?Problem Relation Age of Onset  ? Hypertension Mother   ? Hyperlipidemia Mother   ? Colon polyps Mother 66  ? Alcohol abuse Father   ? Retinal detachment Daughter   ? Hyperlipidemia Sister   ? Hypertension Sister   ? Hypertension Brother   ? Diabetes Neg Hx   ? Colon cancer Neg Hx   ? Rectal cancer Neg Hx   ? Stomach cancer Neg Hx   ? Esophageal cancer Neg Hx   ? ? ?Social History  ? ?Socioeconomic History  ? Marital status: Single  ?  Spouse name: Not on file  ? Number of children: Not on file  ? Years of education: Not on file  ? Highest education level: Not on file  ?Occupational History  ?  Occupation: BILLING  ?  Employer: [employer]  ?Tobacco Use  ? Smoking status: Every Day  ?  Packs/day: 1.50  ?  Types: Cigarettes  ? Smokeless tobacco: Never  ?Vaping Use  ? Vaping Use: Never used  ?Substance and Sexual Activity  ? Alcohol use: Yes  ?  Alcohol/week: 2.0 standard drinks  ?  Types: 2 Standard drinks or equivalent per week  ?  Comment: occasionally  ? Drug use: No  ?  Comment: some marijuana  ? Sexual activity: Not on file  ?Other Topics Concern  ? Not on file  ?Social History Narrative  ? Lives with daughter  ? Works at [employer] and [employer]  ? Enjoys sleeping  ? 1 dog  ? Completed high school, some college  ? ?Social Determinants of Health  ? ?Financial Resource Strain: Not on file  ?Food Insecurity: Not on file  ?Transportation Needs: Not on file  ?Physical Activity: Not on file  ?Stress: Not on file  ?Social Connections: Not on file  ?Intimate Partner Violence: Not on file  ? ? ?Outpatient  Medications Prior to Visit  ?Medication Sig Dispense Refill  ? ALPRAZolam (XANAX) 0.5 MG tablet Take 0.5 mg by mouth 2 (two) times daily as needed for anxiety.   1  ? ARIPiprazole (ABILIFY) 5 MG tablet Take 5 mg by mouth daily.   1  ? Blood Glucose Monitoring Suppl (ONETOUCH VERIO) w/Device KIT USE TO TEST BLOOD SUGAR THREE TIMES DAILY 1 kit 0  ? furosemide (LASIX) 20 MG tablet Take 1 tablet (20 mg total) by mouth daily as needed. 30 tablet 5  ? levonorgestrel (MIRENA) 20 MCG/24HR IUD 1 each by Intrauterine route once.    ? lisinopril (ZESTRIL) 10 MG tablet TAKE 1 TABLET(10 MG) BY MOUTH DAILY 90 tablet 1  ? metoprolol succinate (TOPROL-XL) 50 MG 24 hr tablet TAKE 1 TABLET(50 MG) BY MOUTH DAILY WITH OR IMMEDIATELY FOLLOWING A MEAL 90 tablet 0  ? Multiple Vitamins-Minerals (BARIATRIC MULTIVITAMINS/IRON PO) Take 2 tablets by mouth 2 (two) times daily.    ? ONETOUCH VERIO test strip CHECK BLOOD SUGAR THREE TIMES DAILY 200 strip 1  ? sertraline (ZOLOFT) 25 MG tablet Take 1 tablet (25 mg total) by mouth daily. 90  tablet 1  ? valACYclovir (VALTREX) 500 MG tablet TAKE 1 TABLET(500 MG) BY MOUTH DAILY 90 tablet 1  ? Calcium Carbonate (CALCI-CHEW PO) Take 1 tablet by mouth 3 (three) times daily. Chew one calci-chew three times daily.    ? ?Facility-Administered Medications Prior to Visit  ?Medication Dose Route Frequency Provider Last Rate Last Admin  ? 0.9 %  sodium chloride infusion  500 mL Intravenous Once Cirigliano, Vito V, DO      ? ? ?Allergies  ?Allergen Reactions  ? Desyrel [Trazodone]   ? ? ?Review of Systems  ?Musculoskeletal:  Positive for neck pain.  ? ?   ?Objective:  ?  ?Physical Exam ?Constitutional:   ?   General: She is not in acute distress. ?   Appearance: Normal appearance. She is well-developed.  ?HENT:  ?   Head: Normocephalic and atraumatic.  ?   Right Ear: External ear normal.  ?   Left Ear: External ear normal.  ?Eyes:  ?   General: No scleral icterus. ?Neck:  ?   Thyroid: No thyromegaly.  ?Cardiovascular:  ?   Rate and Rhythm: Normal rate and regular rhythm.  ?   Heart sounds: Normal heart sounds. No murmur heard. ?Pulmonary:  ?   Effort: Pulmonary effort is normal. No respiratory distress.  ?   Breath sounds: Normal breath sounds. No wheezing.  ?Musculoskeletal:  ?   Cervical back: Neck supple.  ?Skin: ?   General: Skin is warm and dry.  ?Neurological:  ?   Mental Status: She is alert and oriented to person, place, and time.  ?   Comments: Bilateral UE/LE strength is 5/5  ?Psychiatric:     ?   Mood and Affect: Mood normal.     ?   Behavior: Behavior normal.     ?   Thought Content: Thought content normal.     ?   Judgment: Judgment normal.  ? ? ?BP (!) 128/58 (BP Location: Right Arm, Patient Position: Sitting, Cuff Size: Large)   Pulse (!) 58   Temp 98.4 ?F (36.9 ?C) (Oral)   Resp 16   Ht _0  (1.702 m)   Wt 203 lb (92.1 kg)   SpO2 100%   BMI 31.79 kg/m?  ?Wt Readings from Last 3 Encounters:  ?11/17/21 203 lb (92.1 kg)  ?08/09/21 198  lb 3.2 oz (89.9 kg)  ?05/09/21 199 lb 12.8 oz (90.6 kg)   ? ? ?   ?Assessment & Plan:  ? ?Problem List Items Addressed This Visit   ? ?  ? Unprioritized  ? Cervical radiculopathy - Primary  ?  New. Will rx with medrol dose pak. She may continue tylenol as needed for pain. Pt is advised to call if symptoms worsen or if symptoms fail to improve.  ? ?  ?  ? ? ?I have discontinued Amado Nash. Hyer's Calcium Carbonate (CALCI-CHEW PO). I am also having her start on methylPREDNISolone. Additionally, I am having her maintain her levonorgestrel, ALPRAZolam, ARIPiprazole, OneTouch Verio, Multiple Vitamins-Minerals (BARIATRIC MULTIVITAMINS/IRON PO), furosemide, sertraline, lisinopril, OneTouch Verio, valACYclovir, and metoprolol succinate. We will continue to administer sodium chloride. ? ?Meds ordered this encounter  ?Medications  ? methylPREDNISolone (MEDROL DOSEPAK) 4 MG TBPK tablet  ?  Sig: Take per package instructions  ?  Dispense:  21 tablet  ?  Refill:  0  ?  Order Specific Question:   Supervising Provider  ?  Answer:   Penni Homans A [[id number]]  ? ?

## 2021-11-17 NOTE — Assessment & Plan Note (Signed)
New. Will rx with medrol dose pak. She may continue tylenol as needed for pain. Pt is advised to call if symptoms worsen or if symptoms fail to improve.  ?

## 2021-11-17 NOTE — Patient Instructions (Signed)
Please begin medrol dose pak. ?Call if symptoms worsen or if symptoms fail to improve.  ?

## 2021-11-20 IMAGING — MG MM DIGITAL DIAGNOSTIC UNILAT*R* W/ TOMO W/ CAD
6 series · 6 of 18 positions shown · non-contrast
Comparison: Previous exam(s).

CLINICAL DATA: Screening recall for a right breast asymmetry.

EXAM:
DIGITAL DIAGNOSTIC UNILATERAL RIGHT MAMMOGRAM WITH TOMOSYNTHESIS AND
CAD; ULTRASOUND RIGHT BREAST LIMITED
TECHNIQUE: Right digital diagnostic mammography and breast tomosynthesis was
performed. The images were evaluated with computer-aided detection.;
Targeted ultrasound examination of the right breast was performed

[R CC synth-2D (1 of 2)]
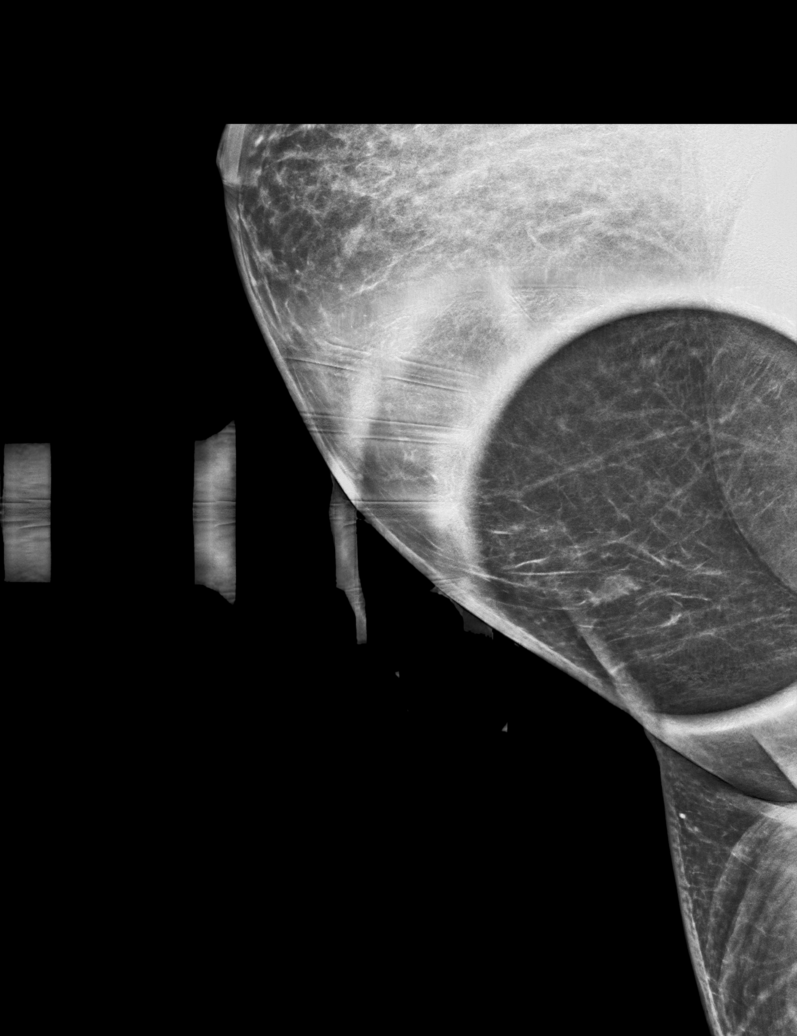

[R CC synth-2D (2 of 2)]
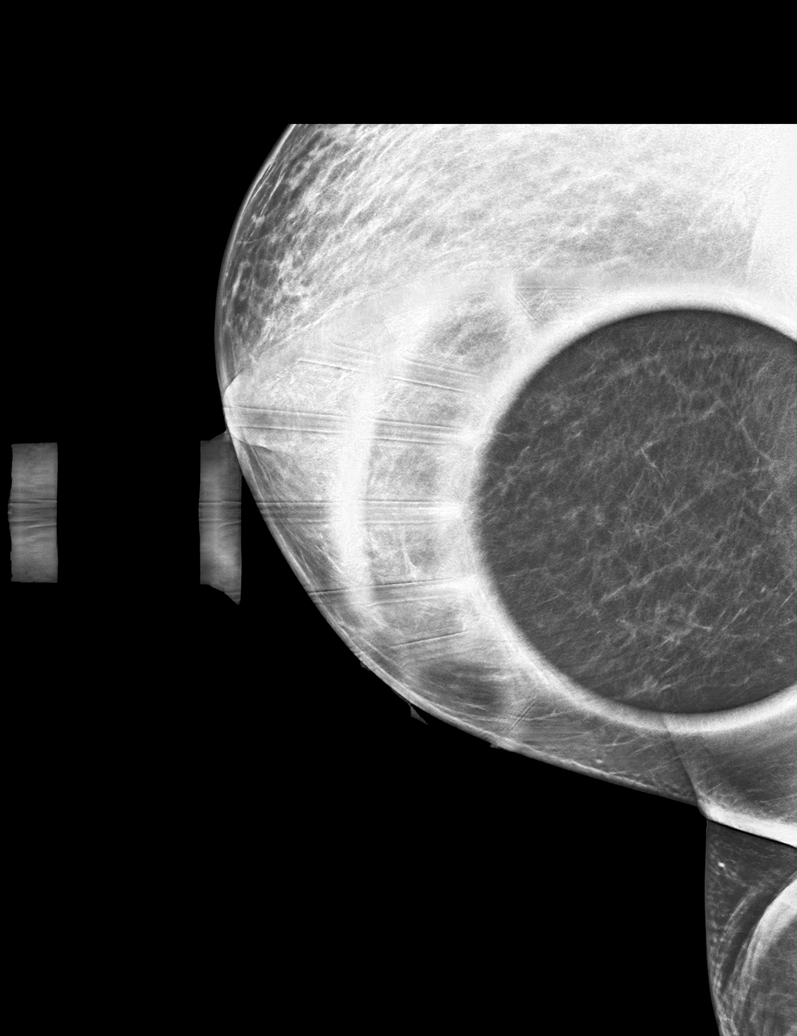

[R LM synth-2D]
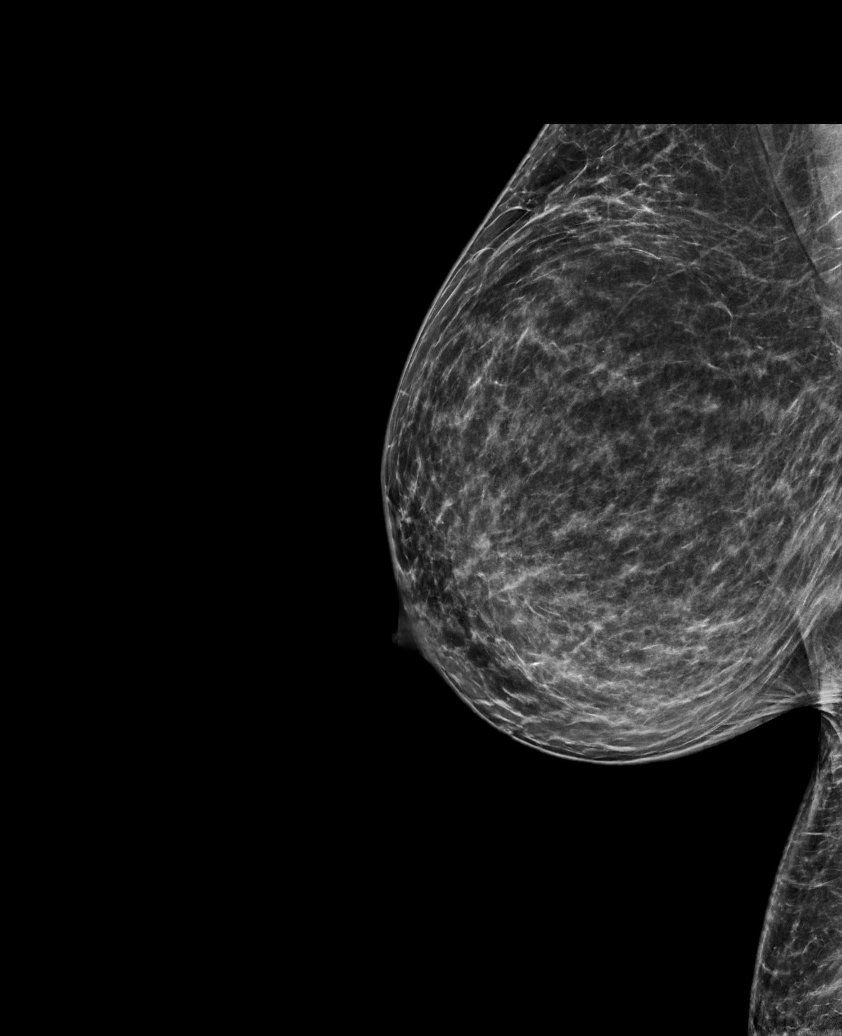

[R CC tomo (1 of 2) · tomo slice 30/59.0]
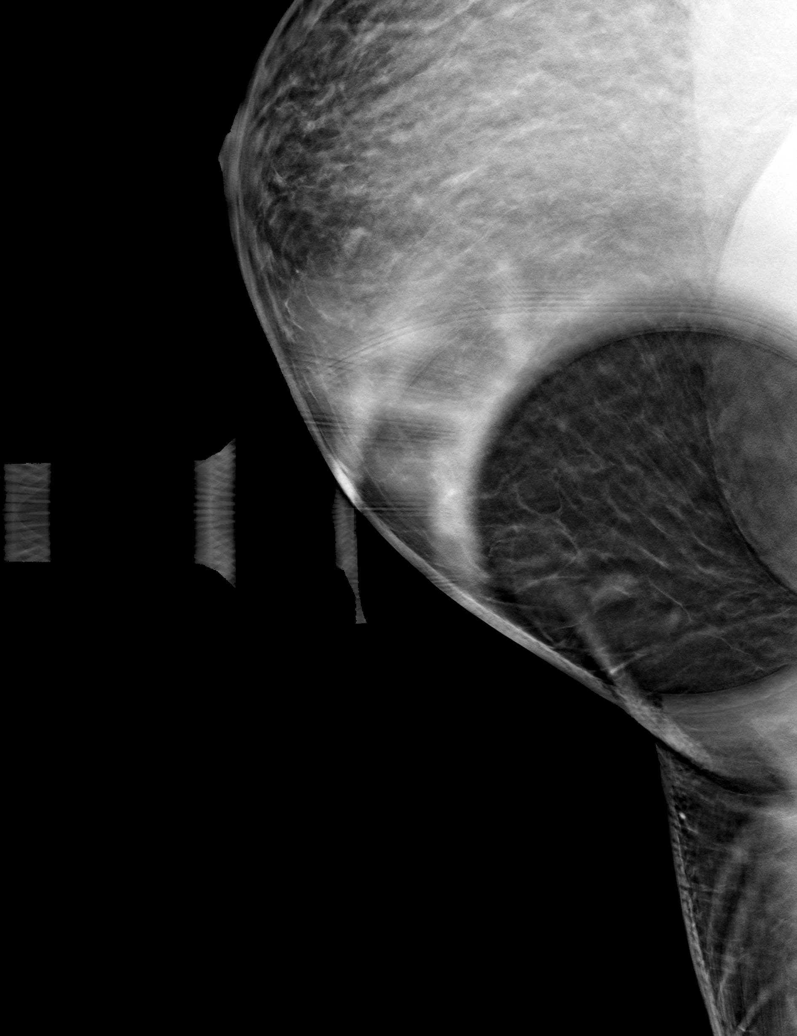

[R LM tomo · tomo slice 40/79.0]
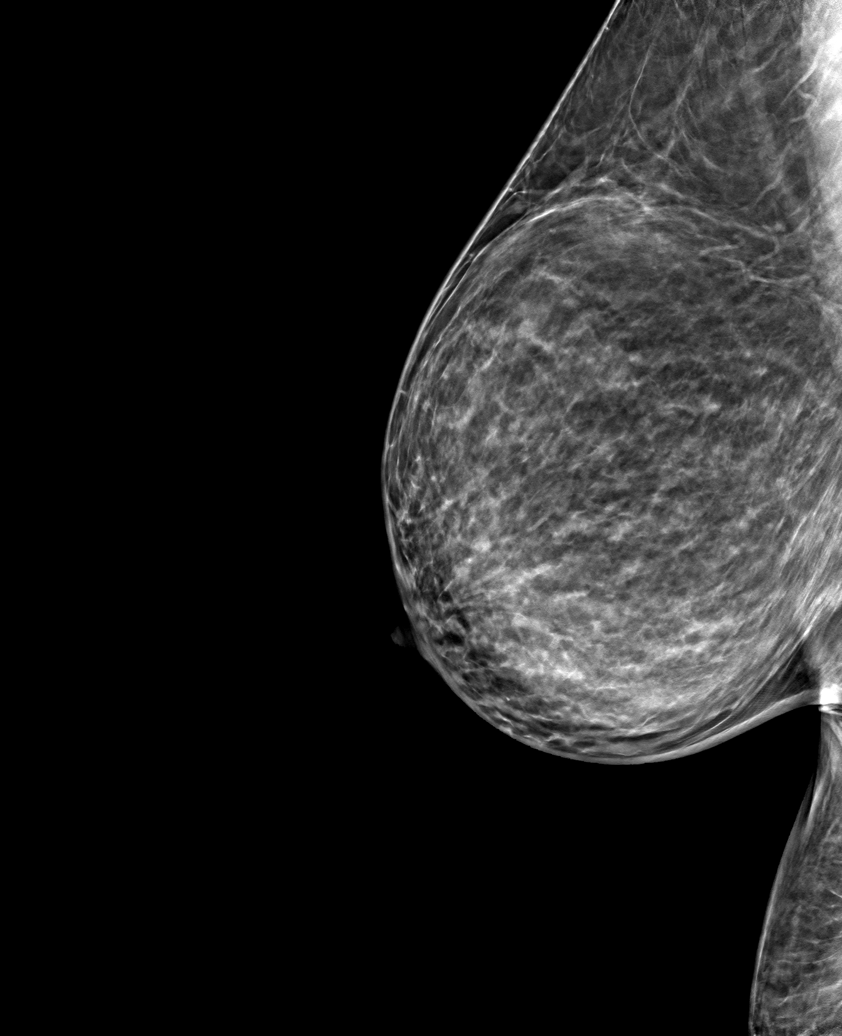

[R CC tomo (2 of 2) · tomo slice 29/56.0]
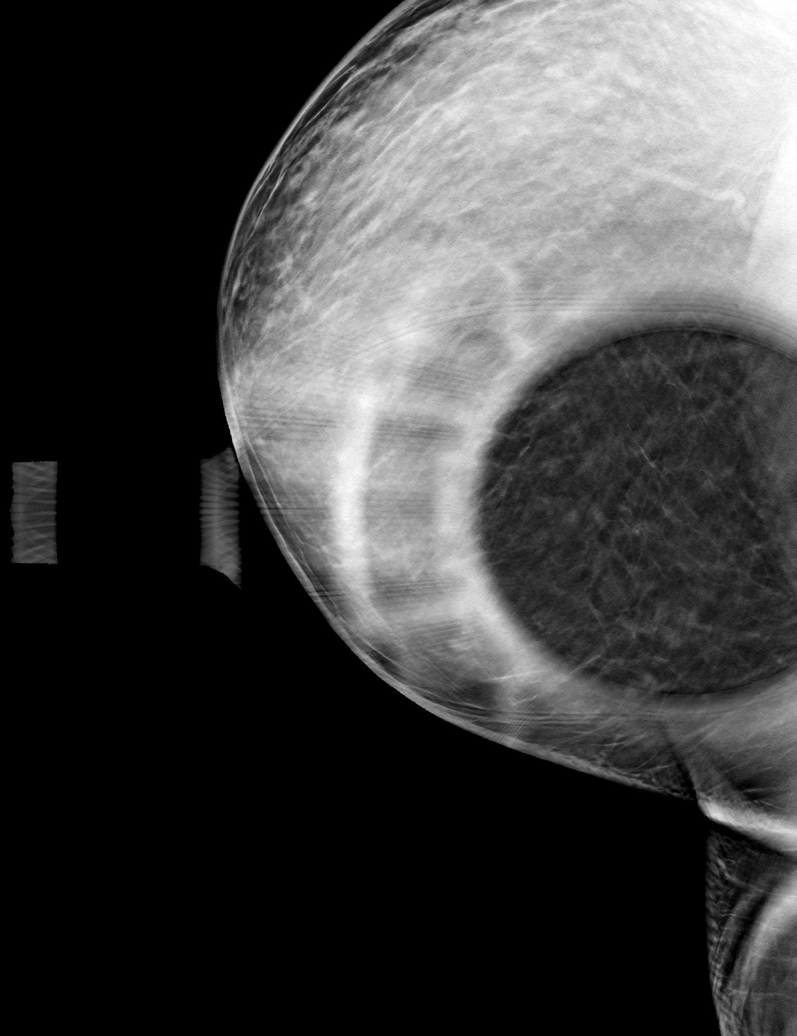

[6 of 18 positions shown; findings below may reference images not displayed]

ACR Breast Density Category b: There are scattered areas of
fibroglandular density.
FINDINGS: Spot compression tomosynthesis images through medial breast
demonstrates a persistent superficial subtle asymmetry near the
cleavage line. No correlate is seen on the true lateral view or the
screening MLO view.

Ultrasound targeted to the right breast at 4 o'clock, 7 cm from the
nipple demonstrates a hypoechoic oval mass within the skin measuring
6 x 3 x 4 mm.
IMPRESSION: The mass in the medial right breast corresponds with a benign skin
lesion, likely a sebaceous cyst.

RECOMMENDATION:
Screening mammogram in one year.(Code:LS-J-O0A)

I have discussed the findings and recommendations with the patient.
If applicable, a reminder letter will be sent to the patient
regarding the next appointment.

BI-RADS CATEGORY  2: Benign.

## 2021-11-20 IMAGING — US US BREAST*R* LIMITED INC AXILLA
1 series · 13 of 13 positions shown · non-contrast
Comparison: Previous exam(s).

CLINICAL DATA: Screening recall for a right breast asymmetry.

EXAM:
DIGITAL DIAGNOSTIC UNILATERAL RIGHT MAMMOGRAM WITH TOMOSYNTHESIS AND
CAD; ULTRASOUND RIGHT BREAST LIMITED
TECHNIQUE: Right digital diagnostic mammography and breast tomosynthesis was
performed. The images were evaluated with computer-aided detection.;
Targeted ultrasound examination of the right breast was performed

[Series 1: us breast*right* limited inc axilla · 0.05mm/px · 13 of 13 slices shown]
[im 1/13]
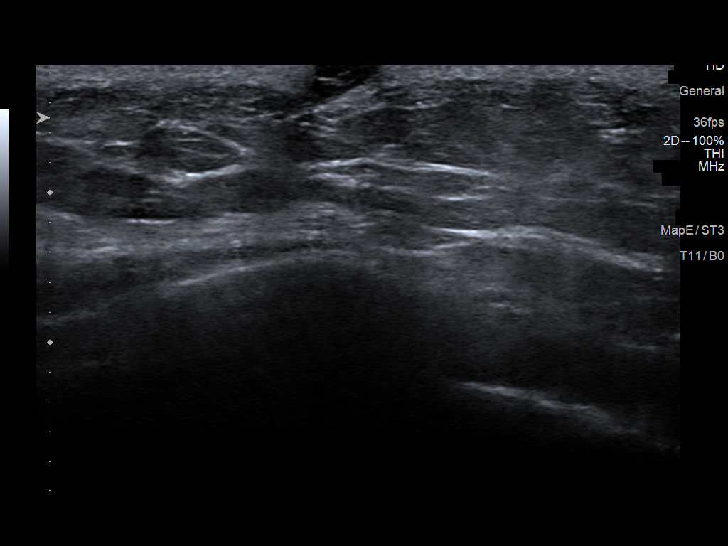
[im 2/13]
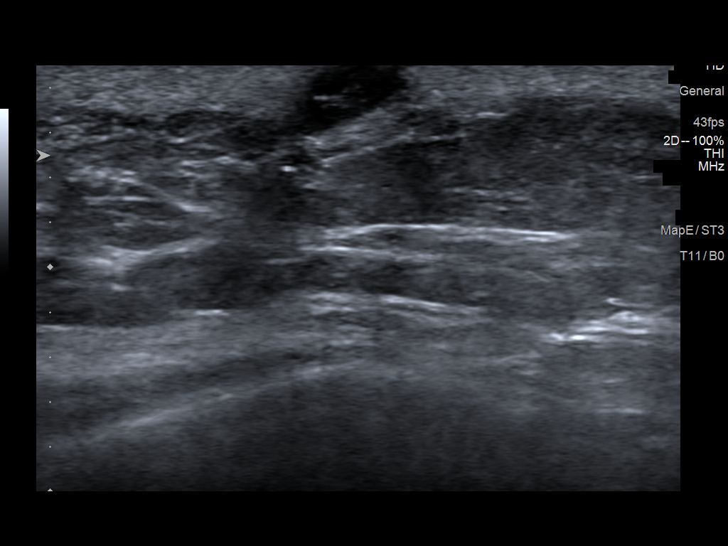
[im 3/13]
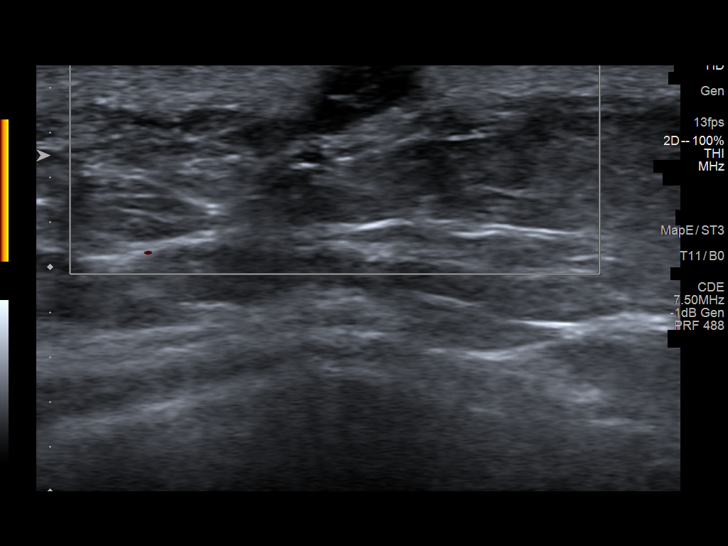
[im 4/13]
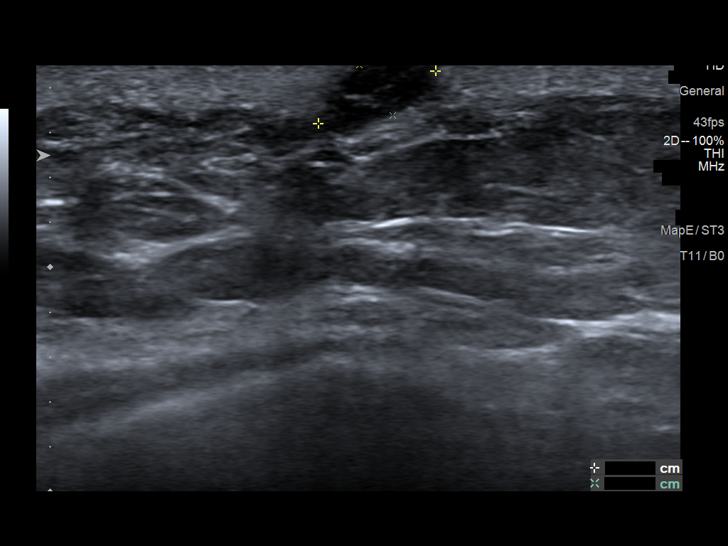
[im 5/13]
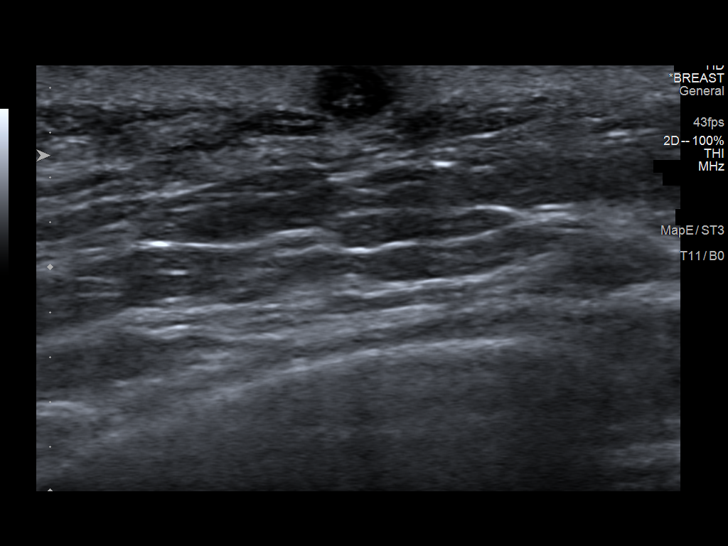
[im 6/13]
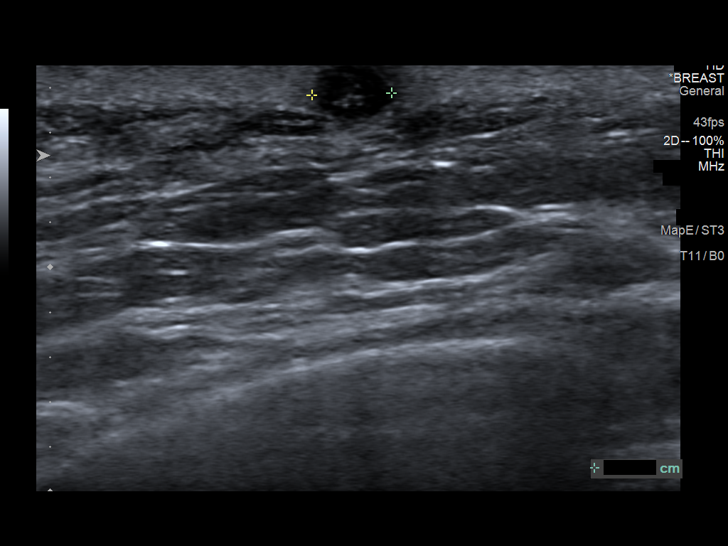
[im 7/13]
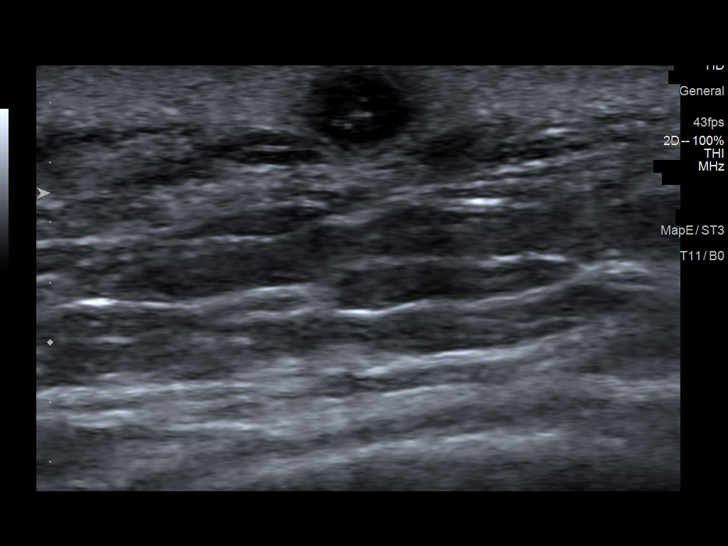
[im 8/13]
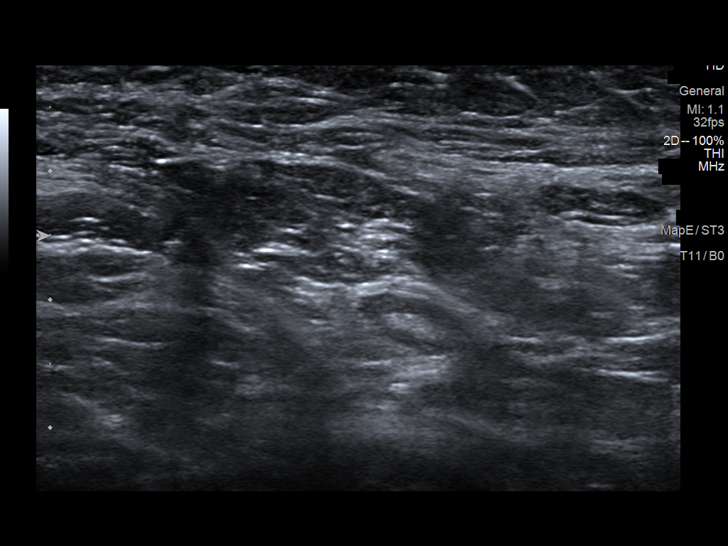
[im 9/13]
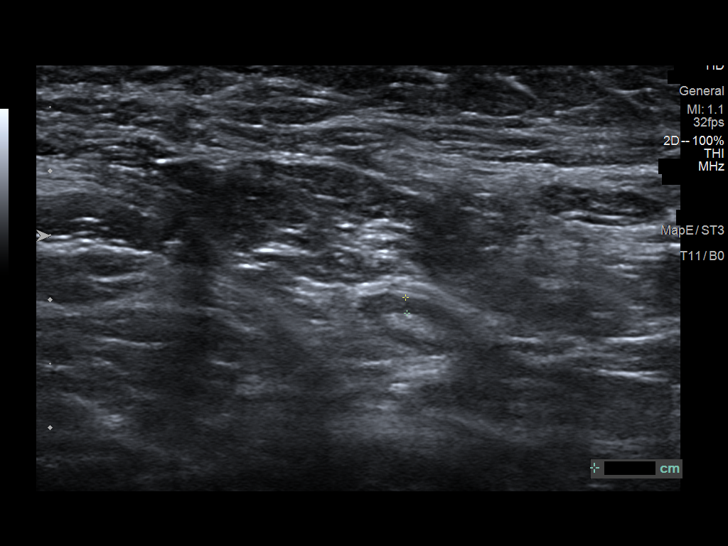
[im 10/13]
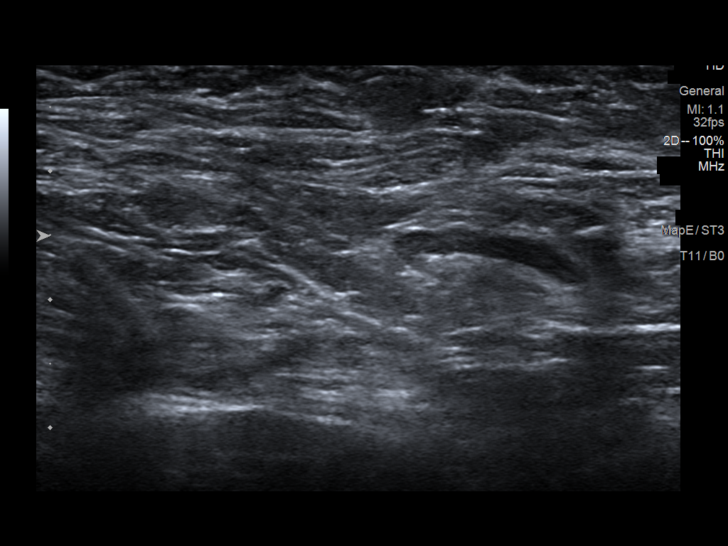
[im 11/13]
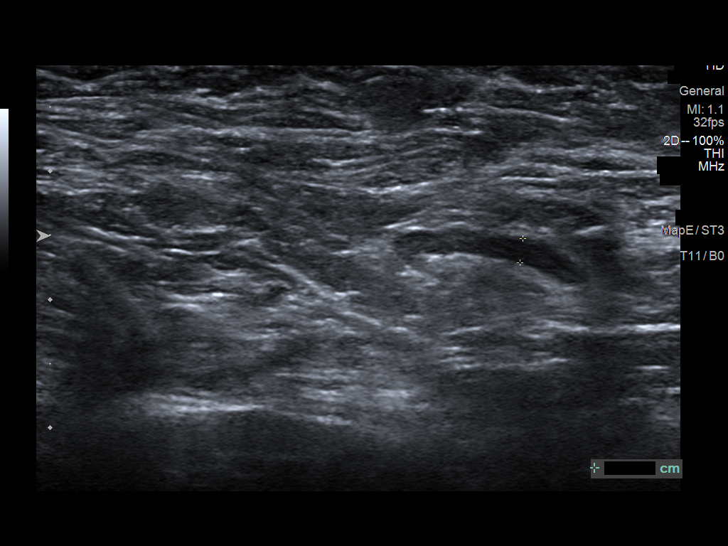
[im 12/13]
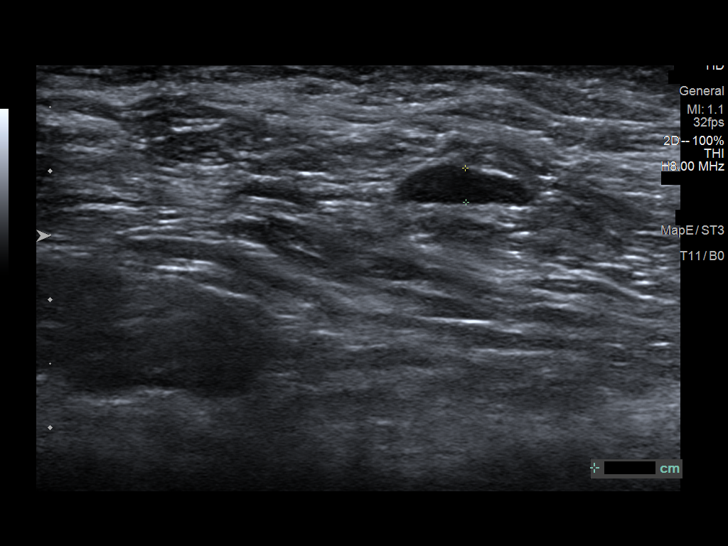
[im 13/13]
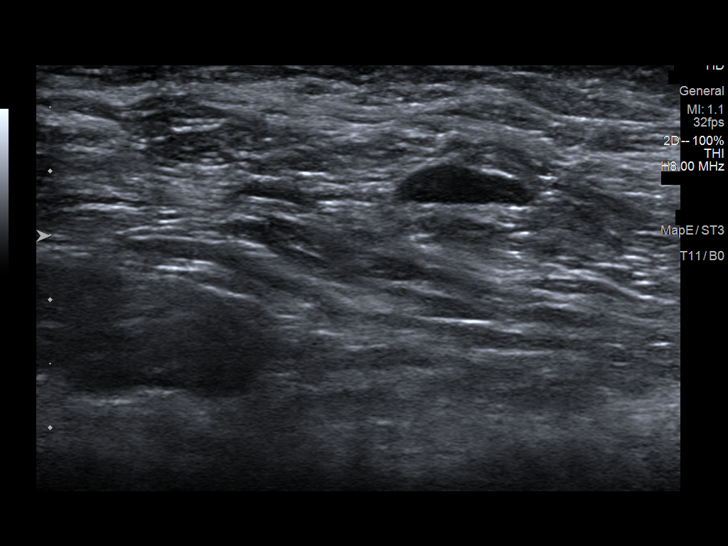

[13 of 13 positions shown; findings below may reference images not displayed]

ACR Breast Density Category b: There are scattered areas of
fibroglandular density.
FINDINGS: Spot compression tomosynthesis images through medial breast
demonstrates a persistent superficial subtle asymmetry near the
cleavage line. No correlate is seen on the true lateral view or the
screening MLO view.

Ultrasound targeted to the right breast at 4 o'clock, 7 cm from the
nipple demonstrates a hypoechoic oval mass within the skin measuring
6 x 3 x 4 mm.
IMPRESSION: The mass in the medial right breast corresponds with a benign skin
lesion, likely a sebaceous cyst.

RECOMMENDATION:
Screening mammogram in one year.(Code:LS-J-O0A)

I have discussed the findings and recommendations with the patient.
If applicable, a reminder letter will be sent to the patient
regarding the next appointment.

BI-RADS CATEGORY  2: Benign.

## 2021-12-10 ENCOUNTER — Other Ambulatory Visit: Payer: Self-pay | Admitting: Family

## 2021-12-12 ENCOUNTER — Other Ambulatory Visit: Payer: Self-pay

## 2021-12-12 MED ORDER — FUROSEMIDE 20 MG PO TABS
20.0000 mg | ORAL_TABLET | Freq: Every day | ORAL | 5 refills | Status: DC | PRN
Start: 2021-12-12 — End: 2022-06-12

## 2022-01-26 ENCOUNTER — Encounter (HOSPITAL_COMMUNITY): Payer: Self-pay | Admitting: *Deleted

## 2022-02-16 ENCOUNTER — Ambulatory Visit: Payer: Managed Care, Other (non HMO) | Admitting: Family

## 2022-02-22 ENCOUNTER — Other Ambulatory Visit: Payer: Self-pay | Admitting: Family

## 2022-03-30 ENCOUNTER — Ambulatory Visit: Payer: Managed Care, Other (non HMO) | Admitting: Family

## 2022-04-06 ENCOUNTER — Ambulatory Visit (INDEPENDENT_AMBULATORY_CARE_PROVIDER_SITE_OTHER): Payer: Managed Care, Other (non HMO) | Admitting: Family

## 2022-04-06 ENCOUNTER — Encounter: Payer: Self-pay | Admitting: Family

## 2022-04-06 ENCOUNTER — Telehealth: Payer: Self-pay | Admitting: Family

## 2022-04-06 VITALS — BP 117/46 | HR 61 | Resp 18 | Ht 67.0 in | Wt 203.0 lb

## 2022-04-06 DIAGNOSIS — Z8639 Personal history of other endocrine, nutritional and metabolic disease: Secondary | ICD-10-CM | POA: Diagnosis not present

## 2022-04-06 DIAGNOSIS — Z0000 Encounter for general adult medical examination without abnormal findings: Secondary | ICD-10-CM | POA: Diagnosis not present

## 2022-04-06 DIAGNOSIS — E785 Hyperlipidemia, unspecified: Secondary | ICD-10-CM

## 2022-04-06 DIAGNOSIS — I10 Essential (primary) hypertension: Secondary | ICD-10-CM | POA: Diagnosis not present

## 2022-04-06 DIAGNOSIS — Z23 Encounter for immunization: Secondary | ICD-10-CM | POA: Diagnosis not present

## 2022-04-06 DIAGNOSIS — F3170 Bipolar disorder, currently in remission, most recent episode unspecified: Secondary | ICD-10-CM | POA: Diagnosis not present

## 2022-04-06 DIAGNOSIS — E1142 Type 2 diabetes mellitus with diabetic polyneuropathy: Secondary | ICD-10-CM

## 2022-04-06 DIAGNOSIS — A6000 Herpesviral infection of urogenital system, unspecified: Secondary | ICD-10-CM

## 2022-04-06 DIAGNOSIS — E669 Obesity, unspecified: Secondary | ICD-10-CM

## 2022-04-06 DIAGNOSIS — Z720 Tobacco use: Secondary | ICD-10-CM

## 2022-04-06 LAB — COMPREHENSIVE METABOLIC PANEL
ALT: 19 U/L (ref 0–35)
AST: 15 U/L (ref 0–37)
Albumin: 4.1 g/dL (ref 3.5–5.2)
Alkaline Phosphatase: 69 U/L (ref 39–117)
BUN: 20 mg/dL (ref 6–23)
CO2: 28 mEq/L (ref 19–32)
Calcium: 9.3 mg/dL (ref 8.4–10.5)
Chloride: 102 mEq/L (ref 96–112)
Creatinine, Ser: 0.94 mg/dL (ref 0.40–1.20)
GFR: 70.48 mL/min (ref >60.00)
Glucose, Bld: 115 mg/dL — ABNORMAL HIGH (ref 70–99)
Potassium: 4.3 mEq/L (ref 3.5–5.1)
Sodium: 137 mEq/L (ref 135–145)
Total Bilirubin: 0.3 mg/dL (ref 0.2–1.2)
Total Protein: 7 g/dL (ref 6.0–8.3)

## 2022-04-06 LAB — LIPID PANEL
Cholesterol: 167 mg/dL (ref 0–200)
HDL: 42.8 mg/dL (ref >39.00)
LDL Cholesterol: 104 mg/dL — ABNORMAL HIGH (ref 0–99)
NonHDL: 124.11
Total CHOL/HDL Ratio: 4
Triglycerides: 101 mg/dL (ref 0.0–149.0)
VLDL: 20.2 mg/dL (ref 0.0–40.0)

## 2022-04-06 LAB — MICROALBUMIN / CREATININE URINE RATIO
Creatinine,U: 38.8 mg/dL
Microalb Creat Ratio: 1.8 mg/g (ref 0.0–30.0)
Microalb, Ur: <0.7 mg/dL (ref 0.0–1.9)

## 2022-04-06 LAB — HEMOGLOBIN A1C: Hgb A1c MFr Bld: 6.5 % (ref 4.6–6.5)

## 2022-04-06 MED ORDER — MOUNJARO 2.5 MG/0.5ML ~~LOC~~ SOAJ: 2.5000 mg | SUBCUTANEOUS | 0 refills | Status: DC

## 2022-04-06 NOTE — Progress Notes (Signed)
Subjective:   By signing my name below, I, Tanya Wilcox, attest that this documentation has been prepared under the direction and in the presence of Blue, NP 04/06/2022   Patient ID: Tanya Wilcox, female    DOB: 01/27/1971, 51 y.o.   MRN: [id number]  Chief Complaint  Patient presents with   Annual Exam    HPI Patient is in today for a comprehensive physical exam  A1C: She reports that her biometric screening showed an A1C level of 6.2 Lab Results  Component Value Date   HGBA1C 5.7 (H) 05/19/2021   Cholesterol: Her cholesterol levels are normal Lab Results  Component Value Date   CHOL 168 05/19/2021   HDL 55 05/19/2021   LDLCALC 94 05/19/2021   TRIG 97 05/19/2021   CHOLHDL 3.1 05/19/2021   Blood Pressure: Her blood pressure is normal. She is taking 10 Mg of Lisinopril, 20 Mg of Lasix, and 50 Mg of Metoprolol Succinate. She reports that she initially experienced swelling in her lower extremities but symptoms no longer appear.  BP Readings from Last 3 Encounters:  04/06/22 (!) 117/46  11/17/21 (!) 128/58  08/09/21 118/65   Pulse Readings from Last 3 Encounters:  04/06/22 61  11/17/21 (!) 58  08/09/21 (!) 59   Depression: She reports that she is not experiencing depression symptoms. She is currently taking 25 Mg of Zoloft and 5 Mg of Abilify Valtrex: She is currently taking 500 Mg of Valtrex  Shoulder Pain: She reports that her shoulder pain is resolved.   She denies having any fever, new muscle pain, joint pain , new moles, rashes, congestion, sinus pain, sore throat, palpations, cough, SOB ,wheezing,n/v/d constipation, blood in stool, dysuria, frequency, hematuria, depression, anxiety, headaches at this time  Social history: She reports that she is continuing to smoke. She is taking about a pack a day, sometimes more. She reports that her brother, Tanya Wilcox, was diagnosed with multiple myeloma. She reports no recent surgeries.  Colonoscopy: Last completed  on 10/14/2020 Pap Smear: Last completed on 02/06/2021 Mammogram: She is scheduled for a mammogram on 05/03/2022 Immunizations: She is interested in receiving an influenza and Shingles vaccine during today's visit.  Diet: She is not maintaining a healthy diet.  Dental: She is not UTD on dental exams Vision: She is UTD on vision exams   Health Maintenance Due  Topic Date Due   COVID-19 Vaccine (3 - Pfizer series) 01/05/2020   Diabetic kidney evaluation - Urine ACR  12/10/2020   Zoster Vaccines- Shingrix (2 of 2) 10/04/2021   OPHTHALMOLOGY EXAM  10/11/2021   HEMOGLOBIN A1C  11/17/2021   MAMMOGRAM  02/06/2022    Past Medical History:  Diagnosis Date   Alcohol use    Anxiety    on meds   Bipolar disorder ([place])    Depression    on meds   Diabetes ([place])    TYPE 2 ---NO MEDS -- diet controlled   Gestational diabetes    borderline II   Hyperlipidemia    on meds   Hypertension    on meds   Osteoarthritis    RIGHT knee   Tobacco abuse     Past Surgical History:  Procedure Laterality Date   CHOLECYSTECTOMY  2003   GASTRIC ROUX-EN-Y N/A 06/29/2019   Procedure: LAPAROSCOPIC ROUX-EN-Y GASTRIC BYPASS WITH UPPER ENDOSCOPY, ERAS PATHWAY;  Surgeon: Alphonsa Overall, MD;  Location: [facility];  Service: General;  Laterality: N/A;   WISDOM TOOTH EXTRACTION  Family History  Problem Relation Age of Onset   Hypertension Mother    Hyperlipidemia Mother    Colon polyps Mother 19   Alcohol abuse Father    Hyperlipidemia Sister    Hypertension Sister    Hypertension Brother    Cancer Brother        multiple myeloma   Retinal detachment Daughter    Diabetes Neg Hx    Colon cancer Neg Hx    Rectal cancer Neg Hx    Stomach cancer Neg Hx    Esophageal cancer Neg Hx     Social History   Socioeconomic History   Marital status: Single    Spouse name: Not on file   Number of children: Not on file   Years of education: Not on file   Highest education level: Not on file  Occupational  History   Occupation: BILLING    Employer: [employer]  Tobacco Use   Smoking status: Every Day    Packs/day: 1.50    Types: Cigarettes   Smokeless tobacco: Never  Vaping Use   Vaping Use: Never used  Substance and Sexual Activity   Alcohol use: Yes    Alcohol/week: 2.0 standard drinks of alcohol    Types: 2 Standard drinks or equivalent per week    Comment: occasionally   Drug use: No    Comment: some marijuana   Sexual activity: Not on file  Other Topics Concern   Not on file  Social History Narrative   Lives with daughter   Works at [employer] and [employer]   Enjoys sleeping   1 dog   Completed high school, some college   Social Determinants of Health   Financial Resource Strain: Not on file  Food Insecurity: Not on file  Transportation Needs: Not on file  Physical Activity: Not on file  Stress: Not on file  Social Connections: Not on file  Intimate Partner Violence: Not on file    Outpatient Medications Prior to Visit  Medication Sig Dispense Refill   ALPRAZolam (XANAX) 0.5 MG tablet Take 0.5 mg by mouth 2 (two) times daily as needed for anxiety.   1   ARIPiprazole (ABILIFY) 5 MG tablet Take 5 mg by mouth daily.   1   Blood Glucose Monitoring Suppl (ONETOUCH VERIO) w/Device KIT USE TO TEST BLOOD SUGAR THREE TIMES DAILY 1 kit 0   furosemide (LASIX) 20 MG tablet Take 1 tablet (20 mg total) by mouth daily as needed. 30 tablet 5   levonorgestrel (MIRENA) 20 MCG/24HR IUD 1 each by Intrauterine route once.     lisinopril (ZESTRIL) 10 MG tablet TAKE 1 TABLET(10 MG) BY MOUTH DAILY 90 tablet 1   methylPREDNISolone (MEDROL DOSEPAK) 4 MG TBPK tablet Take per package instructions 21 tablet 0   metoprolol succinate (TOPROL-XL) 50 MG 24 hr tablet TAKE 1 TABLET(50 MG) BY MOUTH DAILY WITH OR IMMEDIATELY FOLLOWING A MEAL 30 tablet 0   Multiple Vitamins-Minerals (BARIATRIC MULTIVITAMINS/IRON PO) Take 2 tablets by mouth 2 (two) times daily.     ONETOUCH VERIO test strip CHECK BLOOD  SUGAR THREE TIMES DAILY 200 strip 1   sertraline (ZOLOFT) 25 MG tablet Take 1 tablet (25 mg total) by mouth daily. 90 tablet 1   valACYclovir (VALTREX) 500 MG tablet TAKE 1 TABLET(500 MG) BY MOUTH DAILY 90 tablet 1   Facility-Administered Medications Prior to Visit  Medication Dose Route Frequency Provider Last Rate Last Admin   0.9 %  sodium chloride infusion  500  mL Intravenous Once Cirigliano, Vito V, DO        Allergies  Allergen Reactions   Desyrel [Trazodone]     Review of Systems  Constitutional:  Negative for fever.  HENT:  Negative for congestion, sinus pain and sore throat.   Respiratory:  Negative for cough, shortness of breath and wheezing.   Cardiovascular:  Negative for palpitations.  Gastrointestinal:  Negative for blood in stool, constipation, diarrhea, nausea and vomiting.  Genitourinary:  Negative for dysuria, frequency and hematuria.  Musculoskeletal:  Negative for joint pain and myalgias.  Skin:  Negative for rash.       (-) [place]  Neurological:  Negative for headaches.  Psychiatric/Behavioral:  Negative for depression. The patient is not nervous/anxious.        Objective:    Physical Exam Constitutional:      General: She is not in acute distress.    Appearance: Normal appearance. She is not ill-appearing.  HENT:     Head: Normocephalic and atraumatic.     Right Ear: Tympanic membrane, ear canal and external ear normal.     Left Ear: Tympanic membrane, ear canal and external ear normal.  Eyes:     Extraocular Movements: Extraocular movements intact.     Pupils: Pupils are equal, round, and reactive to light.  Cardiovascular:     Rate and Rhythm: Normal rate and regular rhythm.     Heart sounds: Normal heart sounds. No murmur heard.    No gallop.  Pulmonary:     Effort: Pulmonary effort is normal. No respiratory distress.     Breath sounds: Normal breath sounds. No wheezing or rales.  Abdominal:     General: Bowel sounds are normal. There is  no distension.     Palpations: Abdomen is soft.     Tenderness: There is no abdominal tenderness. There is no guarding.  Musculoskeletal:     Comments: 5/5 strength in both upper and lower extremities    Skin:    General: Skin is warm and dry.  Neurological:     Mental Status: She is alert and oriented to person, place, and time.     Deep Tendon Reflexes:     Reflex Scores:      Patellar reflexes are 2+ on the right side and 2+ on the left side. Psychiatric:        Mood and Affect: Mood normal.        Behavior: Behavior normal.        Judgment: Judgment normal.     BP (!) 117/46   Pulse 61   Resp 18   Ht $R'5\' 7"'tN$  (1.702 m)   Wt 203 lb (92.1 kg)   SpO2 100%   BMI 31.79 kg/m  Wt Readings from Last 3 Encounters:  04/06/22 203 lb (92.1 kg)  11/17/21 203 lb (92.1 kg)  08/09/21 198 lb 3.2 oz (89.9 kg)       Assessment & Plan:   Problem List Items Addressed This Visit       Unprioritized   Tobacco abuse    Discussed smoking cessation. She is not yet ready to quit but has wellbutrin on hand from her psychiatrist to start when she is ready.       Preventative health care - Primary    Wt Readings from Last 3 Encounters:  04/06/22 203 lb (92.1 kg)  11/17/21 203 lb (92.1 kg)  08/09/21 198 lb 3.2 oz (89.9 kg)  Discussed healthy diet, exercise, weight loss.  Mammogram up to date.  Colo up to date. Shingrix #2 today and flu shot today. Recommended covid booster at the pharmacy.       Obesity (BMI 30.0-34.9)    She would like to see if Darcel Bayley is covered by her insurance.  Rx sent for 2.47m dose.       Hyperlipidemia   Relevant Orders   Lipid panel   HTN (hypertension)    BP Readings from Last 3 Encounters:  04/06/22 (!) 117/46  11/17/21 (!) 128/58  08/09/21 118/65  She is taking lasix once daily.  Also on lisinopril 161mand toprol xl 502montinue same.       History of non-insulin dependent type 2 diabetes mellitus   Relevant Orders   Urine Microalbumin w/creat.  ratio   Hemoglobin A1c   Comp Met (CMET)   Genital HSV    Stable on daily valtrex suppressive therapy.        Diabetic peripheral neuropathy ([facility])    Reports overall improved.       Relevant Medications   [facility]epat[facility] ([facility] MG/0.5ML Pen   Bipolar disorder ([facility]  Reports stable on abilify/sertraline which are being prescribed by psychiatry.       Other Visit Diagnoses     Need for influenza vaccination       Relevant Orders   Flu Vaccine QUAD 62mo77mo(Fluarix, Fluzone & Alfiuria Quad PF) (Completed)      Meds ordered this encounter  Medications   tirzepatide (MOUNJARO) 2.5 MG/0.5ML Pen    Sig: Inject 2.5 mg into the skin once a week.    Dispense:  2 mL    Refill:  0    Order Specific Question:   Supervising Provider    Answer:   BLYTPenni [id number]]    I, MeliNance Pear, personally preformed the services described in this documentation.  All medical record entries made by the scribe were at my direction and in my presence.  I have reviewed the chart and discharge instructions (if applicable) and agree that the record reflects my personal performance and is accurate and complete. 04/06/2022   I,Amber Collins,acting as a scribe for MeliNance Pear.,have documented all relevant documentation on the behalf of MeliNance Pear,as directed by  MeliNance Pear while in the presence of MeliNance Pear.    MeliNance Pear

## 2022-04-06 NOTE — Assessment & Plan Note (Signed)
She would like to see if Darcel Bayley is covered by her insurance.  Rx sent for 2.'5mg'$  dose.

## 2022-04-06 NOTE — Addendum Note (Signed)
Addended by: Jeronimo Greaves on: 04/06/2022 07:34 AM   Modules accepted: Orders

## 2022-04-06 NOTE — Assessment & Plan Note (Signed)
BP Readings from Last 3 Encounters:  04/06/22 (!) 117/46  11/17/21 (!) 128/58  08/09/21 118/65   She is taking lasix once daily.  Also on lisinopril '10mg'$  and toprol xl '50mg'$  Continue same.

## 2022-04-06 NOTE — Telephone Encounter (Signed)
Please call [facility] to request DM eye exam.

## 2022-04-06 NOTE — Assessment & Plan Note (Signed)
Discussed smoking cessation. She is not yet ready to quit but has wellbutrin on hand from her psychiatrist to start when she is ready.

## 2022-04-06 NOTE — Assessment & Plan Note (Signed)
Stable on daily valtrex suppressive therapy.

## 2022-04-06 NOTE — Assessment & Plan Note (Signed)
Reports stable on abilify/sertraline which are being prescribed by psychiatry.

## 2022-04-06 NOTE — Assessment & Plan Note (Signed)
Wt Readings from Last 3 Encounters:  04/06/22 203 lb (92.1 kg)  11/17/21 203 lb (92.1 kg)  08/09/21 198 lb 3.2 oz (89.9 kg)   Discussed healthy diet, exercise, weight loss.  Mammogram up to date.  Colo up to date. Shingrix #2 today and flu shot today. Recommended covid booster at the pharmacy.

## 2022-04-06 NOTE — Patient Instructions (Signed)
Please schedule a routine dental exam.

## 2022-04-06 NOTE — Telephone Encounter (Signed)
Records release will be faxed

## 2022-04-06 NOTE — Assessment & Plan Note (Signed)
Reports overall improved.

## 2022-04-27 ENCOUNTER — Other Ambulatory Visit: Payer: Self-pay | Admitting: Family

## 2022-05-07 ENCOUNTER — Other Ambulatory Visit: Payer: Self-pay | Admitting: Obstetrics and Gynecology

## 2022-05-07 DIAGNOSIS — R928 Other abnormal and inconclusive findings on diagnostic imaging of breast: Secondary | ICD-10-CM

## 2022-05-08 ENCOUNTER — Telehealth: Payer: Self-pay | Admitting: Family

## 2022-05-08 MED ORDER — TIRZEPATIDE 5 MG/0.5ML ~~LOC~~ SOAJ: 5.0000 mg | SUBCUTANEOUS | 0 refills | Status: DC

## 2022-05-08 NOTE — Telephone Encounter (Signed)
See mychart.  

## 2022-05-14 ENCOUNTER — Telehealth: Payer: Self-pay | Admitting: Family

## 2022-05-14 ENCOUNTER — Encounter: Payer: Self-pay | Admitting: *Deleted

## 2022-05-14 NOTE — Telephone Encounter (Signed)
Please request copy of pap from Dr. Delanna Ahmadi office.

## 2022-05-14 NOTE — Telephone Encounter (Signed)
Pap requested. 

## 2022-05-16 ENCOUNTER — Other Ambulatory Visit: Payer: Managed Care, Other (non HMO)

## 2022-05-18 ENCOUNTER — Other Ambulatory Visit: Payer: Self-pay

## 2022-05-18 MED ORDER — VALACYCLOVIR HCL 500 MG PO TABS: ORAL_TABLET | ORAL | 1 refills | Status: DC

## 2022-05-19 ENCOUNTER — Other Ambulatory Visit: Payer: Self-pay | Admitting: Family

## 2022-05-20 ENCOUNTER — Telehealth: Payer: Self-pay | Admitting: Family

## 2022-05-20 NOTE — Telephone Encounter (Signed)
See mychart.  

## 2022-06-12 ENCOUNTER — Other Ambulatory Visit: Payer: Self-pay | Admitting: *Deleted

## 2022-06-12 MED ORDER — FUROSEMIDE 20 MG PO TABS: 20.0000 mg | ORAL_TABLET | Freq: Every day | ORAL | 5 refills | Status: DC | PRN

## 2022-06-12 MED ORDER — LISINOPRIL 10 MG PO TABS: ORAL_TABLET | ORAL | 1 refills | Status: DC

## 2022-06-12 NOTE — Addendum Note (Signed)
Addended by: Kem Boroughs D on: 06/12/2022 10:18 AM   Modules accepted: Orders

## 2022-07-05 ENCOUNTER — Other Ambulatory Visit: Payer: Self-pay | Admitting: Family

## 2022-07-05 NOTE — Telephone Encounter (Signed)
Would you like to increase Pt's Mounjaro to 7.'5mg'$ ?

## 2022-07-19 ENCOUNTER — Ambulatory Visit: Payer: Managed Care, Other (non HMO)

## 2022-07-19 ENCOUNTER — Ambulatory Visit
Admission: RE | Admit: 2022-07-19 | Discharge: 2022-07-19 | Disposition: A | Discharge status: Home / Self Care | Payer: Managed Care, Other (non HMO) | Source: Ambulatory Visit | Attending: Obstetrics and Gynecology | Admitting: Obstetrics and Gynecology

## 2022-07-19 DIAGNOSIS — R928 Other abnormal and inconclusive findings on diagnostic imaging of breast: Secondary | ICD-10-CM

## 2022-07-25 ENCOUNTER — Ambulatory Visit: Payer: Managed Care, Other (non HMO) | Admitting: Family

## 2022-07-25 VITALS — BP 126/60 | HR 79 | Temp 98.6°F | Resp 18 | Ht 67.0 in | Wt 192.0 lb

## 2022-07-25 DIAGNOSIS — E118 Type 2 diabetes mellitus with unspecified complications: Secondary | ICD-10-CM

## 2022-07-25 DIAGNOSIS — I10 Essential (primary) hypertension: Secondary | ICD-10-CM

## 2022-07-25 DIAGNOSIS — F419 Anxiety disorder, unspecified: Secondary | ICD-10-CM | POA: Diagnosis not present

## 2022-07-25 DIAGNOSIS — F32A Depression, unspecified: Secondary | ICD-10-CM | POA: Diagnosis not present

## 2022-07-25 MED ORDER — TIRZEPATIDE 7.5 MG/0.5ML ~~LOC~~ SOAJ: 7.5000 mg | SUBCUTANEOUS | 0 refills | Status: DC

## 2022-07-25 MED ORDER — METOPROLOL SUCCINATE ER 50 MG PO TB24: ORAL_TABLET | ORAL | 1 refills | Status: DC

## 2022-07-25 NOTE — Progress Notes (Signed)
Subjective:     Patient ID: Tanya Wilcox, female    DOB: Mar 24, 1971, 52 y.o.   MRN: [id number]  No chief complaint on file.   HPI Patient is in today for follow up.  DM2- diet controlled.  Last visit we started mounjaro 41m.  She reports that she has some nausea for 1 day following weekly injection which then resolves. She is losing weight and states her sugars have been good at home.  Lab Results  Component Value Date   HGBA1C 6.5 04/06/2022   HGBA1C 5.7 (H) 05/19/2021   HGBA1C 5.8 (H) 11/09/2020   Lab Results  Component Value Date   MICROALBUR <0.7 04/06/2022   LDLCALC 104 (H) 04/06/2022   CREATININE 0.94 04/06/2022   Obesity- she has added regular exercise.  Wt Readings from Last 3 Encounters:  07/25/22 192 lb (87.1 kg)  04/06/22 203 lb (92.1 kg)  11/17/21 203 lb (92.1 kg)   HTN- maintained on metoprolol and lisinopril.  BP Readings from Last 3 Encounters:  07/25/22 126/60  04/06/22 (!) 117/46  11/17/21 (!) 128/58    Health Maintenance Due  Topic Date Due   COVID-19 Vaccine (3 - 2023-24 season) 03/23/2022    Past Medical History:  Diagnosis Date   Alcohol use    Anxiety    on meds   Bipolar disorder ([place]    Depression    on meds   Diabetes ([place]    TYPE 2 ---NO MEDS -- diet controlled   Gestational diabetes    borderline II   Hyperlipidemia    on meds   Hypertension    on meds   Osteoarthritis    RIGHT knee   Tobacco abuse     Past Surgical History:  Procedure Laterality Date   CHOLECYSTECTOMY  2003   GASTRIC ROUX-EN-Y N/A 06/29/2019   Procedure: LAPAROSCOPIC ROUX-EN-Y GASTRIC BYPASS WITH UPPER ENDOSCOPY, ERAS PATHWAY;  Surgeon: NAlphonsa Overall MD;  Location: [facility];  Service: General;  Laterality: N/A;   WISDOM TOOTH EXTRACTION      Family History  Problem Relation Age of Onset   Hypertension Mother    Hyperlipidemia Mother    Colon polyps Mother 620  Alcohol abuse Father    Hyperlipidemia Sister    Hypertension Sister     Hypertension Brother    Cancer Brother        multiple myeloma   Retinal detachment Daughter    Diabetes Neg Hx    Colon cancer Neg Hx    Rectal cancer Neg Hx    Stomach cancer Neg Hx    Esophageal cancer Neg Hx     Social History   Socioeconomic History   Marital status: Single    Spouse name: Not on file   Number of children: Not on file   Years of education: Not on file   Highest education level: Not on file  Occupational History   Occupation: BILLING    Employer: [employer]  Tobacco Use   Smoking status: Every Day    Packs/day: 1.50    Types: Cigarettes   Smokeless tobacco: Never  Vaping Use   Vaping Use: Never used  Substance and Sexual Activity   Alcohol use: Yes    Alcohol/week: 2.0 standard drinks of alcohol    Types: 2 Standard drinks or equivalent per week    Comment: occasionally   Drug use: No    Comment: some marijuana   Sexual activity: Not on file  Other Topics Concern  Not on file  Social History Narrative   Lives with daughter   Works at [employer] and [employer]   Enjoys sleeping   1 dog   Completed high school, some college   Social Determinants of Health   Financial Resource Strain: Not on file  Food Insecurity: Not on file  Transportation Needs: Not on file  Physical Activity: Not on file  Stress: Not on file  Social Connections: Not on file  Intimate Partner Violence: Not on file    Outpatient Medications Prior to Visit  Medication Sig Dispense Refill   ALPRAZolam (XANAX) 0.5 MG tablet Take 0.5 mg by mouth 2 (two) times daily as needed for anxiety.   1   ARIPiprazole (ABILIFY) 5 MG tablet Take 5 mg by mouth daily.   1   Blood Glucose Monitoring Suppl (ONETOUCH VERIO) w/Device KIT USE TO TEST BLOOD SUGAR THREE TIMES DAILY 1 kit 0   furosemide (LASIX) 20 MG tablet Take 1 tablet (20 mg total) by mouth daily as needed. 30 tablet 5   levonorgestrel (MIRENA) 20 MCG/24HR IUD 1 each by Intrauterine route once.     lisinopril (ZESTRIL) 10  MG tablet TAKE 1 TABLET(10 MG) BY MOUTH DAILY 90 tablet 1   Multiple Vitamins-Minerals (BARIATRIC MULTIVITAMINS/IRON PO) Take 2 tablets by mouth 2 (two) times daily.     ONETOUCH VERIO test strip CHECK BLOOD SUGAR THREE TIMES DAILY 200 strip 1   sertraline (ZOLOFT) 25 MG tablet Take 1 tablet (25 mg total) by mouth daily. 90 tablet 1   valACYclovir (VALTREX) 500 MG tablet TAKE 1 TABLET(500 MG) BY MOUTH DAILY 90 tablet 1   methylPREDNISolone (MEDROL DOSEPAK) 4 MG TBPK tablet Take per package instructions 21 tablet 0   metoprolol succinate (TOPROL-XL) 50 MG 24 hr tablet TAKE 1 TABLET(50 MG) BY MOUTH DAILY WITH OR IMMEDIATELY FOLLOWING A MEAL 30 tablet 0   MOUNJARO 5 MG/0.5ML Pen INJECT THE CONTENTS OF ONE PEN  SUBCUTANEOUSLY WEEKLY AS  DIRECTED 6 mL 3   Facility-Administered Medications Prior to Visit  Medication Dose Route Frequency Provider Last Rate Last Admin   0.9 %  sodium chloride infusion  500 mL Intravenous Once Cirigliano, Vito V, DO        Allergies  Allergen Reactions   Desyrel [Trazodone]     ROS    See HPI Objective:    Physical Exam Constitutional:      General: She is not in acute distress.    Appearance: Normal appearance. She is well-developed.  HENT:     Head: Normocephalic and atraumatic.     Right Ear: External ear normal.     Left Ear: External ear normal.  Eyes:     General: No scleral icterus. Neck:     Thyroid: No thyromegaly.  Cardiovascular:     Rate and Rhythm: Normal rate and regular rhythm.     Heart sounds: Normal heart sounds. No murmur heard. Pulmonary:     Effort: Pulmonary effort is normal. No respiratory distress.     Breath sounds: Normal breath sounds. No wheezing.  Musculoskeletal:     Cervical back: Neck supple.  Skin:    General: Skin is warm and dry.  Neurological:     Mental Status: She is alert and oriented to person, place, and time.  Psychiatric:        Mood and Affect: Mood normal.        Behavior: Behavior normal.         Thought Content:  Thought content normal.        Judgment: Judgment normal.     BP 126/60   Pulse 79   Temp 98.6 F (37 C)   Resp 18   Ht _0  (1.702 m)   Wt 192 lb (87.1 kg)   SpO2 98%   BMI 30.07 kg/m  Wt Readings from Last 3 Encounters:  07/25/22 192 lb (87.1 kg)  04/06/22 203 lb (92.1 kg)  11/17/21 203 lb (92.1 kg)       Assessment & Plan:   Problem List Items Addressed This Visit       Unprioritized   HTN (hypertension)    At goal, she continues metoprolol and lisinopril.       Relevant Medications   metoprolol succinate (TOPROL-XL) 50 MG 24 hr tablet   Controlled type 2 diabetes mellitus with complication, without long-term current use of insulin (HCC)    Suspect A1C will be improved with her use of mounjaro and weight loss.  Recheck today.       Relevant Medications   tirzepatide (MOUNJARO) 7.5 MG/0.5ML Pen   Other Relevant Orders   HgB W7P   Basic Metabolic Panel (BMET)   Anxiety and depression - Primary    Reports mood is good on sertraline. Continue same.        I have discontinued Amado Nash. Koopmann's methylPREDNISolone and Mounjaro. I have also changed her metoprolol succinate. Additionally, I am having her start on tirzepatide. Lastly, I am having her maintain her levonorgestrel, ALPRAZolam, ARIPiprazole, OneTouch Verio, Multiple Vitamins-Minerals (BARIATRIC MULTIVITAMINS/IRON PO), sertraline, OneTouch Verio, valACYclovir, lisinopril, and furosemide. We will continue to administer sodium chloride.  Meds ordered this encounter  Medications   tirzepatide (MOUNJARO) 7.5 MG/0.5ML Pen    Sig: Inject 7.5 mg into the skin once a week.    Dispense:  6 mL    Refill:  0    Order Specific Question:   Supervising Provider    Answer:   Penni Homans A [[id number]]   metoprolol succinate (TOPROL-XL) 50 MG 24 hr tablet    Sig: Take with or immediately following a meal.    Dispense:  90 tablet    Refill:  1    Order Specific Question:   Supervising Provider     Answer:   Penni Homans A [[id number]]

## 2022-07-25 NOTE — Assessment & Plan Note (Signed)
At goal, she continues metoprolol and lisinopril.

## 2022-07-25 NOTE — Assessment & Plan Note (Signed)
Suspect A1C will be improved with her use of mounjaro and weight loss.  Recheck today.

## 2022-07-25 NOTE — Assessment & Plan Note (Signed)
Reports mood is good on sertraline. Continue same.

## 2022-07-25 NOTE — Addendum Note (Signed)
Addended by: Kelle Darting A on: 07/25/2022 08:07 AM   Modules accepted: Orders

## 2022-07-26 LAB — BASIC METABOLIC PANEL
BUN/Creatinine Ratio: 13 (ref 9–23)
BUN: 12 mg/dL (ref 6–24)
CO2: 21 mmol/L (ref 20–29)
Calcium: 9.4 mg/dL (ref 8.7–10.2)
Chloride: 103 mmol/L (ref 96–106)
Creatinine, Ser: 0.92 mg/dL (ref 0.57–1.00)
Glucose: 113 mg/dL — ABNORMAL HIGH (ref 70–99)
Potassium: 4.6 mmol/L (ref 3.5–5.2)
Sodium: 138 mmol/L (ref 134–144)
eGFR: 75 mL/min/{1.73_m2} (ref >59)

## 2022-07-26 LAB — HEMOGLOBIN A1C
Est. average glucose Bld gHb Est-mCnc: 120 mg/dL
Hgb A1c MFr Bld: 5.8 % — ABNORMAL HIGH (ref 4.8–5.6)

## 2022-08-02 ENCOUNTER — Other Ambulatory Visit: Payer: Self-pay | Admitting: *Deleted

## 2022-08-02 MED ORDER — METOPROLOL SUCCINATE ER 50 MG PO TB24: 50.0000 mg | ORAL_TABLET | Freq: Every day | ORAL | 1 refills | Status: DC

## 2022-10-05 ENCOUNTER — Telehealth: Payer: Self-pay | Admitting: Family

## 2022-10-05 ENCOUNTER — Ambulatory Visit: Payer: Managed Care, Other (non HMO) | Admitting: Family

## 2022-10-05 VITALS — BP 115/56 | HR 65 | Temp 98.0°F | Resp 18 | Ht 67.0 in | Wt 185.2 lb

## 2022-10-05 DIAGNOSIS — A6000 Herpesviral infection of urogenital system, unspecified: Secondary | ICD-10-CM

## 2022-10-05 DIAGNOSIS — I10 Essential (primary) hypertension: Secondary | ICD-10-CM

## 2022-10-05 DIAGNOSIS — F32A Depression, unspecified: Secondary | ICD-10-CM

## 2022-10-05 DIAGNOSIS — F419 Anxiety disorder, unspecified: Secondary | ICD-10-CM

## 2022-10-05 DIAGNOSIS — E785 Hyperlipidemia, unspecified: Secondary | ICD-10-CM

## 2022-10-05 DIAGNOSIS — E663 Overweight: Secondary | ICD-10-CM | POA: Diagnosis not present

## 2022-10-05 DIAGNOSIS — Z6829 Body mass index (BMI) 29.0-29.9, adult: Secondary | ICD-10-CM

## 2022-10-05 DIAGNOSIS — E118 Type 2 diabetes mellitus with unspecified complications: Secondary | ICD-10-CM | POA: Diagnosis not present

## 2022-10-05 MED ORDER — VALACYCLOVIR HCL 1 G PO TABS: 1000.0000 mg | ORAL_TABLET | Freq: Every day | ORAL | 1 refills | Status: DC

## 2022-10-05 MED ORDER — LISINOPRIL 10 MG PO TABS: ORAL_TABLET | ORAL | 1 refills | Status: DC

## 2022-10-05 MED ORDER — FUROSEMIDE 20 MG PO TABS: 20.0000 mg | ORAL_TABLET | Freq: Every day | ORAL | 1 refills | Status: DC | PRN

## 2022-10-05 MED ORDER — ATORVASTATIN CALCIUM 10 MG PO TABS: 10.0000 mg | ORAL_TABLET | Freq: Every day | ORAL | 1 refills | Status: DC

## 2022-10-05 MED ORDER — TIRZEPATIDE 7.5 MG/0.5ML ~~LOC~~ SOAJ: 7.5000 mg | SUBCUTANEOUS | 1 refills | Status: DC

## 2022-10-05 NOTE — Patient Instructions (Signed)
Please let me know if you develop blood pressures where the top number is consistently less than 110.

## 2022-10-05 NOTE — Telephone Encounter (Signed)
See mychart.  

## 2022-10-05 NOTE — Assessment & Plan Note (Signed)
Had recent breakout on valtrex 500mg  daily. Will increase to 1000mg  daily.

## 2022-10-05 NOTE — Assessment & Plan Note (Addendum)
Wt Readings from Last 3 Encounters:  10/05/22 185 lb 3.2 oz (84 kg)  07/25/22 192 lb (87.1 kg)  04/06/22 203 lb (92.1 kg)   Weight is coming down nicely on moujaro. I encouraged her to focus on exercise as well. Continue mounjar 7.5mg .  She is asking to increase dose. Since her A1C looks so good and she is consistently losing weight, will continue current dose and re-evaluate in 3 months.

## 2022-10-05 NOTE — Assessment & Plan Note (Signed)
Lab Results  Component Value Date   CHOL 167 04/06/2022   HDL 42.80 04/06/2022   LDLCALC 104 (H) 04/06/2022   TRIG 101.0 04/06/2022   CHOLHDL 4 04/06/2022   10 yr CV risk estimate is 22%, recommend that she restart atorvastatin at 10mg  once daily.

## 2022-10-05 NOTE — Assessment & Plan Note (Signed)
Lab Results  Component Value Date   HGBA1C 5.8 (H) 07/25/2022   HGBA1C 6.5 04/06/2022   HGBA1C 5.7 (H) 05/19/2021   Lab Results  Component Value Date   MICROALBUR <0.7 04/06/2022   LDLCALC 104 (H) 04/06/2022   CREATININE 0.92 07/25/2022   A1C looks great- continue mounjaro.

## 2022-10-05 NOTE — Assessment & Plan Note (Signed)
Stable, management per psychiatry.  

## 2022-10-05 NOTE — Progress Notes (Signed)
Subjective:   By signing my name below, I, Tanya Wilcox, attest that this documentation has been prepared under the direction and in the presence of Tanya Alar, NP. 10/05/2022.   Patient ID: Tanya Wilcox, female    DOB: [birth year], 52 y.o.   MRN: [id number]  Chief Complaint  Patient presents with   Follow-up    6 month    HPI Patient is in today for an office visit.  Lasix:  Lately she has been taking 1 tablet of 20 mg Lasix every day. However, she still feels she is sometimes retaining fluid. If she takes 2 tablets, she subsequently loses about 5 lbs.  Obesity: She continues to successfully lose weight. At this time she denies any side effects of Mounjaro, including any loss of appetite. Typically she just doesn't eat much at baseline now.  Maximum weight when we started Mounjaro was 203 lbs 03/2022. Wt Readings from Last 3 Encounters:  10/05/22 185 lb 3.2 oz (84 kg)  07/25/22 192 lb (87.1 kg)  04/06/22 203 lb (92.1 kg)   DM2:  Last A1c about 2 months ago was within normal range. Lab Results  Component Value Date   HGBA1C 5.8 (H) 07/25/2022   HTN:  Her blood pressure is well controlled in the office today.  She doesn't monitor this at home unless she is feeling symptomatic. BP Readings from Last 3 Encounters:  10/05/22 (!) 115/56  07/25/22 126/60  04/06/22 (!) 117/46   Valtrex:  Recently she endorses one breakout. This was the only one she has had in a long time. On Valtrex 500 mg.  Mood:  Stable on sertraline. She follows with psychiatry and sees a therapist.  IUD:  Her Mirena is still in place. She denies menses. Following with Dr. Matthew Wilcox.  Past Medical History:  Diagnosis Date   Alcohol use    Anxiety    on meds   Bipolar disorder ([place])    Depression    on meds   Diabetes ([place])    TYPE 2 ---NO MEDS -- diet controlled   Gestational diabetes    borderline II   Hyperlipidemia    on meds   Hypertension    on meds   Osteoarthritis    RIGHT knee    Tobacco abuse     Past Surgical History:  Procedure Laterality Date   CHOLECYSTECTOMY  2003   GASTRIC ROUX-EN-Y N/A 06/29/2019   Procedure: LAPAROSCOPIC ROUX-EN-Y GASTRIC BYPASS WITH UPPER ENDOSCOPY, ERAS PATHWAY;  Surgeon: Tanya Overall, MD;  Location: [facility];  Service: General;  Laterality: N/A;   WISDOM TOOTH EXTRACTION      Family History  Problem Relation Age of Onset   Hypertension Mother    Hyperlipidemia Mother    Colon polyps Mother 13   Alcohol abuse Father    Hyperlipidemia Sister    Hypertension Sister    Hypertension Brother    Cancer Brother        multiple myeloma   Retinal detachment Daughter    Diabetes Neg Hx    Colon cancer Neg Hx    Rectal cancer Neg Hx    Stomach cancer Neg Hx    Esophageal cancer Neg Hx     Social History   Socioeconomic History   Marital status: Single    Spouse name: Not on file   Number of children: Not on file   Years of education: Not on file   Highest education level: Not on file  Occupational History  Occupation: Sport and exercise psychologist: [employer]  Tobacco Use   Smoking status: Every Day    Packs/day: 1.5    Types: Cigarettes   Smokeless tobacco: Never  Vaping Use   Vaping Use: Never used  Substance and Sexual Activity   Alcohol use: Yes    Alcohol/week: 2.0 standard drinks of alcohol    Types: 2 Standard drinks or equivalent per week    Comment: occasionally   Drug use: No    Comment: some marijuana   Sexual activity: Not on file  Other Topics Concern   Not on file  Social History Narrative   Lives with daughter   Works at [employer] and [employer]   Enjoys sleeping   1 dog   Completed high school, some college   Social Determinants of Health   Financial Resource Strain: Not on file  Food Insecurity: Not on file  Transportation Needs: Not on file  Physical Activity: Not on file  Stress: Not on file  Social Connections: Not on file  Intimate Partner Violence: Not on file    Outpatient Medications  Prior to Visit  Medication Sig Dispense Refill   ALPRAZolam (XANAX) 0.5 MG tablet Take 0.5 mg by mouth 2 (two) times daily as needed for anxiety.   1   ARIPiprazole (ABILIFY) 5 MG tablet Take 5 mg by mouth daily.   1   Blood Glucose Monitoring Suppl (ONETOUCH VERIO) w/Device KIT USE TO TEST BLOOD SUGAR THREE TIMES DAILY 1 kit 0   levonorgestrel (MIRENA) 20 MCG/24HR IUD 1 each by Intrauterine route once.     metoprolol succinate (TOPROL-XL) 50 MG 24 hr tablet Take 1 tablet (50 mg total) by mouth daily. Take with or immediately following a meal. 90 tablet 1   Multiple Vitamins-Minerals (BARIATRIC MULTIVITAMINS/IRON PO) Take 2 tablets by mouth 2 (two) times daily.     ONETOUCH VERIO test strip CHECK BLOOD SUGAR THREE TIMES DAILY 200 strip 1   sertraline (ZOLOFT) 25 MG tablet Take 1 tablet (25 mg total) by mouth daily. 90 tablet 1   furosemide (LASIX) 20 MG tablet Take 1 tablet (20 mg total) by mouth daily as needed. 30 tablet 5   lisinopril (ZESTRIL) 10 MG tablet TAKE 1 TABLET(10 MG) BY MOUTH DAILY 90 tablet 1   tirzepatide (MOUNJARO) 7.5 MG/0.5ML Pen Inject 7.5 mg into the skin once a week. 6 mL 0   valACYclovir (VALTREX) 500 MG tablet TAKE 1 TABLET(500 MG) BY MOUTH DAILY 90 tablet 1   Facility-Administered Medications Prior to Visit  Medication Dose Route Frequency Provider Last Rate Last Admin   0.9 %  sodium chloride infusion  500 mL Intravenous Once Wilcox, Tanya V, DO        Allergies  Allergen Reactions   Desyrel [Trazodone]     ROS     Objective:    Physical Exam Constitutional:      Appearance: Normal appearance.  HENT:     Head: Normocephalic and atraumatic.     Right Ear: Tympanic membrane, ear canal and external ear normal.     Left Ear: Tympanic membrane, ear canal and external ear normal.  Eyes:     Extraocular Movements: Extraocular movements intact.     Pupils: Pupils are equal, round, and reactive to light.  Cardiovascular:     Rate and Rhythm: Normal rate  and regular rhythm.     Heart sounds: Normal heart sounds. No murmur heard.    No gallop.  Pulmonary:  Effort: Pulmonary effort is normal. No respiratory distress.     Breath sounds: Normal breath sounds. No wheezing or rales.  Skin:    General: Skin is warm and dry.  Neurological:     General: No focal deficit present.     Mental Status: She is alert and oriented to person, place, and time.  Psychiatric:        Mood and Affect: Mood normal.        Behavior: Behavior normal.     BP (!) 115/56   Pulse 65   Temp 98 F (36.7 C)   Resp 18   Ht 5\' 7"  (1.702 m)   Wt 185 lb 3.2 oz (84 kg)   SpO2 98%   BMI 29.01 kg/m  Wt Readings from Last 3 Encounters:  10/05/22 185 lb 3.2 oz (84 kg)  07/25/22 192 lb (87.1 kg)  04/06/22 203 lb (92.1 kg)   Assessment & Plan:   Problem List Items Addressed This Visit       Unprioritized   Overweight with body mass index (BMI) of 29 to 29.9 in adult    Wt Readings from Last 3 Encounters:  10/05/22 185 lb 3.2 oz (84 kg)  07/25/22 192 lb (87.1 kg)  04/06/22 203 lb (92.1 kg)  Weight is coming down nicely on moujaro. I encouraged her to focus on exercise as well. Continue mounjar 7.5mg .  She is asking to increase dose. Since her A1C looks so good and she is consistently losing weight, will continue current dose and re-evaluate in 3 months.       Hyperlipidemia    Lab Results  Component Value Date   CHOL 167 04/06/2022   HDL 42.80 04/06/2022   LDLCALC 104 (H) 04/06/2022   TRIG 101.0 04/06/2022   CHOLHDL 4 04/06/2022  10 yr CV risk estimate is 22%, recommend that she restart atorvastatin at 10mg  once daily.       Relevant Medications   lisinopril (ZESTRIL) 10 MG tablet   furosemide (LASIX) 20 MG tablet   atorvastatin (LIPITOR) 10 MG tablet   HTN (hypertension) - Primary   Relevant Medications   lisinopril (ZESTRIL) 10 MG tablet   furosemide (LASIX) 20 MG tablet   atorvastatin (LIPITOR) 10 MG tablet   Genital HSV    Had recent  breakout on valtrex 500mg  daily. Will increase to 1000mg  daily.       Relevant Medications   valACYclovir (VALTREX) 1000 MG tablet   Controlled type 2 diabetes mellitus with complication, without long-term current use of insulin (HCC)    Lab Results  Component Value Date   HGBA1C 5.8 (H) 07/25/2022   HGBA1C 6.5 04/06/2022   HGBA1C 5.7 (H) 05/19/2021   Lab Results  Component Value Date   MICROALBUR <0.7 04/06/2022   LDLCALC 104 (H) 04/06/2022   CREATININE 0.92 07/25/2022  A1C looks great- continue mounjaro.       Relevant Medications   tirzepatide (MOUNJARO) 7.5 MG/0.5ML Pen   lisinopril (ZESTRIL) 10 MG tablet   atorvastatin (LIPITOR) 10 MG tablet   Anxiety and depression    Stable, management per psychiatry.         Meds ordered this encounter  Medications   valACYclovir (VALTREX) 1000 MG tablet    Sig: Take 1 tablet (1,000 mg total) by mouth daily.    Dispense:  90 tablet    Refill:  1    Order Specific Question:   Supervising Provider    Answer:   Penni Homans  A [[id number]]   tirzepatide (MOUNJARO) 7.5 MG/0.5ML Pen    Sig: Inject 7.5 mg into the skin once a week.    Dispense:  6 mL    Refill:  1    Order Specific Question:   Supervising Provider    Answer:   Penni Homans A [[id number]]   lisinopril (ZESTRIL) 10 MG tablet    Sig: TAKE 1 TABLET(10 MG) BY MOUTH DAILY    Dispense:  90 tablet    Refill:  1    Order Specific Question:   Supervising Provider    Answer:   Penni Homans A [[id number]]   furosemide (LASIX) 20 MG tablet    Sig: Take 1 tablet (20 mg total) by mouth daily as needed.    Dispense:  90 tablet    Refill:  1    Order Specific Question:   Supervising Provider    Answer:   Penni Homans A [[id number]]   atorvastatin (LIPITOR) 10 MG tablet    Sig: Take 1 tablet (10 mg total) by mouth daily.    Dispense:  90 tablet    Refill:  1    Order Specific Question:   Supervising Provider    Answer:   Penni Homans A [[id number]]    I, Nance Pear, NP, personally  preformed the services described in this documentation.  All medical record entries made by the scribe were at my direction and in my presence.  I have reviewed the chart and discharge instructions (if applicable) and agree that the record reflects my personal performance and is accurate and complete. 10/05/2022.  I,Mathew Stumpf,acting as a Education administrator for [employer], NP.,have documented all relevant documentation on the behalf of Nance Pear, NP,as directed by  Nance Pear, NP while in the presence of Nance Pear, NP.   Nance Pear, NP

## 2022-11-05 ENCOUNTER — Encounter: Payer: Self-pay | Admitting: *Deleted

## 2022-11-20 LAB — HM DIABETES EYE EXAM

## 2023-01-08 ENCOUNTER — Encounter (HOSPITAL_BASED_OUTPATIENT_CLINIC_OR_DEPARTMENT_OTHER): Payer: Self-pay

## 2023-01-08 ENCOUNTER — Telehealth: Payer: Self-pay | Admitting: Family

## 2023-01-08 ENCOUNTER — Other Ambulatory Visit (HOSPITAL_BASED_OUTPATIENT_CLINIC_OR_DEPARTMENT_OTHER): Payer: Self-pay

## 2023-01-08 ENCOUNTER — Ambulatory Visit: Payer: Managed Care, Other (non HMO) | Admitting: Family

## 2023-01-08 VITALS — BP 136/71 | HR 54 | Temp 98.4°F | Resp 16 | Wt 188.0 lb

## 2023-01-08 DIAGNOSIS — E118 Type 2 diabetes mellitus with unspecified complications: Secondary | ICD-10-CM | POA: Diagnosis not present

## 2023-01-08 DIAGNOSIS — Z720 Tobacco use: Secondary | ICD-10-CM

## 2023-01-08 DIAGNOSIS — E785 Hyperlipidemia, unspecified: Secondary | ICD-10-CM | POA: Diagnosis not present

## 2023-01-08 DIAGNOSIS — Z7985 Long-term (current) use of injectable non-insulin antidiabetic drugs: Secondary | ICD-10-CM

## 2023-01-08 DIAGNOSIS — I10 Essential (primary) hypertension: Secondary | ICD-10-CM

## 2023-01-08 DIAGNOSIS — L84 Corns and callosities: Secondary | ICD-10-CM

## 2023-01-08 DIAGNOSIS — R600 Localized edema: Secondary | ICD-10-CM | POA: Insufficient documentation

## 2023-01-08 DIAGNOSIS — R7989 Other specified abnormal findings of blood chemistry: Secondary | ICD-10-CM

## 2023-01-08 LAB — COMPREHENSIVE METABOLIC PANEL
ALT: 54 U/L — ABNORMAL HIGH (ref 0–35)
AST: 44 U/L — ABNORMAL HIGH (ref 0–37)
Albumin: 4.3 g/dL (ref 3.5–5.2)
Alkaline Phosphatase: 54 U/L (ref 39–117)
BUN: 13 mg/dL (ref 6–23)
CO2: 27 mEq/L (ref 19–32)
Calcium: 9.4 mg/dL (ref 8.4–10.5)
Chloride: 103 mEq/L (ref 96–112)
Creatinine, Ser: 0.87 mg/dL (ref 0.40–1.20)
GFR: 76.93 mL/min (ref >60.00)
Glucose, Bld: 102 mg/dL — ABNORMAL HIGH (ref 70–99)
Potassium: 4.5 mEq/L (ref 3.5–5.1)
Sodium: 137 mEq/L (ref 135–145)
Total Bilirubin: 0.5 mg/dL (ref 0.2–1.2)
Total Protein: 7.2 g/dL (ref 6.0–8.3)

## 2023-01-08 LAB — LIPID PANEL
Cholesterol: 117 mg/dL (ref 0–200)
HDL: 50.3 mg/dL (ref >39.00)
LDL Cholesterol: 53 mg/dL (ref 0–99)
NonHDL: 66.85
Total CHOL/HDL Ratio: 2
Triglycerides: 67 mg/dL (ref 0.0–149.0)
VLDL: 13.4 mg/dL (ref 0.0–40.0)

## 2023-01-08 LAB — HEMOGLOBIN A1C: Hgb A1c MFr Bld: 6 % (ref 4.6–6.5)

## 2023-01-08 MED ORDER — TIRZEPATIDE 7.5 MG/0.5ML ~~LOC~~ SOAJ
7.5000 mg | SUBCUTANEOUS | 1 refills | Status: DC
  Filled 2023-01-08: qty 2, 28d supply, fill #0
  Filled 2023-01-18 – 2023-01-28 (×3): qty 2, 28d supply, fill #1
  Filled 2023-02-28: qty 2, 28d supply, fill #2
  Filled 2023-03-28: qty 2, 28d supply, fill #3

## 2023-01-08 MED ORDER — METOPROLOL SUCCINATE ER 50 MG PO TB24
50.0000 mg | ORAL_TABLET | Freq: Every day | ORAL | 1 refills | Status: DC
  Filled 2023-01-08 – 2023-03-28 (×3): qty 30, 30d supply, fill #0

## 2023-01-08 NOTE — Assessment & Plan Note (Signed)
Blood pressure well-controlled in the office, but patient reported a recent episode of significantly elevated blood pressure at home. Currently on Lisinopril 10mg  and Metoprolol 50mg . -Advise patient to monitor blood pressure at home once or twice daily and report any high readings.

## 2023-01-08 NOTE — Telephone Encounter (Addendum)
LFT's are elevated.  If she is drinking alcohol please discontinue. Please repeat lft in 2 weeks, dx elevated lft.   A1C remains at goal

## 2023-01-08 NOTE — Assessment & Plan Note (Signed)
Last A1c was well-controlled. Patient has been unable to obtain [facility] for several months. -Check A1c today. -Attempt to fill [facility] prescription at hospital-based pharmacy.

## 2023-01-08 NOTE — Patient Instructions (Signed)
VISIT SUMMARY:  During your recent visit, we discussed several health concerns including severe pain in your left foot, high blood pressure, diabetes, weight fluctuations, and your smoking habit. We have outlined a plan to address each of these issues.  YOUR PLAN:  -FOOT PAIN: You have severe pain in your left foot due to hard deposits, known as calcifications. We have referred you to a foot specialist (podiatrist) for further evaluation and treatment.  -HYPERTENSION: Your blood pressure was well-controlled during your visit, but you reported a recent episode of high blood pressure at home. Please continue to monitor your blood pressure at home once or twice daily and let us know if you have any high readings.  -TYPE 2 DIABETES MELLITUS: Your diabetes has been well-controlled, but you've had difficulty obtaining your diabetes medication, Mounjaro. We will check your blood sugar level (A1c) today and try to fill your [facility] prescription at our hospital-based pharmacy.  -WEIGHT FLUCTUATION AND ANKLE SWELLING: You've reported significant changes in your weight and swelling in your left ankle. We will order a heart test (echocardiogram) to check your heart function, as these symptoms could be related to your heart.  -TOBACCO USE: You currently smoke and your brother was recently diagnosed with advanced lung cancer. We will order a lung cancer screening test and recommend that you stop taking Bupropion, as it hasn't helped you quit smoking and could interact with your blood pressure medication, Metoprolol.  INSTRUCTIONS:  Please follow up in 3 months to reassess your ankle swelling and weight fluctuations. In the meantime, continue to monitor your blood pressure and blood sugar levels at home, and report any high readings or significant changes.

## 2023-01-08 NOTE — Telephone Encounter (Signed)
Electronic request sent 

## 2023-01-08 NOTE — Addendum Note (Signed)
Addended by: Wilford Corner on: 01/08/2023 09:58 AM   Modules accepted: Orders

## 2023-01-08 NOTE — Progress Notes (Signed)
Subjective:     Patient ID: Tanya Wilcox, female    DOB: [birth year], 52 y.o.   MRN: [id number]  Chief Complaint  Patient presents with   Diabetes    Here for follow up   Hypertension    Here for follow up   Foot Pain    Complains of foot pain due to calluses     HPI  Discussed the use of AI scribe software for clinical note transcription with the patient, who gave verbal consent to proceed.  History of Present Illness   The patient, with a history of hypertension, diabetes, and a history of weight loss surgery, presents for routine follow-up. She reports severe pain in the left foot due to calluses.   She has also been monitoring her blood pressure at home due to a recent episode of hypertension, with a reading of 180/110, following the use of melatonin. She has since discontinued the melatonin.  The patient also reports difficulty obtaining her diabetes medication, Mounjaro, from her usual pharmacy. She has been checking her blood sugar at home and reports readings have not been over 145.  She also reports fluctuations in weight, with rapid increases and decreases of up to 10 pounds within a few days. She notes some swelling in the ankles, particularly the left, during these times of weight gain. Notes that her weight will come down on its own.  The patient is a current smoker and has recently learned of a sibling's diagnosis of stage 4 lung cancer.  She also reports taking pantoprazole, prescribed by her surgeon, and bupropion, prescribed by her psychiatrist to aid in smoking cessation. she notes that she is not having success quitting smoking.    She also takes Xanax, Zoloft, and Abilify for psychiatric management. Reports good mood.          Health Maintenance Due  Topic Date Due   COVID-19 Vaccine (5 - 2023-24 season) 03/23/2022   OPHTHALMOLOGY EXAM  11/01/2022    Past Medical History:  Diagnosis Date   Alcohol use    Anxiety    on meds   Bipolar disorder  (HCC)    Depression    on meds   Diabetes (HCC)    TYPE 2 ---NO MEDS -- diet controlled   Gestational diabetes    borderline II   Hyperlipidemia    on meds   Hypertension    on meds   Osteoarthritis    RIGHT knee   Tobacco abuse     Past Surgical History:  Procedure Laterality Date   CHOLECYSTECTOMY  2003   GASTRIC ROUX-EN-Y N/A 06/29/2019   Procedure: LAPAROSCOPIC ROUX-EN-Y GASTRIC BYPASS WITH UPPER ENDOSCOPY, ERAS PATHWAY;  Surgeon: Ovidio Kin, MD;  Location: [facility];  Service: General;  Laterality: N/A;   WISDOM TOOTH EXTRACTION      Family History  Problem Relation Age of Onset   Hypertension Mother    Hyperlipidemia Mother    Colon polyps Mother 22   Alcohol abuse Father    Hyperlipidemia Sister    Hypertension Sister    Hypertension Brother    Cancer Brother        multiple myeloma   Retinal detachment Daughter    Diabetes Neg Hx    Colon cancer Neg Hx    Rectal cancer Neg Hx    Stomach cancer Neg Hx    Esophageal cancer Neg Hx     Social History   Socioeconomic History   Marital status: Single  Spouse name: Not on file   Number of children: Not on file   Years of education: Not on file   Highest education level: Not on file  Occupational History   Occupation: BILLING    Employer: [employer]  Tobacco Use   Smoking status: Every Day    Packs/day: 1.5    Types: Cigarettes   Smokeless tobacco: Never  Vaping Use   Vaping Use: Never used  Substance and Sexual Activity   Alcohol use: Yes    Alcohol/week: 2.0 standard drinks of alcohol    Types: 2 Standard drinks or equivalent per week    Comment: occasionally   Drug use: No    Comment: some marijuana   Sexual activity: Not on file  Other Topics Concern   Not on file  Social History Narrative   Lives with daughter   Works at [employer] and [employer]   Enjoys sleeping   1 dog   Completed high school, some college   Social Determinants of Health   Financial Resource Strain: Not on file   Food Insecurity: Not on file  Transportation Needs: Not on file  Physical Activity: Not on file  Stress: Not on file  Social Connections: Not on file  Intimate Partner Violence: Not on file    Outpatient Medications Prior to Visit  Medication Sig Dispense Refill   ALPRAZolam (XANAX) 0.5 MG tablet Take 0.5 mg by mouth 2 (two) times daily as needed for anxiety.   1   ARIPiprazole (ABILIFY) 5 MG tablet Take 5 mg by mouth daily.   1   atorvastatin (LIPITOR) 10 MG tablet Take 1 tablet (10 mg total) by mouth daily. 90 tablet 1   Blood Glucose Monitoring Suppl (ONETOUCH VERIO) w/Device KIT USE TO TEST BLOOD SUGAR THREE TIMES DAILY 1 kit 0   furosemide (LASIX) 20 MG tablet Take 1 tablet (20 mg total) by mouth daily as needed. 90 tablet 1   levonorgestrel (MIRENA) 20 MCG/24HR IUD 1 each by Intrauterine route once.     lisinopril (ZESTRIL) 10 MG tablet TAKE 1 TABLET(10 MG) BY MOUTH DAILY 90 tablet 1   Multiple Vitamins-Minerals (BARIATRIC MULTIVITAMINS/IRON PO) Take 2 tablets by mouth 2 (two) times daily.     ONETOUCH VERIO test strip CHECK BLOOD SUGAR THREE TIMES DAILY 200 strip 1   pantoprazole (PROTONIX) 40 MG tablet Take 40 mg by mouth daily.     sertraline (ZOLOFT) 25 MG tablet Take 1 tablet (25 mg total) by mouth daily. 90 tablet 1   valACYclovir (VALTREX) 1000 MG tablet Take 1 tablet (1,000 mg total) by mouth daily. 90 tablet 1   buPROPion (WELLBUTRIN SR) 150 MG 12 hr tablet TAKE 1 TABLET BY MOUTH EVERY MORNING FOR DEPRESSION     metoprolol succinate (TOPROL-XL) 50 MG 24 hr tablet Take 1 tablet (50 mg total) by mouth daily. Take with or immediately following a meal. 90 tablet 1   tirzepatide (MOUNJARO) 7.5 MG/0.5ML Pen Inject 7.5 mg into the skin once a week. 6 mL 1   Facility-Administered Medications Prior to Visit  Medication Dose Route Frequency Provider Last Rate Last Admin   0.9 %  sodium chloride infusion  500 mL Intravenous Once Cirigliano, Vito V, DO        Allergies  Allergen  Reactions   Desyrel [Trazodone]     ROS See HPI     Objective:    Physical Exam Constitutional:      General: She is not in acute distress.  Appearance: Normal appearance. She is well-developed.  HENT:     Head: Normocephalic and atraumatic.     Right Ear: External ear normal.     Left Ear: External ear normal.  Eyes:     General: No scleral icterus. Neck:     Thyroid: No thyromegaly.  Cardiovascular:     Rate and Rhythm: Normal rate and regular rhythm.     Heart sounds: Normal heart sounds. No murmur heard. Pulmonary:     Effort: Pulmonary effort is normal. No respiratory distress.     Breath sounds: Normal breath sounds. No wheezing.  Musculoskeletal:     Cervical back: Neck supple.  Skin:    General: Skin is warm and dry.     Comments: 2 large calluses left foot, plantar surface  Neurological:     Mental Status: She is alert and oriented to person, place, and time.  Psychiatric:        Mood and Affect: Mood normal.        Behavior: Behavior normal.        Thought Content: Thought content normal.        Judgment: Judgment normal.      BP 136/71 (BP Location: Right Arm, Patient Position: Sitting, Cuff Size: Small)   Pulse (!) 54   Temp 98.4 F (36.9 C) (Oral)   Resp 16   Wt 188 lb (85.3 kg)   SpO2 100%   BMI 29.44 kg/m  Wt Readings from Last 3 Encounters:  01/08/23 188 lb (85.3 kg)  10/05/22 185 lb 3.2 oz (84 kg)  07/25/22 192 lb (87.1 kg)       Assessment & Plan:   Problem List Items Addressed This Visit       Unprioritized   Tobacco abuse    Counseled on cessation. Patient is a current smoker with a pack per day habit. Patient's brother was recently diagnosed with stage 4 lung cancer. -Order a lung cancer screening CT. -Discontinue Bupropion due to lack of efficacy in smoking cessation and potential drug interaction with Metoprolol.      Relevant Orders   CT CHEST LUNG CA SCREEN LOW DOSE W/O CM   Lower extremity edema    OK today but I  am concerned about her rapid weight gain episodes. Will update 2D echo. She continues furosemide 20mg  once daily PRN.       Relevant Orders   ECHOCARDIOGRAM COMPLETE   Hyperlipidemia   Relevant Medications   metoprolol succinate (TOPROL-XL) 50 MG 24 hr tablet   Other Relevant Orders   Lipid panel   HTN (hypertension)    Blood pressure well-controlled in the office, but patient reported a recent episode of significantly elevated blood pressure at home. Currently on Lisinopril 10mg  and Metoprolol 50mg . -Advise patient to monitor blood pressure at home once or twice daily and report any high readings.      Relevant Medications   metoprolol succinate (TOPROL-XL) 50 MG 24 hr tablet   Controlled type 2 diabetes mellitus with complication, without long-term current use of insulin (HCC)     Last A1c was well-controlled. Patient has been unable to obtain [facility] for several months. -Check A1c today. -Attempt to fill [facility] prescription at hospital-based pharmacy.      Relevant Medications   tirzepatide (MOUNJARO) 7.5 MG/0.5ML Pen   Other Relevant Orders   HgB A1c   Comp Met (CMET)   Other Visit Diagnoses     Foot callus    -  Primary  Relevant Orders   Ambulatory referral to Podiatry       I have discontinued Larena Glassman. Vanriper's buPROPion. I am also having her maintain her levonorgestrel, ALPRAZolam, ARIPiprazole, OneTouch Verio, Multiple Vitamins-Minerals (BARIATRIC MULTIVITAMINS/IRON PO), sertraline, OneTouch Verio, valACYclovir, lisinopril, furosemide, atorvastatin, pantoprazole, tirzepatide, and metoprolol succinate. We will continue to administer sodium chloride.  Meds ordered this encounter  Medications   tirzepatide (MOUNJARO) 7.5 MG/0.5ML Pen    Sig: Inject 7.5 mg into the skin once a week.    Dispense:  6 mL    Refill:  1    Order Specific Question:   Supervising Provider    Answer:   Danise Edge A [[id number]]   metoprolol succinate (TOPROL-XL) 50 MG 24 hr tablet     Sig: Take 1 tablet (50 mg total) by mouth daily. Take with or immediately following a meal.    Dispense:  90 tablet    Refill:  1    Order Specific Question:   Supervising Provider    Answer:   Danise Edge A [[id number]]

## 2023-01-08 NOTE — Assessment & Plan Note (Signed)
OK today but I am concerned about her rapid weight gain episodes. Will update 2D echo. She continues furosemide 20mg  once daily PRN.

## 2023-01-08 NOTE — Assessment & Plan Note (Signed)
Counseled on cessation. Patient is a current smoker with a pack per day habit. Patient's brother was recently diagnosed with stage 4 lung cancer. -Order a lung cancer screening CT. -Discontinue Bupropion due to lack of efficacy in smoking cessation and potential drug interaction with Metoprolol.

## 2023-01-08 NOTE — Telephone Encounter (Signed)
Please request copy of recent eye exam from [facility].

## 2023-01-09 ENCOUNTER — Encounter: Payer: Self-pay | Admitting: Optometry

## 2023-01-09 NOTE — Telephone Encounter (Signed)
Left message on machine to call back  

## 2023-01-09 NOTE — Telephone Encounter (Signed)
Pt called back and labs reviewed 2 week lab appt scheduled

## 2023-01-18 ENCOUNTER — Other Ambulatory Visit: Payer: Self-pay

## 2023-01-18 ENCOUNTER — Ambulatory Visit: Payer: Managed Care, Other (non HMO)

## 2023-01-18 ENCOUNTER — Encounter: Payer: Self-pay | Admitting: Podiatry

## 2023-01-18 ENCOUNTER — Ambulatory Visit: Payer: Managed Care, Other (non HMO) | Admitting: Podiatry

## 2023-01-18 ENCOUNTER — Other Ambulatory Visit (HOSPITAL_COMMUNITY): Payer: Self-pay

## 2023-01-18 VITALS — BP 141/73

## 2023-01-18 DIAGNOSIS — L84 Corns and callosities: Secondary | ICD-10-CM

## 2023-01-18 DIAGNOSIS — M216X2 Other acquired deformities of left foot: Secondary | ICD-10-CM | POA: Diagnosis not present

## 2023-01-18 DIAGNOSIS — M2010 Hallux valgus (acquired), unspecified foot: Secondary | ICD-10-CM

## 2023-01-19 ENCOUNTER — Encounter: Payer: Self-pay | Admitting: Podiatry

## 2023-01-19 NOTE — Progress Notes (Signed)
This patient presents to the office with chief complaint of painful callus on her forefeet  B/L.  She says these callus are painful walking  in her shoes.  She has treated self by trimming her callus and presents to the office for evaluation and treatment of her painful callus.  She has history of diabetic neuropathy.    Vascular  Dorsalis pedis and posterior tibial pulses are palpable  B/L.  Capillary return  WNL.  Temperature gradient is  WNL.  Skin turgor  WNL  Sensorium  Senn Weinstein monofilament wire  WNL. Normal tactile sensation.  Nail Exam  Patient has normal nails with no evidence of bacterial or fungal infection.  Orthopedic  Exam  Muscle tone and muscle strength  WNL.  No limitations of motion feet  B/L.  No crepitus or joint effusion noted.  HAV  B/L.  Plantar flexed metatarsal.  Skin  No open lesions.  Normal skin texture and turgor. Porokeratosis forefeet  B/L.  IE  Debride porokeratosis  B/L.  Discussed conservative treatment vs surgical treatment.  Helane Gunther DPM

## 2023-01-22 ENCOUNTER — Other Ambulatory Visit (HOSPITAL_BASED_OUTPATIENT_CLINIC_OR_DEPARTMENT_OTHER): Payer: Self-pay

## 2023-01-23 ENCOUNTER — Other Ambulatory Visit (INDEPENDENT_AMBULATORY_CARE_PROVIDER_SITE_OTHER): Payer: Managed Care, Other (non HMO)

## 2023-01-23 DIAGNOSIS — R7989 Other specified abnormal findings of blood chemistry: Secondary | ICD-10-CM | POA: Diagnosis not present

## 2023-01-23 LAB — COMPREHENSIVE METABOLIC PANEL
ALT: 26 U/L (ref 0–35)
AST: 22 U/L (ref 0–37)
Albumin: 4.4 g/dL (ref 3.5–5.2)
Alkaline Phosphatase: 56 U/L (ref 39–117)
BUN: 17 mg/dL (ref 6–23)
CO2: 27 mEq/L (ref 19–32)
Calcium: 9.3 mg/dL (ref 8.4–10.5)
Chloride: 102 mEq/L (ref 96–112)
Creatinine, Ser: 0.91 mg/dL (ref 0.40–1.20)
GFR: 72.87 mL/min (ref >60.00)
Glucose, Bld: 86 mg/dL (ref 70–99)
Potassium: 3.7 mEq/L (ref 3.5–5.1)
Sodium: 136 mEq/L (ref 135–145)
Total Bilirubin: 0.5 mg/dL (ref 0.2–1.2)
Total Protein: 7.1 g/dL (ref 6.0–8.3)

## 2023-01-28 ENCOUNTER — Other Ambulatory Visit: Payer: Self-pay

## 2023-01-30 ENCOUNTER — Ambulatory Visit (HOSPITAL_BASED_OUTPATIENT_CLINIC_OR_DEPARTMENT_OTHER): Payer: Managed Care, Other (non HMO)

## 2023-02-28 ENCOUNTER — Other Ambulatory Visit: Payer: Self-pay

## 2023-02-28 ENCOUNTER — Other Ambulatory Visit (HOSPITAL_COMMUNITY): Payer: Self-pay

## 2023-02-28 ENCOUNTER — Encounter (HOSPITAL_COMMUNITY): Payer: Self-pay

## 2023-03-07 ENCOUNTER — Encounter (INDEPENDENT_AMBULATORY_CARE_PROVIDER_SITE_OTHER): Payer: Self-pay

## 2023-03-28 ENCOUNTER — Other Ambulatory Visit: Payer: Self-pay | Admitting: Family

## 2023-03-28 ENCOUNTER — Encounter: Payer: Self-pay | Admitting: Pharmacist

## 2023-03-28 ENCOUNTER — Other Ambulatory Visit (HOSPITAL_COMMUNITY): Payer: Self-pay

## 2023-03-28 ENCOUNTER — Other Ambulatory Visit: Payer: Self-pay

## 2023-03-28 MED ORDER — PANTOPRAZOLE SODIUM 40 MG PO TBEC
40.0000 mg | DELAYED_RELEASE_TABLET | Freq: Every day | ORAL | 0 refills | Status: DC
  Filled 2023-03-28: qty 90, 90d supply, fill #0

## 2023-03-29 ENCOUNTER — Other Ambulatory Visit (HOSPITAL_COMMUNITY): Payer: Self-pay

## 2023-04-04 ENCOUNTER — Other Ambulatory Visit: Payer: Self-pay | Admitting: Family

## 2023-04-10 ENCOUNTER — Ambulatory Visit: Payer: Managed Care, Other (non HMO) | Admitting: Family

## 2023-04-10 VITALS — BP 128/62 | HR 66 | Temp 98.2°F | Resp 18 | Ht 67.0 in | Wt 184.6 lb

## 2023-04-10 DIAGNOSIS — I10 Essential (primary) hypertension: Secondary | ICD-10-CM

## 2023-04-10 DIAGNOSIS — E118 Type 2 diabetes mellitus with unspecified complications: Secondary | ICD-10-CM | POA: Diagnosis not present

## 2023-04-10 DIAGNOSIS — K219 Gastro-esophageal reflux disease without esophagitis: Secondary | ICD-10-CM

## 2023-04-10 DIAGNOSIS — E785 Hyperlipidemia, unspecified: Secondary | ICD-10-CM | POA: Diagnosis not present

## 2023-04-10 DIAGNOSIS — F3170 Bipolar disorder, currently in remission, most recent episode unspecified: Secondary | ICD-10-CM | POA: Diagnosis not present

## 2023-04-10 DIAGNOSIS — Z23 Encounter for immunization: Secondary | ICD-10-CM

## 2023-04-10 DIAGNOSIS — Z7984 Long term (current) use of oral hypoglycemic drugs: Secondary | ICD-10-CM

## 2023-04-10 LAB — MICROALBUMIN / CREATININE URINE RATIO
Creatinine,U: 245.9 mg/dL
Microalb Creat Ratio: 0.5 mg/g (ref 0.0–30.0)
Microalb, Ur: 1.3 mg/dL (ref 0.0–1.9)

## 2023-04-10 MED ORDER — TIRZEPATIDE 10 MG/0.5ML ~~LOC~~ SOAJ: 10.0000 mg | SUBCUTANEOUS | 1 refills | Status: DC

## 2023-04-10 MED ORDER — PANTOPRAZOLE SODIUM 40 MG PO TBEC: 40.0000 mg | DELAYED_RELEASE_TABLET | Freq: Every day | ORAL | Status: DC | PRN

## 2023-04-10 NOTE — Assessment & Plan Note (Signed)
BP at goal on lisinopril and toprol xl. Continue same.

## 2023-04-10 NOTE — Assessment & Plan Note (Signed)
LDL at goal. Continue atorvastatin.

## 2023-04-10 NOTE — Assessment & Plan Note (Signed)
A1C at goal.  She notes that her appetite is no longer as suppressed as it was on current dose of mounjaro.  Will increase to 10mg  once weekly.

## 2023-04-10 NOTE — Assessment & Plan Note (Signed)
Stable on pantoprazole. Recommended that she change the dosing to daily prn.

## 2023-04-10 NOTE — Progress Notes (Signed)
Subjective:     Patient ID: Tanya Wilcox, female    DOB: 09-May-1971, 52 y.o.   MRN: [id number]  Chief Complaint  Patient presents with   Follow-up    3 month    HPI  Discussed the use of AI scribe software for clinical note transcription with the patient, who gave verbal consent to proceed.  History of Present Illness         Lab Results  Component Value Date   HGBA1C 6.0 01/08/2023    Wt Readings from Last 3 Encounters:  04/10/23 184 lb 9.6 oz (83.7 kg)  01/08/23 188 lb (85.3 kg)  10/05/22 185 lb 3.2 oz (84 kg)   HTN- maintained on metoprolol xl and lisinopril.  BP Readings from Last 3 Encounters:  04/10/23 128/62  01/18/23 (!) 141/73  01/08/23 136/71    Bipolar disorder-Reports good mood. Continues to follow with psychiatry.   Reports reflux is stable.  She will change protonix to prn.    Lab Results  Component Value Date   CHOL 117 01/08/2023   HDL 50.30 01/08/2023   LDLCALC 53 01/08/2023   TRIG 67.0 01/08/2023   CHOLHDL 2 01/08/2023   On atorvastatin.    Had labs done at her job 8/28 with the following results:  A1C 6.2  Cr .86 Egf 82 Chol 162 Trig 104 Hdl 46 Vldl 19 Ldl 87 Ratio 3.3 glucose     Health Maintenance Due  Topic Date Due   INFLUENZA VACCINE  02/21/2023   COVID-19 Vaccine (5 - 2023-24 season) 03/24/2023   Diabetic kidney evaluation - Urine ACR  04/07/2023    Past Medical History:  Diagnosis Date   Alcohol use    Anxiety    on meds   Bipolar disorder (HCC)    Depression    on meds   Diabetes (HCC)    TYPE 2 ---NO MEDS -- diet controlled   Gestational diabetes    borderline II   Hyperlipidemia    on meds   Hypertension    on meds   Osteoarthritis    RIGHT knee   Tobacco abuse     Past Surgical History:  Procedure Laterality Date   CHOLECYSTECTOMY  2003   GASTRIC ROUX-EN-Y N/A 06/29/2019   Procedure: LAPAROSCOPIC ROUX-EN-Y GASTRIC BYPASS WITH UPPER ENDOSCOPY, ERAS PATHWAY;  Surgeon: Ovidio Kin, MD;   Location: [facility];  Service: General;  Laterality: N/A;   WISDOM TOOTH EXTRACTION      Family History  Problem Relation Age of Onset   Hypertension Mother    Hyperlipidemia Mother    Colon polyps Mother 85   Alcohol abuse Father    Hyperlipidemia Sister    Hypertension Sister    Hypertension Brother    Cancer Brother        multiple myeloma   Retinal detachment Daughter    Diabetes Neg Hx    Colon cancer Neg Hx    Rectal cancer Neg Hx    Stomach cancer Neg Hx    Esophageal cancer Neg Hx     Social History   Socioeconomic History   Marital status: Single    Spouse name: Not on file   Number of children: Not on file   Years of education: Not on file   Highest education level: Not on file  Occupational History   Occupation: BILLING    Employer: [employer]  Tobacco Use   Smoking status: Every Day    Current packs/day: 1.50  Types: Cigarettes   Smokeless tobacco: Never  Vaping Use   Vaping status: Never Used  Substance and Sexual Activity   Alcohol use: Yes    Alcohol/week: 2.0 standard drinks of alcohol    Types: 2 Standard drinks or equivalent per week    Comment: occasionally   Drug use: No    Comment: some marijuana   Sexual activity: Not on file  Other Topics Concern   Not on file  Social History Narrative   Lives with daughter   Works at [employer] and [employer]   Enjoys sleeping   1 dog   Completed high school, some college   Social Determinants of Health   Financial Resource Strain: Not on file  Food Insecurity: Not on file  Transportation Needs: Not on file  Physical Activity: Not on file  Stress: Not on file  Social Connections: Not on file  Intimate Partner Violence: Not on file    Outpatient Medications Prior to Visit  Medication Sig Dispense Refill   ALPRAZolam (XANAX) 0.5 MG tablet Take 0.5 mg by mouth 2 (two) times daily as needed for anxiety.   1   ARIPiprazole (ABILIFY) 5 MG tablet Take 5 mg by mouth daily.   1   atorvastatin  (LIPITOR) 10 MG tablet Take 1 tablet (10 mg total) by mouth daily. 90 tablet 0   Blood Glucose Monitoring Suppl (ONETOUCH VERIO) w/Device KIT USE TO TEST BLOOD SUGAR THREE TIMES DAILY 1 kit 0   furosemide (LASIX) 20 MG tablet Take 1 tablet (20 mg total) by mouth daily as needed. 90 tablet 1   levonorgestrel (MIRENA) 20 MCG/24HR IUD 1 each by Intrauterine route once.     lisinopril (ZESTRIL) 10 MG tablet TAKE 1 TABLET(10 MG) BY MOUTH DAILY 90 tablet 1   metoprolol succinate (TOPROL-XL) 50 MG 24 hr tablet Take 1 tablet (50 mg total) by mouth daily. Take with or immediately following a meal. 90 tablet 1   Multiple Vitamins-Minerals (BARIATRIC MULTIVITAMINS/IRON PO) Take 2 tablets by mouth 2 (two) times daily.     ONETOUCH VERIO test strip CHECK BLOOD SUGAR THREE TIMES DAILY 200 strip 1   pantoprazole (PROTONIX) 40 MG tablet Take 1 tablet (40 mg total) by mouth daily. 90 tablet 0   sertraline (ZOLOFT) 25 MG tablet Take 1 tablet (25 mg total) by mouth daily. 90 tablet 1   valACYclovir (VALTREX) 1000 MG tablet Take 1 tablet (1,000 mg total) by mouth daily. 90 tablet 1   tirzepatide (MOUNJARO) 7.5 MG/0.5ML Pen Inject 7.5 mg into the skin once a week. 6 mL 1   Facility-Administered Medications Prior to Visit  Medication Dose Route Frequency Provider Last Rate Last Admin   0.9 %  sodium chloride infusion  500 mL Intravenous Once Cirigliano, Vito V, DO        Allergies  Allergen Reactions   Trazodone Other (See Comments)    ROS    See HPI Objective:    Physical Exam Constitutional:      General: She is not in acute distress.    Appearance: Normal appearance. She is well-developed.  HENT:     Head: Normocephalic and atraumatic.     Right Ear: External ear normal.     Left Ear: External ear normal.  Eyes:     General: No scleral icterus. Neck:     Thyroid: No thyromegaly.  Cardiovascular:     Rate and Rhythm: Normal rate and regular rhythm.     Heart sounds: Normal heart  sounds. No  murmur heard. Pulmonary:     Effort: Pulmonary effort is normal. No respiratory distress.     Breath sounds: Normal breath sounds. No wheezing.  Musculoskeletal:     Cervical back: Neck supple.  Skin:    General: Skin is warm and dry.  Neurological:     Mental Status: She is alert and oriented to person, place, and time.  Psychiatric:        Mood and Affect: Mood normal.        Behavior: Behavior normal.        Thought Content: Thought content normal.        Judgment: Judgment normal.      BP 128/62   Pulse 66   Temp 98.2 F (36.8 C)   Resp 18   Ht 5\' 7"  (1.702 m)   Wt 184 lb 9.6 oz (83.7 kg)   SpO2 100%   BMI 28.91 kg/m  Wt Readings from Last 3 Encounters:  04/10/23 184 lb 9.6 oz (83.7 kg)  01/08/23 188 lb (85.3 kg)  10/05/22 185 lb 3.2 oz (84 kg)       Assessment & Plan:   Problem List Items Addressed This Visit       Unprioritized   Hyperlipidemia    LDL at goal. Continue atorvastatin.       HTN (hypertension)    BP at goal on lisinopril and toprol xl. Continue same.       Controlled type 2 diabetes mellitus with complication, without long-term current use of insulin (HCC) - Primary    A1C at goal.  She notes that her appetite is no longer as suppressed as it was on current dose of mounjaro.  Will increase to 10mg  once weekly.        Relevant Medications   tirzepatide (MOUNJARO) 10 MG/0.5ML Pen   Other Relevant Orders   Urine Microalbumin w/creat. ratio   Bipolar disorder (HCC)    Stable mood- management per psychiatry.      Flu shot today.  I have discontinued Larena Glassman. Strike's tirzepatide. I am also having her start on tirzepatide. Additionally, I am having her maintain her levonorgestrel, ALPRAZolam, ARIPiprazole, OneTouch Verio, Multiple Vitamins-Minerals (BARIATRIC MULTIVITAMINS/IRON PO), sertraline, OneTouch Verio, valACYclovir, lisinopril, furosemide, metoprolol succinate, pantoprazole, and atorvastatin. We will continue to administer sodium  chloride.  Meds ordered this encounter  Medications   tirzepatide (MOUNJARO) 10 MG/0.5ML Pen    Sig: Inject 10 mg into the skin once a week.    Dispense:  6 mL    Refill:  1    Order Specific Question:   Supervising Provider    Answer:   Danise Edge A [[id number]]

## 2023-04-10 NOTE — Assessment & Plan Note (Signed)
Stable mood- management per psychiatry.

## 2023-04-10 NOTE — Addendum Note (Signed)
Addended by: Maximino Sarin on: 04/10/2023 07:55 AM   Modules accepted: Orders

## 2023-04-10 NOTE — Patient Instructions (Signed)
Please increase your mounjaro to 10mg  once weekly.  Let's aim for a weight goal of 170 lbs.

## 2023-04-22 ENCOUNTER — Ambulatory Visit: Payer: Managed Care, Other (non HMO) | Admitting: Podiatry

## 2023-05-06 LAB — HM PAP SMEAR: HPV, high-risk: NEGATIVE

## 2023-05-06 LAB — HM MAMMOGRAPHY

## 2023-06-12 ENCOUNTER — Encounter: Payer: Self-pay | Admitting: Family

## 2023-06-12 DIAGNOSIS — B379 Candidiasis, unspecified: Secondary | ICD-10-CM

## 2023-06-12 MED ORDER — FLUCONAZOLE 150 MG PO TABS: ORAL_TABLET | ORAL | 0 refills | Status: DC

## 2023-06-26 ENCOUNTER — Other Ambulatory Visit (HOSPITAL_COMMUNITY): Payer: Self-pay

## 2023-06-27 ENCOUNTER — Other Ambulatory Visit: Payer: Self-pay

## 2023-06-27 MED ORDER — ATORVASTATIN CALCIUM 10 MG PO TABS: 10.0000 mg | ORAL_TABLET | Freq: Every day | ORAL | 0 refills | Status: DC

## 2023-07-11 ENCOUNTER — Encounter: Payer: Self-pay | Admitting: *Deleted

## 2023-07-11 ENCOUNTER — Ambulatory Visit (HOSPITAL_BASED_OUTPATIENT_CLINIC_OR_DEPARTMENT_OTHER)
Admission: RE | Admit: 2023-07-11 | Discharge: 2023-07-11 | Disposition: A | Discharge status: Home / Self Care | Payer: Managed Care, Other (non HMO) | Source: Ambulatory Visit | Attending: Nurse Practitioner | Admitting: Nurse Practitioner

## 2023-07-11 ENCOUNTER — Ambulatory Visit
Admission: EM | Admit: 2023-07-11 | Discharge: 2023-07-11 | Disposition: A | Discharge status: Home / Self Care | Payer: Managed Care, Other (non HMO) | Attending: Internal Medicine | Admitting: Internal Medicine

## 2023-07-11 DIAGNOSIS — R062 Wheezing: Secondary | ICD-10-CM | POA: Insufficient documentation

## 2023-07-11 DIAGNOSIS — R051 Acute cough: Secondary | ICD-10-CM | POA: Insufficient documentation

## 2023-07-11 DIAGNOSIS — B349 Viral infection, unspecified: Secondary | ICD-10-CM

## 2023-07-11 LAB — POC COVID19/FLU A&B COMBO
Covid Antigen, POC: NEGATIVE
Influenza A Antigen, POC: NEGATIVE
Influenza B Antigen, POC: NEGATIVE

## 2023-07-11 MED ORDER — PREDNISONE 20 MG PO TABS: 40.0000 mg | ORAL_TABLET | Freq: Every day | ORAL | 0 refills | Status: AC

## 2023-07-11 MED ORDER — BENZONATATE 200 MG PO CAPS: 200.0000 mg | ORAL_CAPSULE | Freq: Three times a day (TID) | ORAL | 0 refills | Status: DC | PRN

## 2023-07-11 MED ORDER — IPRATROPIUM-ALBUTEROL 0.5-2.5 (3) MG/3ML IN SOLN
3.0000 mL | Freq: Once | RESPIRATORY_TRACT | Status: AC
  Administered 2023-07-11: 3 mL via RESPIRATORY_TRACT

## 2023-07-11 MED ORDER — ALBUTEROL SULFATE HFA 108 (90 BASE) MCG/ACT IN AERS: 1.0000 | INHALATION_SPRAY | Freq: Four times a day (QID) | RESPIRATORY_TRACT | 0 refills | Status: DC | PRN

## 2023-07-11 NOTE — ED Triage Notes (Signed)
Pt presents to UC for c/o cough, wheezing, headache, nasal congestion x3 days. Tylenol cold and flu, zycam give no relief.

## 2023-07-11 NOTE — Discharge Instructions (Signed)
The clinic will contact you with results of your chest x-ray once available.  Start albuterol inhaler as needed for wheezing.  Prednisone daily for 5 days.  Tessalon as needed for your cough.  Lots of rest and fluids.  Please follow-up with your PCP in 2 days for recheck.  Please go to the ER for any worsening symptoms.  I hope you feel better soon!

## 2023-07-11 NOTE — Telephone Encounter (Signed)
Patient was scheduled for 08/07/23

## 2023-07-11 NOTE — ED Provider Notes (Addendum)
[facility]    CSN: [id number] Arrival date & time: 07/11/23  1237      History   Chief Complaint Chief Complaint  Patient presents with   Cough   Wheezing    HPI Tanya Wilcox is a 52 y.o. female  presents for evaluation of URI symptoms for 3 days. Patient reports associated symptoms of cough, congestion, headache, wheezing, malaise.  Reports 1 set of posttussive vomiting.  Denies diarrhea, ear pain, fevers, sore throat, body aches, shortness of breath. Patient does not have a hx of asthma. Patient is not active smoker.  Reports sick contacts via her sister-in-law.  Pt has taken Zicam and Tylenol Cold and flu OTC for symptoms. Pt has no other concerns at this time.    Cough Associated symptoms: headaches and wheezing   Wheezing Associated symptoms: cough, fatigue and headaches     Past Medical History:  Diagnosis Date   Alcohol use    Anxiety    on meds   Bipolar disorder (HCC)    Depression    on meds   Diabetes (HCC)    TYPE 2 ---NO MEDS -- diet controlled   Gestational diabetes    borderline II   Hyperlipidemia    on meds   Hypertension    on meds   Osteoarthritis    RIGHT knee   Tobacco abuse     Patient Active Problem List   Diagnosis Date Noted   Callus 01/18/2023   Hav (hallux abducto valgus), unspecified laterality 01/18/2023   Plantar flexed metatarsal bone of left foot 01/18/2023   Lower extremity edema 01/08/2023   Controlled type 2 diabetes mellitus with complication, without long-term current use of insulin (HCC) 07/25/2022   Cervical radiculopathy 11/17/2021   Immunization counseling 08/09/2021   Raynaud's disease without gangrene 08/09/2021   Eating disorder 05/09/2021   Preventative health care 11/09/2020   Diabetic peripheral neuropathy (HCC) 06/14/2020   Shoulder impingement syndrome, left 12/22/2019   Overweight with body mass index (BMI) of 29 to 29.9 in adult 06/29/2019   GERD (gastroesophageal reflux disease) 01/23/2016    Hearing loss 12/07/2014   HTN (hypertension) 12/30/2013   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      Home Medications    Prior to Admission medications   Medication Sig Start Date End Date Taking? Authorizing Provider  albuterol (VENTOLIN HFA) 108 (90 Base) MCG/ACT inhaler Inhale 1-2 puffs into the lungs every 6 (six) hours as needed for wheezing or shortness of breath. 07/11/23  Yes Radford Pax, NP  benzonatate (TESSALON) 200 MG capsule Take 1 capsule (200 mg total) by mouth 3 (three) times daily as needed. 07/11/23  Yes Radford Pax, NP  predniSONE (DELTASONE) 20 MG tablet Take 2 tablets (40 mg total) by mouth daily with breakfast for 5 days. 07/11/23 07/16/23 Yes Radford Pax, NP  ALPRAZolam Prudy Feeler) 0.5 MG tablet Take 0.5  mg by mouth 2 (two) times daily as needed for anxiety.  08/14/16   [provider]  ARIPiprazole (ABILIFY) 5 MG tablet Take 5 mg by mouth daily.  09/11/16   [provider]  atorvastatin (LIPITOR) 10 MG tablet Take 1 tablet (10 mg total) by mouth daily. 06/27/23   Sandford Craze, NP  Blood Glucose Monitoring Suppl (ONETOUCH VERIO) w/Device KIT USE TO TEST BLOOD SUGAR THREE TIMES DAILY 05/05/19   Reather Littler, MD  fluconazole (DIFLUCAN) 150 MG tablet Take 1 tab by mouth today. May repeat in 3 days if symptoms do not improe. 06/12/23   Sandford Craze, NP  furosemide (LASIX) 20 MG  tablet Take 1 tablet (20 mg total) by mouth daily as needed. 10/05/22   Sandford Craze, NP  levonorgestrel (MIRENA) 20 MCG/24HR IUD 1 each by Intrauterine route once. 06/24/16   [provider]  lisinopril (ZESTRIL) 10 MG tablet TAKE 1 TABLET(10 MG) BY MOUTH DAILY 10/05/22   Sandford Craze, NP  metoprolol succinate (TOPROL-XL) 50 MG 24 hr tablet Take 1 tablet (50 mg total) by mouth daily. Take with or immediately following a meal. 01/08/23   Sandford Craze, NP  Multiple Vitamins-Minerals (BARIATRIC MULTIVITAMINS/IRON PO) Take 2 tablets by mouth 2 (two) times daily.    [provider]  [facility] VERIO test strip CHECK BLOOD SUGAR THREE TIMES DAILY 06/05/21   Reather Littler, MD  pantoprazole (PROTONIX) 40 MG tablet Take 1 tablet (40 mg total) by mouth daily as needed. 04/10/23   Sandford Craze, NP  sertraline (ZOLOFT) 25 MG tablet Take 1 tablet (25 mg total) by mouth daily. 12/23/20   Sandford Craze, NP  tirzepatide [facility]) 10 MG/0.5ML Pen Inject 10 mg into the skin once a week. 04/10/23   Sandford Craze, NP  valACYclovir (VALTREX) 1000 MG tablet Take 1 tablet (1,000 mg total) by mouth daily. 10/05/22   Sandford Craze, NP    Family History Family History  Problem Relation Age of Onset   Hypertension Mother    Hyperlipidemia Mother    Colon polyps Mother 58   Alcohol abuse Father    Hyperlipidemia Sister    Hypertension Sister    Hypertension Brother    Cancer Brother        multiple myeloma   Retinal detachment Daughter    Diabetes Neg Hx    Colon cancer Neg Hx    Rectal cancer Neg Hx    Stomach cancer Neg Hx    Esophageal cancer Neg Hx     Social History Social History   Tobacco Use   Smoking status: Every Day    Current packs/day: 1.50    Types: Cigarettes   Smokeless tobacco: Never  Vaping Use   Vaping status: Never Used  Substance Use Topics   Alcohol use: Yes    Alcohol/week: 2.0 standard drinks of alcohol    Types: 2 Standard  drinks or equivalent per week    Comment: occasionally   Drug use: No    Comment: some marijuana     Allergies   Trazodone   Review of Systems Review of Systems  Constitutional:  Positive for fatigue.  HENT:  Positive for congestion.   Respiratory:  Positive for cough and wheezing.   Neurological:  Positive for headaches.     Physical Exam Triage Vital Signs ED Triage Vitals  Encounter Vitals Group     BP 07/11/23 1250 (!) 161/92     Systolic BP Percentile --      Diastolic BP  Percentile --      Pulse Rate 07/11/23 1250 65     Resp 07/11/23 1250 20     Temp 07/11/23 1250 98.7 F (37.1 C)     Temp Source 07/11/23 1250 Oral     SpO2 07/11/23 1250 98 %     Weight --      Height --      Head Circumference --      Peak Flow --      Pain Score 07/11/23 1248 3     Pain Loc --      Pain Education --      Exclude from Growth Chart --    No data found.  Updated Vital Signs BP (!) 161/92 (BP Location: Right Arm)   Pulse 65   Temp 98.7 F (37.1 C) (Oral)   Resp 20   SpO2 98%   Visual Acuity Right Eye Distance:   Left Eye Distance:   Bilateral Distance:    Right Eye Near:   Left Eye Near:    Bilateral Near:     Physical Exam Vitals and nursing note reviewed.  Constitutional:      General: She is not in acute distress.    Appearance: She is well-developed. She is not ill-appearing.  HENT:     Head: Normocephalic and atraumatic.     Right Ear: Tympanic membrane and ear canal normal.     Left Ear: Tympanic membrane and ear canal normal.     Nose: Congestion present.     Mouth/Throat:     Mouth: Mucous membranes are moist.     Pharynx: Oropharynx is clear. Uvula midline. No oropharyngeal exudate or posterior oropharyngeal erythema.     Tonsils: No tonsillar exudate or tonsillar abscesses.  Eyes:     Conjunctiva/sclera: Conjunctivae normal.     Pupils: Pupils are equal, round, and reactive to light.  Cardiovascular:     Rate and Rhythm: Normal rate and  regular rhythm.     Heart sounds: Normal heart sounds.  Pulmonary:     Effort: Pulmonary effort is normal.     Breath sounds: Wheezing present.     Comments: Expiratory wheezing all bases Musculoskeletal:     Cervical back: Normal range of motion and neck supple.  Lymphadenopathy:     Cervical: No cervical adenopathy.  Skin:    General: Skin is warm and dry.  Neurological:     General: No focal deficit present.     Mental Status: She is alert and oriented to person, place, and time.  Psychiatric:        Mood and Affect: Mood normal.        Behavior: Behavior normal.      UC Treatments / Results  Labs (all labs ordered are listed, but only abnormal results are displayed) Labs Reviewed  POC COVID19/FLU A&B COMBO    EKG   Radiology No results found.  Procedures Procedures (including critical care time)  Medications Ordered in UC Medications  ipratropium-albuterol (DUONEB) 0.5-2.5 (3) MG/3ML nebulizer solution 3 mL (3 mLs Nebulization Given 07/11/23 1308)    Initial Impression / Assessment and Plan / UC Course  I have reviewed the triage vital signs and the nursing notes.  Pertinent labs & imaging results that were available during my care of the patient were reviewed by me and considered in my medical decision making (see chart for details).     Reviewed exam and symptoms with patient.  No red flags.  Negative rapid  flu and COVID testing.  Nebulizer given in clinic with minimal improvement in wheezing.  Given her history and presentation we will order chest x-ray and will contact patient when these results are available.  No radiology available onsite at time of evaluation.  Will do albuterol inhaler, Tessalon and prednisone.  PCP follow-up 2 days for recheck.  ER precautions reviewed.  Addendum 1751:.  Chest x-ray negative for pneumonia.  No change in plan of care. Final Clinical Impressions(s) / UC Diagnoses   Final diagnoses:  Acute cough  Wheezing  Viral  illness     Discharge Instructions      The clinic will contact you with results of your chest x-ray once available.  Start albuterol inhaler as needed for wheezing.  Prednisone daily for 5 days.  Tessalon as needed for your cough.  Lots of rest and fluids.  Please follow-up with your PCP in 2 days for recheck.  Please go to the ER for any worsening symptoms.  I hope you feel better soon!     ED Prescriptions     Medication Sig Dispense Auth. Provider   albuterol (VENTOLIN HFA) 108 (90 Base) MCG/ACT inhaler Inhale 1-2 puffs into the lungs every 6 (six) hours as needed for wheezing or shortness of breath. 1 each Radford Pax, NP   predniSONE (DELTASONE) 20 MG tablet Take 2 tablets (40 mg total) by mouth daily with breakfast for 5 days. 10 tablet Radford Pax, NP   benzonatate (TESSALON) 200 MG capsule Take 1 capsule (200 mg total) by mouth 3 (three) times daily as needed. 20 capsule Radford Pax, NP      PDMP not reviewed this encounter.   Radford Pax, NP 07/11/23 1339    Radford Pax, NP 07/11/23 [phone]

## 2023-07-25 ENCOUNTER — Other Ambulatory Visit: Payer: Self-pay | Admitting: Family

## 2023-07-25 DIAGNOSIS — I10 Essential (primary) hypertension: Secondary | ICD-10-CM

## 2023-08-01 ENCOUNTER — Encounter: Payer: Self-pay | Admitting: Family

## 2023-08-01 DIAGNOSIS — A6000 Herpesviral infection of urogenital system, unspecified: Secondary | ICD-10-CM

## 2023-08-01 MED ORDER — VALACYCLOVIR HCL 1 G PO TABS: 1000.0000 mg | ORAL_TABLET | Freq: Every day | ORAL | 1 refills | Status: DC

## 2023-08-07 ENCOUNTER — Telehealth: Payer: Self-pay | Admitting: Family

## 2023-08-07 ENCOUNTER — Ambulatory Visit: Payer: Managed Care, Other (non HMO) | Admitting: Family

## 2023-08-07 VITALS — BP 150/68 | HR 63 | Temp 97.9°F | Resp 16 | Ht 67.0 in | Wt 185.0 lb

## 2023-08-07 DIAGNOSIS — R1013 Epigastric pain: Secondary | ICD-10-CM

## 2023-08-07 DIAGNOSIS — E118 Type 2 diabetes mellitus with unspecified complications: Secondary | ICD-10-CM

## 2023-08-07 DIAGNOSIS — I10 Essential (primary) hypertension: Secondary | ICD-10-CM | POA: Diagnosis not present

## 2023-08-07 DIAGNOSIS — E785 Hyperlipidemia, unspecified: Secondary | ICD-10-CM

## 2023-08-07 DIAGNOSIS — F32A Depression, unspecified: Secondary | ICD-10-CM

## 2023-08-07 DIAGNOSIS — A6000 Herpesviral infection of urogenital system, unspecified: Secondary | ICD-10-CM

## 2023-08-07 DIAGNOSIS — F419 Anxiety disorder, unspecified: Secondary | ICD-10-CM

## 2023-08-07 DIAGNOSIS — Z720 Tobacco use: Secondary | ICD-10-CM

## 2023-08-07 LAB — BASIC METABOLIC PANEL
BUN: 13 mg/dL (ref 6–23)
CO2: 26 meq/L (ref 19–32)
Calcium: 9.3 mg/dL (ref 8.4–10.5)
Chloride: 105 meq/L (ref 96–112)
Creatinine, Ser: 0.88 mg/dL (ref 0.40–1.20)
GFR: 75.58 mL/min (ref >60.00)
Glucose, Bld: 78 mg/dL (ref 70–99)
Potassium: 4.4 meq/L (ref 3.5–5.1)
Sodium: 140 meq/L (ref 135–145)

## 2023-08-07 LAB — HEMOGLOBIN A1C: Hgb A1c MFr Bld: 5.6 % (ref 4.6–6.5)

## 2023-08-07 MED ORDER — PANTOPRAZOLE SODIUM 40 MG PO TBEC: 40.0000 mg | DELAYED_RELEASE_TABLET | Freq: Every day | ORAL | 1 refills | Status: DC | PRN

## 2023-08-07 MED ORDER — VARENICLINE TARTRATE (STARTER) 0.5 MG X 11 & 1 MG X 42 PO TBPK: ORAL_TABLET | ORAL | 0 refills | Status: DC

## 2023-08-07 MED ORDER — AMLODIPINE BESYLATE 5 MG PO TABS: 5.0000 mg | ORAL_TABLET | Freq: Every day | ORAL | 1 refills | Status: DC

## 2023-08-07 NOTE — Assessment & Plan Note (Signed)
 Stable on daily valtrex  suppressive therapy. Continue same.

## 2023-08-07 NOTE — Assessment & Plan Note (Signed)
 Update A1c, continue mounjaro.

## 2023-08-07 NOTE — Telephone Encounter (Signed)
 Electronic request made

## 2023-08-07 NOTE — Progress Notes (Signed)
 Subjective:     Patient ID: Tanya Wilcox, female    DOB: Jun 27, 1971, 53 y.o.   MRN: [id number]  Chief Complaint  Patient presents with   Hypertension    Here for follow up   Diabetes    Here for follow up    Hypertension  Diabetes    Discussed the use of AI scribe software for clinical note transcription with the patient, who gave verbal consent to proceed.  History of Present Illness   The patient, with a history of gastric bypass and currently on Mounjaro  10mg , presents with stomach discomfort and decreased appetite. She describes the discomfort as sometimes high in the stomach, but not always, and sometimes it feels like the whole stomach is involved. She also reports a decreased desire to eat, stating that she often has to force herself to take a few bites of food. She notes that she has not lost any weight despite not eating a lot, often only eating twice a day. She also reports a history of nausea with Mounjaro , but states that the nausea would go away. She is considering decreasing the Mounjaro  if the discomfort continues.  The patient also reports that her blood pressure has been elevated, both today and at her last check in December. She is currently on Toprol  XL 50mg  and Lisinopril  10mg  for blood pressure management. She continues to smoke a pack a day and is considering quitting, having previously tried Chantix  with some success in decreasing tobacco cravings. She is considering restarting Chantix  in the new year.  The patient also reports needing a refill of her acid reflux medication, pantoprazole . She takes Valtrex  daily and has not had any breakouts. She also reports having been sick for three weeks around Christmas, with a lingering cough.      Wt Readings from Last 3 Encounters:  08/07/23 185 lb (83.9 kg)  04/10/23 184 lb 9.6 oz (83.7 kg)  01/08/23 188 lb (85.3 kg)     Lab Results  Component Value Date   HGBA1C 6.0 01/08/2023   HGBA1C 5.8 (H) 07/25/2022    HGBA1C 6.5 04/06/2022   Lab Results  Component Value Date   MICROALBUR 1.3 04/10/2023   LDLCALC 53 01/08/2023   CREATININE 0.91 01/23/2023     Health Maintenance Due  Topic Date Due   COVID-19 Vaccine (5 - 2024-25 season) 03/24/2023   MAMMOGRAM  07/20/2023   HEMOGLOBIN A1C  07/10/2023    Past Medical History:  Diagnosis Date   Alcohol use    Anxiety    on meds   Bipolar disorder (HCC)    Depression    on meds   Diabetes (HCC)    TYPE 2 ---NO MEDS -- diet controlled   Gestational diabetes    borderline II   Hyperlipidemia    on meds   Hypertension    on meds   Osteoarthritis    RIGHT knee   Tobacco abuse     Past Surgical History:  Procedure Laterality Date   CHOLECYSTECTOMY  2003   GASTRIC ROUX-EN-Y N/A 06/29/2019   Procedure: LAPAROSCOPIC ROUX-EN-Y GASTRIC BYPASS WITH UPPER ENDOSCOPY, ERAS PATHWAY;  Surgeon: Juanita Norlander, MD;  Location: [facility];  Service: General;  Laterality: N/A;   WISDOM TOOTH EXTRACTION      Family History  Problem Relation Age of Onset   Hypertension Mother    Hyperlipidemia Mother    Colon polyps Mother 51   Alcohol abuse Father    Hyperlipidemia Sister  Hypertension Sister    Hypertension Brother    Cancer Brother        multiple myeloma   Retinal detachment Daughter    Diabetes Neg Hx    Colon cancer Neg Hx    Rectal cancer Neg Hx    Stomach cancer Neg Hx    Esophageal cancer Neg Hx     Social History   Socioeconomic History   Marital status: Single    Spouse name: Not on file   Number of children: Not on file   Years of education: Not on file   Highest education level: 12th grade  Occupational History   Occupation: BILLING    Employer: [employer]  Tobacco Use   Smoking status: Every Day    Current packs/day: 1.50    Types: Cigarettes   Smokeless tobacco: Never  Vaping Use   Vaping status: Never Used  Substance and Sexual Activity   Alcohol use: Yes    Alcohol/week: 2.0 standard drinks of alcohol    Types:  2 Standard drinks or equivalent per week    Comment: occasionally   Drug use: No    Comment: some marijuana   Sexual activity: Not on file  Other Topics Concern   Not on file  Social History Narrative   Lives with daughter   Works at [employer] and [employer]   Enjoys sleeping   1 dog   Completed high school, some college   Social Drivers of Health   Financial Resource Strain: Medium Risk (08/07/2023)   Overall Financial Resource Strain (CARDIA)    Difficulty of Paying Living Expenses: Somewhat hard  Food Insecurity: Food Insecurity Present (08/07/2023)   Hunger Vital Sign    Worried About Running Out of Food in the Last Year: Sometimes true    Ran Out of Food in the Last Year: Never true  Transportation Needs: No Transportation Needs (08/07/2023)   PRAPARE - Administrator, Civil Service (Medical): No    Lack of Transportation (Non-Medical): No  Physical Activity: Unknown (08/07/2023)   Exercise Vital Sign    Days of Exercise per Week: 0 days    Minutes of Exercise per Session: Not on file  Stress: Stress Concern Present (08/07/2023)   [employer] of Occupational Health - Occupational Stress Questionnaire    Feeling of Stress : Very much  Social Connections: Moderately Integrated (08/07/2023)   Social Connection and Isolation Panel [NHANES]    Frequency of Communication with Friends and Family: Three times a week    Frequency of Social Gatherings with Friends and Family: Once a week    Attends Religious Services: 1 to 4 times per year    Active Member of [employer] or Organizations: Yes    Attends Engineer, structural: More than 4 times per year    Marital Status: Never married  Intimate Partner Violence: Not on file    Outpatient Medications Prior to Visit  Medication Sig Dispense Refill   albuterol  (VENTOLIN  HFA) 108 (90 Base) MCG/ACT inhaler Inhale 1-2 puffs into the lungs every 6 (six) hours as needed for wheezing or shortness of breath. 1 each 0    ALPRAZolam  (XANAX ) 0.5 MG tablet Take 0.5 mg by mouth 2 (two) times daily as needed for anxiety.   1   ARIPiprazole (ABILIFY) 5 MG tablet Take 5 mg by mouth daily.   1   atorvastatin  (LIPITOR) 10 MG tablet Take 1 tablet (10 mg total) by mouth daily. 90 tablet 0  benzonatate  (TESSALON ) 200 MG capsule Take 1 capsule (200 mg total) by mouth 3 (three) times daily as needed. 20 capsule 0   Blood Glucose Monitoring Suppl (ONETOUCH VERIO) w/Device KIT USE TO TEST BLOOD SUGAR THREE TIMES DAILY 1 kit 0   fluconazole  (DIFLUCAN ) 150 MG tablet Take 1 tab by mouth today. May repeat in 3 days if symptoms do not improe. 2 tablet 0   furosemide  (LASIX ) 20 MG tablet Take 1 tablet (20 mg total) by mouth daily as needed. 90 tablet 1   levonorgestrel (MIRENA) 20 MCG/24HR IUD 1 each by Intrauterine route once.     lisinopril  (ZESTRIL ) 10 MG tablet TAKE 1 TABLET(10 MG) BY MOUTH DAILY 90 tablet 1   metoprolol  succinate (TOPROL -XL) 50 MG 24 hr tablet Take 1 tablet (50 mg total) by mouth daily. Take with or immediately following a meal 90 tablet 0   Multiple Vitamins-Minerals (BARIATRIC MULTIVITAMINS/IRON PO) Take 2 tablets by mouth 2 (two) times daily.     ONETOUCH VERIO test strip CHECK BLOOD SUGAR THREE TIMES DAILY 200 strip 1   sertraline  (ZOLOFT ) 25 MG tablet Take 1 tablet (25 mg total) by mouth daily. 90 tablet 1   tirzepatide  (MOUNJARO ) 10 MG/0.5ML Pen Inject 10 mg into the skin once a week. 6 mL 1   valACYclovir  (VALTREX ) 1000 MG tablet Take 1 tablet (1,000 mg total) by mouth daily. 90 tablet 1   pantoprazole  (PROTONIX ) 40 MG tablet Take 1 tablet (40 mg total) by mouth daily as needed.     No facility-administered medications prior to visit.    Allergies  Allergen Reactions   Trazodone Other (See Comments)    ROS See HPI    Objective:    Physical Exam Constitutional:      General: She is not in acute distress.    Appearance: Normal appearance. She is well-developed.  HENT:     Head: Normocephalic  and atraumatic.     Right Ear: External ear normal.     Left Ear: External ear normal.  Eyes:     General: No scleral icterus. Neck:     Thyroid : No thyromegaly.  Cardiovascular:     Rate and Rhythm: Normal rate and regular rhythm.     Heart sounds: Normal heart sounds. No murmur heard. Pulmonary:     Effort: Pulmonary effort is normal. No respiratory distress.     Breath sounds: Normal breath sounds. No wheezing.  Musculoskeletal:     Cervical back: Neck supple.  Skin:    General: Skin is warm and dry.  Neurological:     Mental Status: She is alert and oriented to person, place, and time.  Psychiatric:        Mood and Affect: Mood normal.        Behavior: Behavior normal.        Thought Content: Thought content normal.        Judgment: Judgment normal.      BP (!) 150/68   Pulse 63   Temp 97.9 F (36.6 C) (Oral)   Resp 16   Ht 5\' 7"  (1.702 m)   Wt 185 lb (83.9 kg)   SpO2 100%   BMI 28.98 kg/m  Wt Readings from Last 3 Encounters:  08/07/23 185 lb (83.9 kg)  04/10/23 184 lb 9.6 oz (83.7 kg)  01/08/23 188 lb (85.3 kg)       Assessment & Plan:   Problem List Items Addressed This Visit       Unprioritized  Tobacco abuse   Smoking 1PPD. Trial of chantix . Common side effects including rare risk of suicide ideation was discussed with the patient today.  Patient is instructed to go directly to the ED if this occurs.  We discussed that patient can continue to smoke for 1 week after starting chantix , but then must discontinue cigarettes.  He is also instructed to contact us  prior to completion of the starter month pack for an rx for the continuation month pack.  5 minutes spent with patient today on tobacco cessation counseling.        Hyperlipidemia   Lab Results  Component Value Date   CHOL 117 01/08/2023   HDL 50.30 01/08/2023   LDLCALC 53 01/08/2023   TRIG 67.0 01/08/2023   CHOLHDL 2 01/08/2023  At goal on lipitor, continue same.       Relevant  Medications   amLODipine  (NORVASC ) 5 MG tablet   HTN (hypertension) - Primary   BP Readings from Last 3 Encounters:  08/07/23 (!) 152/68  07/11/23 (!) 161/92  04/10/23 128/62   Uncontrolled despite lisinopril /metoprol. Will add amlodipine   5mg  once daily.       Relevant Medications   amLODipine  (NORVASC ) 5 MG tablet   Genital HSV   Stable on daily valtrex  suppressive therapy. Continue same.       Epigastric pain   Post prandial. Advised pt that it is most likely side effect of the mounjaro . She would like to continue the moun      Controlled type 2 diabetes mellitus with complication, without long-term current use of insulin  (HCC)   Update A1c, continue mounjaro .       Relevant Orders   HgB A1c   Basic Metabolic Panel (BMET)   Anxiety and depression   Stable, being managed by her psychiatry- David Escort.        I am having Wenceslao Haller. Hankin start on Varenicline  Tartrate (Starter) and amLODipine . I am also having her maintain her levonorgestrel, ALPRAZolam , ARIPiprazole, OneTouch Verio, Multiple Vitamins-Minerals (BARIATRIC MULTIVITAMINS/IRON PO), sertraline , OneTouch Verio, lisinopril , furosemide , tirzepatide , fluconazole , atorvastatin , albuterol , benzonatate , metoprolol  succinate, valACYclovir , and pantoprazole .  Meds ordered this encounter  Medications   Varenicline  Tartrate, Starter, (CHANTIX  STARTING MONTH PAK) 0.5 MG X 11 & 1 MG X 42 TBPK    Sig: Take one 0.5 mg tablet by mouth once daily for 3 days, then increase to one 0.5 mg tablet twice daily for 4 days, then increase to one 1 mg tablet twice daily.    Dispense:  53 each    Refill:  0    Supervising Provider:   Randie Bustle A [[id number]]   pantoprazole  (PROTONIX ) 40 MG tablet    Sig: Take 1 tablet (40 mg total) by mouth daily as needed.    Dispense:  90 tablet    Refill:  1    Supervising Provider:   Randie Bustle A [[id number]]   amLODipine  (NORVASC ) 5 MG tablet    Sig: Take 1 tablet (5 mg total) by mouth daily.     Dispense:  90 tablet    Refill:  1    Supervising Provider:   Randie Bustle A [[id number]]

## 2023-08-07 NOTE — Telephone Encounter (Signed)
 Please call [facility] and request a copy of mammogram report.

## 2023-08-07 NOTE — Assessment & Plan Note (Addendum)
 BP Readings from Last 3 Encounters:  08/07/23 (!) 152/68  07/11/23 (!) 161/92  04/10/23 128/62   Uncontrolled despite lisinopril /metoprol. Will add amlodipine   5mg  once daily.

## 2023-08-07 NOTE — Assessment & Plan Note (Signed)
 Post prandial. Advised pt that it is most likely side effect of the mounjaro . She would like to continue the moun

## 2023-08-07 NOTE — Assessment & Plan Note (Signed)
 Stable, being managed by her psychiatry- David Escort.

## 2023-08-07 NOTE — Assessment & Plan Note (Addendum)
 Smoking 1PPD. Trial of chantix . Common side effects including rare risk of suicide ideation was discussed with the patient today.  Patient is instructed to go directly to the ED if this occurs.  We discussed that patient can continue to smoke for 1 week after starting chantix , but then must discontinue cigarettes.  He is also instructed to contact us  prior to completion of the starter month pack for an rx for the continuation month pack.  5 minutes spent with patient today on tobacco cessation counseling.

## 2023-08-07 NOTE — Patient Instructions (Signed)
 VISIT SUMMARY:  During today's visit, we discussed your stomach discomfort and decreased appetite, elevated blood pressure, smoking habits, and need for a refill of your acid reflux medication. We also reviewed your general health maintenance, including checking your A1C and kidney function, and obtaining recent mammogram results.  YOUR PLAN:  -GASTROINTESTINAL DISCOMFORT AND DECREASED APPETITE: Your stomach discomfort and decreased appetite may be related to your current medication, Mounjaro . We will continue with the current dose of Mounjaro  10mg  daily, but if your symptoms persist or worsen, we may consider reducing the dose. Please monitor your symptoms closely.  -HYPERTENSION: Your blood pressure has been elevated. Hypertension means high blood pressure, which can lead to serious health issues if not managed properly. We will add a medication called amlodipine  once daily to help with your blood pressure.  -TOBACCO USE: You are currently smoking one pack per day. Smoking can have many negative health effects. We discussed the benefits of quitting and decided to start you on Chantix  beginning July 24, 2023. We will check in before the end of the first month to see how you are doing and to determine if you need additional prescriptions.  -ACID REFLUX: You need a refill of your acid reflux medication, Pantoprazole . Acid reflux occurs when stomach acid frequently flows back into the tube connecting your mouth and stomach. We have sent a 90-day refill to [facility] for you.  -GENERAL HEALTH MAINTENANCE: We will check your A1C and kidney function today to monitor your overall health. Additionally, we will obtain your recent mammogram results from [facility]. Please follow up in 3 months to assess your stomach issues and progress with Chantix .  INSTRUCTIONS:  Please follow up in 1 months to assess your stomach issues and progress with Chantix . Additionally, we will recheck your blood pressure  today and may adjust your medication if it remains elevated. Start Chantix  on July 24, 2023, and check in before the end of the first month to assess your progress.

## 2023-08-07 NOTE — Assessment & Plan Note (Signed)
 Lab Results  Component Value Date   CHOL 117 01/08/2023   HDL 50.30 01/08/2023   LDLCALC 53 01/08/2023   TRIG 67.0 01/08/2023   CHOLHDL 2 01/08/2023  At goal on lipitor, continue same.

## 2023-08-08 ENCOUNTER — Encounter: Payer: Self-pay | Admitting: Family

## 2023-08-09 ENCOUNTER — Other Ambulatory Visit: Payer: Self-pay | Admitting: Family

## 2023-08-12 ENCOUNTER — Encounter: Payer: Self-pay | Admitting: Family

## 2023-08-12 MED ORDER — FUROSEMIDE 20 MG PO TABS: 20.0000 mg | ORAL_TABLET | Freq: Every day | ORAL | 0 refills | Status: DC | PRN

## 2023-09-10 ENCOUNTER — Ambulatory Visit: Payer: Managed Care, Other (non HMO) | Admitting: Family

## 2023-09-21 ENCOUNTER — Other Ambulatory Visit: Payer: Self-pay | Admitting: Family

## 2023-10-06 ENCOUNTER — Other Ambulatory Visit: Payer: Self-pay | Admitting: Family

## 2023-10-06 DIAGNOSIS — I10 Essential (primary) hypertension: Secondary | ICD-10-CM

## 2023-10-15 ENCOUNTER — Encounter: Payer: Self-pay | Admitting: Family

## 2023-10-15 DIAGNOSIS — I10 Essential (primary) hypertension: Secondary | ICD-10-CM

## 2023-10-15 MED ORDER — LISINOPRIL 10 MG PO TABS: ORAL_TABLET | ORAL | 1 refills | Status: DC

## 2023-10-18 ENCOUNTER — Telehealth: Payer: Self-pay | Admitting: Family

## 2023-10-18 NOTE — Telephone Encounter (Signed)
 I saw that she was requesting a colonoscopy.  My records show that this is not due until 2032.  Is she having a new GI concern?

## 2023-10-18 NOTE — Telephone Encounter (Signed)
 Called patient but no answer.

## 2023-10-21 ENCOUNTER — Telehealth: Payer: Self-pay | Admitting: Gastroenterology

## 2023-10-21 NOTE — Telephone Encounter (Signed)
 Patient reports she was told by GI she needed to go back. She is not sure when, she will contact them to confirm. Patient advised to call us back if she needs anything from [place]

## 2023-10-21 NOTE — Telephone Encounter (Signed)
 Inbound call from patient requesting a call to discuss scheduling colonoscopy at [facility]. Patient was originally scheduled for 2022 but had to cancel. Please advise, thank you.

## 2023-10-21 NOTE — Telephone Encounter (Signed)
 Patient had a 25 mm polyp removed at the time of 09/2020 colonoscopy. Repeat colonoscopy in 6 months for surveillance after piecemeal polypectomy at [facility] was recommended. Patient was scheduled in 06/2021 but had to cancel due to cost of procedure. Patient would like to reschedule at this time. Does she need an OV or can we proceed with scheduling for your next available hospital slot?

## 2023-10-23 ENCOUNTER — Other Ambulatory Visit: Payer: Self-pay

## 2023-10-23 DIAGNOSIS — Z860100 Personal history of colon polyps, unspecified: Secondary | ICD-10-CM

## 2023-10-23 NOTE — Telephone Encounter (Signed)
 Called and spoke with patient. Patient has been scheduled for a telephone PV on Tuesday, 12/03/23 at 9 am. Colonoscopy at [facility] on 12/17/23 at 9:45 am, arriving at 8:15 am. Patient verbalized understanding and had no concerns at the end of the call.

## 2023-10-24 LAB — HM DIABETES EYE EXAM

## 2023-10-25 ENCOUNTER — Telehealth: Payer: Self-pay | Admitting: Family

## 2023-10-25 ENCOUNTER — Ambulatory Visit: Admitting: Family

## 2023-10-25 VITALS — BP 134/79 | HR 70 | Temp 98.9°F | Resp 16 | Ht 67.0 in | Wt 175.0 lb

## 2023-10-25 DIAGNOSIS — E118 Type 2 diabetes mellitus with unspecified complications: Secondary | ICD-10-CM | POA: Diagnosis not present

## 2023-10-25 DIAGNOSIS — I10 Essential (primary) hypertension: Secondary | ICD-10-CM

## 2023-10-25 DIAGNOSIS — Z720 Tobacco use: Secondary | ICD-10-CM | POA: Diagnosis not present

## 2023-10-25 DIAGNOSIS — F1010 Alcohol abuse, uncomplicated: Secondary | ICD-10-CM

## 2023-10-25 DIAGNOSIS — F3170 Bipolar disorder, currently in remission, most recent episode unspecified: Secondary | ICD-10-CM

## 2023-10-25 DIAGNOSIS — Z7985 Long-term (current) use of injectable non-insulin antidiabetic drugs: Secondary | ICD-10-CM

## 2023-10-25 NOTE — Progress Notes (Unsigned)
 Established Patient Office Visit  Subjective   Patient ID: Tanya Wilcox, female    DOB: 1971-07-19  Age: 53 y.o. MRN: [id number]  Chief Complaint  Patient presents with   Hypertension    Here for follow up   Depression    Here for follow up   Anxiety    Here for follow up   Insomnia    Patient reports having trouble staying sleep    Patient presents today for hypertension follow-up. Patient reports that she is tolerating amlodipine well. Blood pressure better today. She reports that she has experienced increased stress lately due to personal factors.   Also, reports that she has experienced worsening depression lately as she deals with her sister's new diagnosis of breast cancer and her brother having multiple myeloma. She reports that she is compliant with her medications. She states that her normal coping mechanisms do not seem to be working as well right now. She reports that she sees a Economist and psychiatrist regularly and will be making an appt with them on Monday. She denies SI/HI. States she will go to the ED if she develops SI/HI.  Patient states that she has declined LDCT screening for lung cancer in the past; however, she would like to now have that done.     Hypertension Associated symptoms include anxiety.  Depression        Past medical history includes anxiety.   Anxiety    Insomnia PMH includes: depression.     Review of Systems  Psychiatric/Behavioral:  Positive for depression.   See HPI   Objective:     BP 134/79 (BP Location: Right Arm, Patient Position: Sitting)   Pulse 70   Temp 98.9 F (37.2 C) (Oral)   Resp 16   Ht 5\' 7"  (1.702 m)   Wt 175 lb (79.4 kg)   SpO2 100%   BMI 27.41 kg/m    Physical Exam Vitals reviewed.  Constitutional:      Appearance: Normal appearance.  HENT:     Head: Normocephalic and atraumatic.  Cardiovascular:     Rate and Rhythm: Normal rate and regular rhythm.     Pulses: Normal pulses.      Heart sounds: Normal heart sounds.  Pulmonary:     Effort: Pulmonary effort is normal.     Breath sounds: Normal breath sounds.  Skin:    General: Skin is warm and dry.  Neurological:     Mental Status: She is alert and oriented to person, place, and time.  Psychiatric:        Mood and Affect: Mood normal.        Behavior: Behavior normal.        Thought Content: Thought content normal.        Judgment: Judgment normal.      Assessment & Plan:   Diabetes mellitus - existing problem with no new complications reported. Currently on Mounjaro. Continue same.  Lab Results  Component Value Date   HGBA1C 5.6 08/07/2023    Hypertension - existing problem. BP improved from last visit after adding amlodipine. Continue amlodipine, lisinopril, and metoprolol.  Bipolar disorder/Depression - existing problem. Current life stressors exacerbating depressive symptoms. Denies SI/HI. Will call psychiatrist on Monday to make an appt. Continue Abilify and Zoloft. Instructed patient to go to ED immediately with onset of SI/HI.  Tobacco use - existing problem. Patient reports approximately 1.5 PPD. Requests LDCT lung cancer screening. Ordered today.   Cristopher Peru, RN

## 2023-10-25 NOTE — Telephone Encounter (Signed)
 Please call [facility] to request DM eye exam.

## 2023-10-25 NOTE — Assessment & Plan Note (Addendum)
 Lab Results  Component Value Date   HGBA1C 5.6 08/07/2023   HGBA1C 6.0 01/08/2023   HGBA1C 5.8 (H) 07/25/2022   Lab Results  Component Value Date   MICROALBUR 1.3 04/10/2023   LDLCALC 53 01/08/2023   CREATININE 0.88 08/07/2023  A1C up to date and at goal.

## 2023-10-27 NOTE — Patient Instructions (Signed)
 VISIT SUMMARY:  Today, you came in for a recheck of your blood pressure, which has shown improvement. We also discussed your current life stressors, alcohol use, smoking history, and sleep issues. Your medications for bipolar disorder and hypertension are working well, and we talked about steps to address your alcohol use and smoking.  YOUR PLAN:  -BIPOLAR DISORDER: Bipolar disorder is a mental health condition characterized by extreme mood swings. You are experiencing situational depression due to life stressors and are currently managed by your psychiatrist with Abilify and Zoloft. Please continue your current medications and contact your psychiatrist for further management.  -HYPERTENSION: Hypertension, or high blood pressure, is well-controlled with your current medications: amlodipine, lisinopril, and metoprolol. Your blood pressure today was 134/79 mmHg. Please continue taking your medications as prescribed.  -ALCOHOL USE DISORDER: Alcohol use disorder involves the excessive consumption of alcohol. You are currently drinking half a gallon of vodka every three days. We recommend gradually reducing your alcohol intake using a shot glass and considering community resources like [facility] and AA for support. Follow up with your psychiatrist to address potential self-medication with alcohol.  -TOBACCO USE DISORDER: Tobacco use disorder is characterized by a dependence on smoking. Given your long history of smoking, we have ordered a low-dose CT scan for lung cancer screening. Consider smoking cessation when your life stressors are reduced.  -GENERAL HEALTH MAINTENANCE: You are up to date with your eye exams and A1c testing. We encourage you to implement regular exercise and good sleep hygiene practices, such as maintaining a consistent sleep schedule and reducing screen time before bed. You may also consider using melatonin for sleep issues.  INSTRUCTIONS:  Please schedule a follow-up  appointment in three months. Ensure you contact your psychiatrist for medication management.

## 2023-10-27 NOTE — Progress Notes (Signed)
 Subjective:     Patient ID: Tanya Wilcox, female    DOB: [birth year], 53 y.o.   MRN: [id number]  Chief Complaint  Patient presents with   Hypertension    Here for follow up   Depression    Here for follow up   Anxiety    Here for follow up   Insomnia    Patient reports having trouble staying sleep    Hypertension Associated symptoms include anxiety.  Depression        Associated symptoms include insomnia.  Past medical history includes anxiety.   Anxiety Symptoms include insomnia.    Insomnia PMH includes: depression.     Discussed the use of AI scribe software for clinical note transcription with the patient, who gave verbal consent to proceed.  History of Present Illness  Tanya Wilcox is a 54 year old female who presents for a recheck of her blood pressure. Her blood pressure today is 134/79 mmHg, showing improvement from previous readings in the 150s. She is currently taking amlodipine, lisinopril, and metoprolol, and tolerates these medications well. She is experiencing significant life stressors, including her sister's recent diagnosis of breast cancer and her brother's multiple myeloma. She describes herself as being in a 'bad place right now' but denies any suicidal or homicidal ideation. She is currently on Abilify and Zoloft, managed by her psychiatrist, whom she plans to contact on Monday. She consumes vodka daily, estimating half a gallon every three days. No withdrawal symptoms when abstaining from alcohol, as she managed to go two days without drinking last week. She acknowledges the need to address her alcohol use in the future. She has a history of smoking for over 20 years and is now open to lung cancer screening due to her family's health issues. She has previously declined a low-dose CT scan due to cost concerns but is now considering it. She has been taking Mounjaro 10 mg, which causes nausea when she eats. She has lost two pounds over the past year. She also  takes Valtrex daily, which she reports keeps her symptoms quiet. She has not used albuterol since December when she was sick. She reports poor sleep and has previously tried trazodone without success. She has melatonin at home but has not been consistent with its use. She struggles with screen time before bed.    Health Maintenance Due  Topic Date Due   COVID-19 Vaccine (5 - 2024-25 season) 03/24/2023    Past Medical History:  Diagnosis Date   Alcohol use    Anxiety    on meds   Bipolar disorder (HCC)    Depression    on meds   Diabetes (HCC)    TYPE 2 ---NO MEDS -- diet controlled   Gestational diabetes    borderline II   Hyperlipidemia    on meds   Hypertension    on meds   Osteoarthritis    RIGHT knee   Tobacco abuse     Past Surgical History:  Procedure Laterality Date   CHOLECYSTECTOMY  2003   GASTRIC ROUX-EN-Y N/A 06/29/2019   Procedure: LAPAROSCOPIC ROUX-EN-Y GASTRIC BYPASS WITH UPPER ENDOSCOPY, ERAS PATHWAY;  Surgeon: Ovidio Kin, MD;  Location: [facility];  Service: General;  Laterality: N/A;   WISDOM TOOTH EXTRACTION      Family History  Problem Relation Age of Onset   Hypertension Mother    Hyperlipidemia Mother    Colon polyps Mother 66   Alcohol abuse Father    Hyperlipidemia  Sister    Hypertension Sister    Hypertension Brother    Cancer Brother        multiple myeloma   Retinal detachment Daughter    Diabetes Neg Hx    Colon cancer Neg Hx    Rectal cancer Neg Hx    Stomach cancer Neg Hx    Esophageal cancer Neg Hx     Social History   Socioeconomic History   Marital status: Single    Spouse name: Not on file   Number of children: Not on file   Years of education: Not on file   Highest education level: 12th grade  Occupational History   Occupation: IT trainer: [employer]  Tobacco Use   Smoking status: Every Day    Current packs/day: 1.50    Types: Cigarettes   Smokeless tobacco: Never  Vaping Use   Vaping status: Never Used   Substance and Sexual Activity   Alcohol use: Yes    Alcohol/week: 2.0 standard drinks of alcohol    Types: 2 Standard drinks or equivalent per week    Comment: occasionally   Drug use: No    Comment: some marijuana   Sexual activity: Not on file  Other Topics Concern   Not on file  Social History Narrative   Lives with daughter   Works at [employer] and [employer]   Enjoys sleeping   1 dog   Completed high school, some college   Social Drivers of Health   Financial Resource Strain: Medium Risk (08/07/2023)   Overall Financial Resource Strain (CARDIA)    Difficulty of Paying Living Expenses: Somewhat hard  Food Insecurity: Food Insecurity Present (08/07/2023)   Hunger Vital Sign    Worried About Running Out of Food in the Last Year: Sometimes true    Ran Out of Food in the Last Year: Never true  Transportation Needs: No Transportation Needs (08/07/2023)   PRAPARE - Administrator, Civil Service (Medical): No    Lack of Transportation (Non-Medical): No  Physical Activity: Unknown (08/07/2023)   Exercise Vital Sign    Days of Exercise per Week: 0 days    Minutes of Exercise per Session: Not on file  Stress: Stress Concern Present (08/07/2023)   [employer] of Occupational Health - Occupational Stress Questionnaire    Feeling of Stress : Very much  Social Connections: Moderately Integrated (08/07/2023)   Social Connection and Isolation Panel [NHANES]    Frequency of Communication with Friends and Family: Three times a week    Frequency of Social Gatherings with Friends and Family: Once a week    Attends Religious Services: 1 to 4 times per year    Active Member of [employer] or Organizations: Yes    Attends Engineer, structural: More than 4 times per year    Marital Status: Never married  Intimate Partner Violence: Not on file    Outpatient Medications Prior to Visit  Medication Sig Dispense Refill   ALPRAZolam (XANAX) 0.5 MG tablet Take 0.5 mg by mouth  2 (two) times daily as needed for anxiety.   1   amLODipine (NORVASC) 5 MG tablet Take 1 tablet (5 mg total) by mouth daily. 90 tablet 1   ARIPiprazole (ABILIFY) 5 MG tablet Take 5 mg by mouth daily.   1   atorvastatin (LIPITOR) 10 MG tablet Take 1 tablet (10 mg total) by mouth daily. 90 tablet 0   Blood Glucose Monitoring Suppl (ONETOUCH VERIO) w/Device  KIT USE TO TEST BLOOD SUGAR THREE TIMES DAILY 1 kit 0   furosemide (LASIX) 20 MG tablet TAKE 1 TABLET BY MOUTH DAILY AS  NEEDED 90 tablet 3   levonorgestrel (MIRENA) 20 MCG/24HR IUD 1 each by Intrauterine route once.     lisinopril (ZESTRIL) 10 MG tablet TAKE 1 TABLET(10 MG) BY MOUTH DAILY 90 tablet 1   metoprolol succinate (TOPROL-XL) 50 MG 24 hr tablet TAKE 1 TABLET BY MOUTH DAILY  TAKE WITH OR IMMEDIATELY  FOLLOWING A MEAL 90 tablet 3   Multiple Vitamins-Minerals (BARIATRIC MULTIVITAMINS/IRON PO) Take 2 tablets by mouth 2 (two) times daily.     ONETOUCH VERIO test strip CHECK BLOOD SUGAR THREE TIMES DAILY 200 strip 1   pantoprazole (PROTONIX) 40 MG tablet Take 1 tablet (40 mg total) by mouth daily as needed. 90 tablet 1   sertraline (ZOLOFT) 25 MG tablet Take 1 tablet (25 mg total) by mouth daily. 90 tablet 1   tirzepatide (MOUNJARO) 10 MG/0.5ML Pen INJECT THE CONTENTS OF ONE PEN  SUBCUTANEOUSLY WEEKLY AS  DIRECTED 6 mL 0   valACYclovir (VALTREX) 1000 MG tablet Take 1 tablet (1,000 mg total) by mouth daily. 90 tablet 1   Varenicline Tartrate, Starter, (CHANTIX STARTING MONTH PAK) 0.5 MG X 11 & 1 MG X 42 TBPK Take one 0.5 mg tablet by mouth once daily for 3 days, then increase to one 0.5 mg tablet twice daily for 4 days, then increase to one 1 mg tablet twice daily. 53 each 0   albuterol (VENTOLIN HFA) 108 (90 Base) MCG/ACT inhaler Inhale 1-2 puffs into the lungs every 6 (six) hours as needed for wheezing or shortness of breath. 1 each 0   benzonatate (TESSALON) 200 MG capsule Take 1 capsule (200 mg total) by mouth 3 (three) times daily as needed.  20 capsule 0   fluconazole (DIFLUCAN) 150 MG tablet Take 1 tab by mouth today. May repeat in 3 days if symptoms do not improe. 2 tablet 0   No facility-administered medications prior to visit.    Allergies  Allergen Reactions   Trazodone Other (See Comments)    Review of Systems  Psychiatric/Behavioral:  Positive for depression. The patient has insomnia.        Objective:    Physical Exam Constitutional:      General: She is not in acute distress.    Appearance: Normal appearance. She is well-developed.  HENT:     Head: Normocephalic and atraumatic.     Right Ear: External ear normal.     Left Ear: External ear normal.  Eyes:     General: No scleral icterus. Neck:     Thyroid: No thyromegaly.  Cardiovascular:     Rate and Rhythm: Normal rate and regular rhythm.     Heart sounds: Normal heart sounds. No murmur heard. Pulmonary:     Effort: Pulmonary effort is normal. No respiratory distress.     Breath sounds: Normal breath sounds. No wheezing.  Musculoskeletal:     Cervical back: Neck supple.  Skin:    General: Skin is warm and dry.  Neurological:     Mental Status: She is alert and oriented to person, place, and time.  Psychiatric:        Mood and Affect: Mood normal.        Behavior: Behavior normal.        Thought Content: Thought content normal.        Judgment: Judgment normal.  BP 134/79 (BP Location: Right Arm, Patient Position: Sitting)   Pulse 70   Temp 98.9 F (37.2 C) (Oral)   Resp 16   Ht 5\' 7"  (1.702 m)   Wt 175 lb (79.4 kg)   SpO2 100%   BMI 27.41 kg/m  Wt Readings from Last 3 Encounters:  10/25/23 175 lb (79.4 kg)  08/07/23 185 lb (83.9 kg)  04/10/23 184 lb 9.6 oz (83.7 kg)       Assessment & Plan:   Problem List Items Addressed This Visit       Unprioritized   Tobacco abuse - Primary   Long history of smoking. Open to lung cancer screening. Previously attempted cessation with Chantix. - Order low-dose CT scan for lung  cancer screening. - Encourage consideration of smoking cessation when life stressors are reduced. General Health       Relevant Orders   CT CHEST LUNG CA SCREEN LOW DOSE W/O CM   HTN (hypertension)    Hypertension Well-controlled with amlodipine, lisinopril, and metoprolol. Blood pressure 134/79 mmHg. - Continue current antihypertensive regimen with amlodipine, lisinopril, and metoprolol.       Controlled type 2 diabetes mellitus with complication, without long-term current use of insulin (HCC)   Lab Results  Component Value Date   HGBA1C 5.6 08/07/2023   HGBA1C 6.0 01/08/2023   HGBA1C 5.8 (H) 07/25/2022   Lab Results  Component Value Date   MICROALBUR 1.3 04/10/2023   LDLCALC 53 01/08/2023   CREATININE 0.88 08/07/2023  A1C up to date and at goal.       Bipolar disorder [facility])    Experiencing situational depression due to life stressors. Managed by psychiatrist with Abilify and Zoloft. Not suicidal or homicidal. - Contact psychiatrist for medication management. - Continue current psychiatric medications as prescribed.        Alcohol abuse, episodic   Consumes half a gallon of vodka every three days. No withdrawal symptoms. Advised gradual reduction due to life stressors. - Advise gradual reduction of alcohol intake using a shot glass. - Discuss potential community resources for support, including [facility] and AA. - Encourage follow-up with psychiatrist to address her uncontrolled depression.       I have discontinued Larena Glassman. Fukuda's fluconazole, albuterol, and benzonatate. I am also having her maintain her levonorgestrel, ALPRAZolam, ARIPiprazole, OneTouch Verio, Multiple Vitamins-Minerals (BARIATRIC MULTIVITAMINS/IRON PO), sertraline, OneTouch Verio, atorvastatin, valACYclovir, Varenicline Tartrate (Starter), pantoprazole, amLODipine, Mounjaro, metoprolol succinate, furosemide, and lisinopril.  No orders of the defined types were placed in this encounter.

## 2023-10-27 NOTE — Assessment & Plan Note (Signed)
 Consumes half a gallon of vodka every three days. No withdrawal symptoms. Advised gradual reduction due to life stressors. - Advise gradual reduction of alcohol intake using a shot glass. - Discuss potential community resources for support, including [facility] and AA. - Encourage follow-up with psychiatrist to address her uncontrolled depression.

## 2023-10-27 NOTE — Assessment & Plan Note (Signed)
 Long history of smoking. Open to lung cancer screening. Previously attempted cessation with Chantix. - Order low-dose CT scan for lung cancer screening. - Encourage consideration of smoking cessation when life stressors are reduced. General Health

## 2023-10-27 NOTE — Assessment & Plan Note (Signed)
  Hypertension Well-controlled with amlodipine, lisinopril, and metoprolol. Blood pressure 134/79 mmHg. - Continue current antihypertensive regimen with amlodipine, lisinopril, and metoprolol.

## 2023-10-27 NOTE — Assessment & Plan Note (Signed)
  Experiencing situational depression due to life stressors. Managed by psychiatrist with Abilify and Zoloft. Not suicidal or homicidal. - Contact psychiatrist for medication management. - Continue current psychiatric medications as prescribed.

## 2023-10-28 ENCOUNTER — Encounter: Payer: Self-pay | Admitting: Family

## 2023-10-29 NOTE — Telephone Encounter (Signed)
 Electronic request made

## 2023-11-07 ENCOUNTER — Telehealth: Payer: Self-pay | Admitting: Family

## 2023-11-07 ENCOUNTER — Telehealth (HOSPITAL_BASED_OUTPATIENT_CLINIC_OR_DEPARTMENT_OTHER): Payer: Self-pay

## 2023-11-07 NOTE — Telephone Encounter (Signed)
 Copied from CRM [id number]. Topic: General - Other >> Nov 07, 2023 11:53 AM Janise Melia C wrote: Reason for CRM: patient called and stated that her appt for a CT chest screening was canceled because her insurance did not cover it. She is asking for it to be sent to [facility] imaging instead because it was approved there. Please call and advise.

## 2023-11-07 NOTE — Telephone Encounter (Signed)
 Spoke w/ Pt -informed she can call [facility]- telephone number given.

## 2023-11-08 ENCOUNTER — Ambulatory Visit (HOSPITAL_BASED_OUTPATIENT_CLINIC_OR_DEPARTMENT_OTHER)

## 2023-12-03 ENCOUNTER — Encounter: Payer: Self-pay | Admitting: Gastroenterology

## 2023-12-03 ENCOUNTER — Ambulatory Visit (AMBULATORY_SURGERY_CENTER)

## 2023-12-03 VITALS — Ht 67.0 in | Wt 174.0 lb

## 2023-12-03 DIAGNOSIS — Z860100 Personal history of colon polyps, unspecified: Secondary | ICD-10-CM

## 2023-12-03 MED ORDER — NA SULFATE-K SULFATE-MG SULF 17.5-3.13-1.6 GM/177ML PO SOLN: 1.0000 | Freq: Once | ORAL | 0 refills | Status: AC

## 2023-12-03 NOTE — Progress Notes (Signed)

## 2023-12-09 ENCOUNTER — Encounter (HOSPITAL_COMMUNITY): Payer: Self-pay | Admitting: Gastroenterology

## 2023-12-09 ENCOUNTER — Other Ambulatory Visit: Payer: Self-pay | Admitting: Family

## 2023-12-09 DIAGNOSIS — A6000 Herpesviral infection of urogenital system, unspecified: Secondary | ICD-10-CM

## 2023-12-10 ENCOUNTER — Ambulatory Visit
Admission: RE | Admit: 2023-12-10 | Discharge: 2023-12-10 | Disposition: A | Discharge status: Home / Self Care | Source: Ambulatory Visit | Attending: Family | Admitting: Family

## 2023-12-10 DIAGNOSIS — Z720 Tobacco use: Secondary | ICD-10-CM

## 2023-12-12 ENCOUNTER — Telehealth: Payer: Self-pay

## 2023-12-12 ENCOUNTER — Other Ambulatory Visit: Payer: Self-pay | Admitting: Family

## 2023-12-12 NOTE — Telephone Encounter (Signed)
 Procedure: COLON Procedure date: 12/17/23 Procedure location: [facility] Arrival Time: 8:15 Spoke with the patient Y/N: Y Any prep concerns? N  Has the patient obtained the prep from the pharmacy ? Y Do you have a care partner and transportation: Y Any additional concerns? N

## 2023-12-17 ENCOUNTER — Encounter (HOSPITAL_COMMUNITY): Payer: Self-pay | Admitting: Gastroenterology

## 2023-12-17 ENCOUNTER — Other Ambulatory Visit (HOSPITAL_BASED_OUTPATIENT_CLINIC_OR_DEPARTMENT_OTHER): Payer: Self-pay

## 2023-12-17 ENCOUNTER — Ambulatory Visit (HOSPITAL_COMMUNITY)
Admission: RE | Admit: 2023-12-17 | Discharge: 2023-12-17 | Disposition: A | Discharge status: Home / Self Care | Source: Ambulatory Visit | Attending: Gastroenterology | Admitting: Gastroenterology

## 2023-12-17 ENCOUNTER — Ambulatory Visit (HOSPITAL_COMMUNITY): Admitting: Anesthesiology

## 2023-12-17 ENCOUNTER — Encounter (HOSPITAL_COMMUNITY)
Admission: RE | Disposition: A | Discharge status: Home / Self Care | Payer: Self-pay | Source: Ambulatory Visit | Attending: Gastroenterology

## 2023-12-17 ENCOUNTER — Other Ambulatory Visit: Payer: Self-pay

## 2023-12-17 DIAGNOSIS — E119 Type 2 diabetes mellitus without complications: Secondary | ICD-10-CM | POA: Insufficient documentation

## 2023-12-17 DIAGNOSIS — F419 Anxiety disorder, unspecified: Secondary | ICD-10-CM | POA: Diagnosis not present

## 2023-12-17 DIAGNOSIS — Z1211 Encounter for screening for malignant neoplasm of colon: Secondary | ICD-10-CM

## 2023-12-17 DIAGNOSIS — Z79899 Other long term (current) drug therapy: Secondary | ICD-10-CM | POA: Insufficient documentation

## 2023-12-17 DIAGNOSIS — Z860100 Personal history of colon polyps, unspecified: Secondary | ICD-10-CM

## 2023-12-17 DIAGNOSIS — Z7985 Long-term (current) use of injectable non-insulin antidiabetic drugs: Secondary | ICD-10-CM | POA: Diagnosis not present

## 2023-12-17 DIAGNOSIS — K5730 Diverticulosis of large intestine without perforation or abscess without bleeding: Secondary | ICD-10-CM

## 2023-12-17 DIAGNOSIS — F17210 Nicotine dependence, cigarettes, uncomplicated: Secondary | ICD-10-CM | POA: Insufficient documentation

## 2023-12-17 DIAGNOSIS — K219 Gastro-esophageal reflux disease without esophagitis: Secondary | ICD-10-CM | POA: Diagnosis not present

## 2023-12-17 DIAGNOSIS — I10 Essential (primary) hypertension: Secondary | ICD-10-CM | POA: Diagnosis not present

## 2023-12-17 DIAGNOSIS — K648 Other hemorrhoids: Secondary | ICD-10-CM

## 2023-12-17 DIAGNOSIS — Z860101 Personal history of adenomatous and serrated colon polyps: Secondary | ICD-10-CM | POA: Diagnosis present

## 2023-12-17 DIAGNOSIS — Z5986 Financial insecurity: Secondary | ICD-10-CM | POA: Diagnosis not present

## 2023-12-17 DIAGNOSIS — Z800 Family history of malignant neoplasm of digestive organs: Secondary | ICD-10-CM

## 2023-12-17 DIAGNOSIS — M1990 Unspecified osteoarthritis, unspecified site: Secondary | ICD-10-CM | POA: Insufficient documentation

## 2023-12-17 DIAGNOSIS — Z8249 Family history of ischemic heart disease and other diseases of the circulatory system: Secondary | ICD-10-CM | POA: Insufficient documentation

## 2023-12-17 DIAGNOSIS — F319 Bipolar disorder, unspecified: Secondary | ICD-10-CM | POA: Insufficient documentation

## 2023-12-17 DIAGNOSIS — K640 First degree hemorrhoids: Secondary | ICD-10-CM

## 2023-12-17 HISTORY — PX: COLONOSCOPY: SHX5424

## 2023-12-17 LAB — GLUCOSE, CAPILLARY
Glucose-Capillary: 51 mg/dL — ABNORMAL LOW (ref 70–99)
Glucose-Capillary: 72 mg/dL (ref 70–99)
Glucose-Capillary: 86 mg/dL (ref 70–99)

## 2023-12-17 SURGERY — COLONOSCOPY
Anesthesia: Monitor Anesthesia Care

## 2023-12-17 MED ORDER — SODIUM CHLORIDE 0.9 % IV SOLN: INTRAVENOUS | Status: DC | PRN

## 2023-12-17 MED ORDER — DEXTROSE 50 % IV SOLN
25.0000 mL | Freq: Once | INTRAVENOUS | Status: AC
  Administered 2023-12-17: 25 mL via INTRAVENOUS

## 2023-12-17 MED ORDER — GLYCOPYRROLATE PF 0.2 MG/ML IJ SOSY
PREFILLED_SYRINGE | INTRAMUSCULAR | Status: DC | PRN
  Administered 2023-12-17: .2 mg via INTRAVENOUS

## 2023-12-17 MED ORDER — PROPOFOL 1000 MG/100ML IV EMUL
INTRAVENOUS | Status: AC
  Filled 2023-12-17: qty 100

## 2023-12-17 MED ORDER — SODIUM CHLORIDE 0.9 % IV SOLN: INTRAVENOUS | Status: DC

## 2023-12-17 MED ORDER — PROPOFOL 10 MG/ML IV BOLUS
INTRAVENOUS | Status: DC | PRN
Start: 2023-12-17 — End: 2023-12-17
  Administered 2023-12-17: 150 ug/kg/min via INTRAVENOUS
  Administered 2023-12-17: 50 mg via INTRAVENOUS

## 2023-12-17 MED ORDER — DEXTROSE 50 % IV SOLN
INTRAVENOUS | Status: AC
  Filled 2023-12-17: qty 50

## 2023-12-17 NOTE — Anesthesia Preprocedure Evaluation (Addendum)
 Anesthesia Evaluation  Patient identified by MRN, date of birth, ID band Patient awake    Reviewed: Allergy & Precautions, NPO status , Patient's Chart, lab work & pertinent test results  History of Anesthesia Complications Negative for: history of anesthetic complications  Airway Mallampati: II  TM Distance: >3 FB Neck ROM: Full    Dental  (+) Dental Advisory Given   Pulmonary Current SmokerPatient did not abstain from smoking.  Nasal congestion which started this weekend, no fevers or coughing    Pulmonary exam normal        Cardiovascular hypertension, Pt. on medications and Pt. on home beta blockers Normal cardiovascular exam     Neuro/Psych  PSYCHIATRIC DISORDERS Anxiety Depression Bipolar Disorder   negative neurological ROS     GI/Hepatic ,GERD  Medicated and Controlled,,(+)     substance abuse  alcohol use and marijuana use  Endo/Other  diabetes, Well Controlled, Type 2    Renal/GU negative Renal ROS     Musculoskeletal  (+) Arthritis ,    Abdominal   Peds  Hematology negative hematology ROS (+)   Anesthesia Other Findings On GLP-1a HSV Raynaud's phenomena   Reproductive/Obstetrics                             Anesthesia Physical Anesthesia Plan  ASA: 3  Anesthesia Plan: MAC   Post-op Pain Management: Minimal or no pain anticipated   Induction:   PONV Risk Score and Plan: 2 and Propofol  infusion and Treatment may vary due to age or medical condition  Airway Management Planned: Nasal Cannula and Natural Airway  Additional Equipment: None  Intra-op Plan:   Post-operative Plan:   Informed Consent: I have reviewed the patients History and Physical, chart, labs and discussed the procedure including the risks, benefits and alternatives for the proposed anesthesia with the patient or authorized representative who has indicated his/her understanding and acceptance.        Plan Discussed with: CRNA and Anesthesiologist  Anesthesia Plan Comments:         Anesthesia Quick Evaluation

## 2023-12-17 NOTE — Discharge Instructions (Signed)

## 2023-12-17 NOTE — Interval H&P Note (Signed)
 History and Physical Interval Note:  12/17/2023 9:45 AM  Bjorn Bullocks  has presented today for surgery, with the diagnosis of hx of colon polyp, checking polypectomy site.  The various methods of treatment have been discussed with the patient and family. After consideration of risks, benefits and other options for treatment, the patient has consented to  Procedure(s): COLONOSCOPY (N/A) as a surgical intervention.  The patient's history has been reviewed, patient examined, no change in status, stable for surgery.  I have reviewed the patient's chart and labs.  Questions were answered to the patient's satisfaction.     Laquetta Plank Emree Locicero

## 2023-12-17 NOTE — Op Note (Signed)
 [facility] Patient Name: Tanya Wilcox Procedure Date: 12/17/2023 MRN: [id number] Attending MD: Harry Lindau , MD, [id number] Date of Birth: 08/16/1970 CSN: [id number] Age: 53 Admit Type: Outpatient Procedure:                Colonoscopy Indications:              Screening in patient at increased risk: Colorectal                            cancer in mother before age 55, High risk colon                            cancer surveillance: Personal history of sessile                            serrated colon polyp (10 mm or greater in size)                           Last colonoscopy was 09/2020 and notable for 25 mm                            flat polyp removed from ascending colon, removed                            via EMR with piecemeal resection and further                            resection of edges with cold forceps via avulsion                            technique (path: SSP). Contralateral wall was                            tattooed and the polypectomy site clipped close.                            There were 2 smaller 3-4 mm polyps removed from the                            rectum and transverse colon (path: TA x 1, HP x 1)                            along with ascending/sigmoid diverticulosis and a                            normal TI. Had recommended repeat colonoscopy in 6                            months for short interval surveillance.                           No recent GI symptoms. Mother diagnosed with colon  cancer, age <68. Providers:                Harry Lindau, MD, Golda Latch, RN, Joline Ned, Technician Referring MD:              Medicines:                Monitored Anesthesia Care Complications:            No immediate complications. Estimated Blood Loss:     Estimated blood loss: none. Procedure:                Pre-Anesthesia Assessment:                           - Prior to the  procedure, a History and Physical                            was performed, and patient medications and                            allergies were reviewed. The patient's tolerance of                            previous anesthesia was also reviewed. The risks                            and benefits of the procedure and the sedation                            options and risks were discussed with the patient.                            All questions were answered, and informed consent                            was obtained. Prior Anticoagulants: The patient has                            taken no anticoagulant or antiplatelet agents. ASA                            Grade Assessment: III - A patient with severe                            systemic disease. After reviewing the risks and                            benefits, the patient was deemed in satisfactory                            condition to undergo the procedure.  After obtaining informed consent, the colonoscope                            was passed under direct vision. Throughout the                            procedure, the patient's blood pressure, pulse, and                            oxygen saturations were monitored continuously. The                            CF-HQ190L ([id number]) Olympus colonoscope was                            introduced through the anus and advanced to the the                            terminal ileum. The colonoscopy was performed                            without difficulty. The patient tolerated the                            procedure well. The quality of the bowel                            preparation was good. The terminal ileum, ileocecal                            valve, appendiceal orifice, and rectum were                            photographed. Scope In: 10:02:18 AM Scope Out: 10:17:05 AM Scope Withdrawal Time: 0 hours 12 minutes 31 seconds  Total Procedure Duration: 0  hours 14 minutes 47 seconds  Findings:      The perianal and digital rectal examinations were normal.      A tattoo was seen in the ascending colon. The tattoo site appeared       normal.      Multiple small-mouthed diverticula were found in the sigmoid colon and       ascending colon.      The exam was otherwise normal throughout the remainder of the colon.       Made several passes through the right colon, including retroflexion in       the right colon. Clear visualization of the previously-placed tattoo,       but no polypoid tissue or residual polyp noted.      Retroflexion in the right colon was performed.      The terminal ileum appeared normal.      Non-bleeding internal hemorrhoids were found during retroflexion. The       hemorrhoids were small. Impression:               - A tattoo was seen in the ascending colon. The  tattoo site appeared normal.                           - Diverticulosis in the sigmoid colon and in the                            ascending colon.                           - The examined portion of the ileum was normal.                           - Non-bleeding internal hemorrhoids.                           - No specimens collected. Moderate Sedation:      Not Applicable - Patient had care per Anesthesia. Recommendation:           - Patient has a contact number available for                            emergencies. The signs and symptoms of potential                            delayed complications were discussed with the                            patient. Return to normal activities tomorrow.                            Written discharge instructions were provided to the                            patient.                           - Resume previous diet.                           - Continue present medications.                           - Repeat colonoscopy in 3 years for surveillance                            due to family  history and personal history.                           - Return to GI office PRN. Procedure Code(s):        --- Professional ---                           W1191, Colorectal cancer screening; colonoscopy on                            individual at high risk Diagnosis Code(s):        ---  Professional ---                           Z80.0, Family history of malignant neoplasm of                            digestive organs                           Z86.010, Personal history of colonic polyps                           K64.8, Other hemorrhoids                           K57.30, Diverticulosis of large intestine without                            perforation or abscess without bleeding CPT copyright 2022 American Medical Association. All rights reserved. The codes documented in this report are preliminary and upon coder review may  be revised to meet current compliance requirements. Harry Lindau, MD 12/17/2023 10:25:39 AM Number of Addenda: 0

## 2023-12-17 NOTE — H&P (Signed)
 GASTROENTEROLOGY PROCEDURE H&P NOTE   Primary Care Physician: Dorrene Gaucher, NP    Reason for Procedure:  Colon polyp surveillance  Plan:    Colonoscopy   Patient is appropriate for endoscopic procedure(s) at [facility].  The nature of the procedure, as well as the risks, benefits, and alternatives were carefully and thoroughly reviewed with the patient. Ample time for discussion and questions allowed. The patient understood, was satisfied, and agreed to proceed.     HPI: Tanya Wilcox is a 53 y.o. female who presents for colonoscopy for ongoing polyp surveillance.  Last colonoscopy was 09/2020 and notable for 25 mm flat polyp removed from ascending colon, removed via EMR with piecemeal resection and further resection of edges with cold forceps via avulsion technique (path: SSP).  Contralateral wall was tattooed and the polypectomy site clipped close.  There were 2 smaller 3-4 mm polyps removed from the rectum and transverse colon (path: TA x 1, HP x 1) along with ascending/sigmoid diverticulosis and a normal TI.  Had recommended repeat colonoscopy in 6 months for short interval surveillance due to piecemeal resection, but this was delayed by patient.  No recent GI symptoms.  Past Medical History:  Diagnosis Date   Alcohol use    Allergy    Anxiety    on meds   Bipolar disorder (HCC)    Depression    on meds   Diabetes (HCC)    TYPE 2 ---NO MEDS -- diet controlled   Gestational diabetes    borderline II   Hyperlipidemia    on meds   Hypertension    on meds   Osteoarthritis    RIGHT knee   Tobacco abuse     Past Surgical History:  Procedure Laterality Date   CHOLECYSTECTOMY  2003   GASTRIC ROUX-EN-Y N/A 06/29/2019   Procedure: LAPAROSCOPIC ROUX-EN-Y GASTRIC BYPASS WITH UPPER ENDOSCOPY, ERAS PATHWAY;  Surgeon: Juanita Norlander, MD;  Location: [facility]S;  Service: General;  Laterality: N/A;   WISDOM TOOTH EXTRACTION      Prior to  Admission medications   Medication Sig Start Date End Date Taking? Authorizing Provider  amLODipine  (NORVASC ) 5 MG tablet Take 1 tablet (5 mg total) by mouth daily. 08/07/23  Yes O'Sullivan, Melissa, NP  ARIPiprazole (ABILIFY) 5 MG tablet Take 5 mg by mouth daily.  09/11/16  Yes [provider]  atorvastatin  (LIPITOR) 10 MG tablet Take 1 tablet (10 mg total) by mouth daily. 06/27/23  Yes Dorrene Gaucher, NP  furosemide  (LASIX ) 20 MG tablet TAKE 1 TABLET BY MOUTH DAILY AS  NEEDED 10/06/23  Yes Webb, Padonda B, FNP  lisinopril  (ZESTRIL ) 10 MG tablet TAKE 1 TABLET(10 MG) BY MOUTH DAILY 10/15/23  Yes O'Sullivan, Melissa, NP  metoprolol  succinate (TOPROL -XL) 50 MG 24 hr tablet TAKE 1 TABLET BY MOUTH DAILY  TAKE WITH OR IMMEDIATELY  FOLLOWING A MEAL 10/06/23  Yes Webb, Padonda B, FNP  Multiple Vitamins-Minerals (BARIATRIC MULTIVITAMINS/IRON PO) Take 2 tablets by mouth 2 (two) times daily.   Yes [provider]  pantoprazole  (PROTONIX ) 40 MG tablet Take 1 tablet (40 mg total) by mouth daily as needed. 08/07/23  Yes Dorrene Gaucher, NP  sertraline  (ZOLOFT ) 25 MG tablet Take 1 tablet (25 mg total) by mouth daily. 12/23/20  Yes O'Sullivan, Melissa, NP  valACYclovir  (VALTREX ) 1000 MG tablet Take 1 tablet (1,000 mg total) by mouth daily. 08/01/23  Yes O'Sullivan, Melissa, NP  ALPRAZolam  (XANAX ) 0.5 MG tablet Take 0.5 mg by  mouth 2 (two) times daily as needed for anxiety.  Patient not taking: Reported on 12/03/2023 08/14/16   [provider]  Blood Glucose Monitoring Suppl (ONETOUCH VERIO) w/Device KIT USE TO TEST BLOOD SUGAR THREE TIMES DAILY 05/05/19   Lajean Pike, MD  levonorgestrel (MIRENA) 20 MCG/24HR IUD 1 each by Intrauterine route once. 06/24/16   [provider]  ONETOUCH VERIO test strip CHECK BLOOD SUGAR THREE TIMES DAILY 06/05/21   Lajean Pike, MD  tirzepatide  (MOUNJARO ) 10 MG/0.5ML Pen Inject 10 mg into the skin once a week. 12/12/23   Dorrene Gaucher, NP     Current Facility-Administered Medications  Medication Dose Route Frequency Provider Last Rate Last Admin   0.9 %  sodium chloride  infusion   Intravenous Continuous Darria Corvera V, DO 20 mL/hr at 12/17/23 0914 New Bag at 12/17/23 0914    Allergies as of 10/23/2023 - Review Complete 08/07/2023  Allergen Reaction Noted   Trazodone Other (See Comments) 12/23/2020    Family History  Problem Relation Age of Onset   Hypertension Mother    Hyperlipidemia Mother    Colon polyps Mother 70   Alcohol abuse Father    Hyperlipidemia Sister    Hypertension Sister    Hypertension Brother    Cancer Brother        multiple myeloma   Retinal detachment Daughter    Diabetes Neg Hx    Colon cancer Neg Hx    Rectal cancer Neg Hx    Stomach cancer Neg Hx    Esophageal cancer Neg Hx     Social History   Socioeconomic History   Marital status: Single    Spouse name: Not on file   Number of children: Not on file   Years of education: Not on file   Highest education level: 12th grade  Occupational History   Occupation: IT trainer: [employer]  Tobacco Use   Smoking status: Every Day    Current packs/day: 1.50    Types: Cigarettes   Smokeless tobacco: Never  Vaping Use   Vaping status: Never Used  Substance and Sexual Activity   Alcohol use: Yes    Alcohol/week: 2.0 standard drinks of alcohol    Types: 2 Standard drinks or equivalent per week    Comment: every day   Drug use: Yes    Types: Marijuana    Comment: some marijuana   Sexual activity: Not on file  Other Topics Concern   Not on file  Social History Narrative   Lives with daughter   Works at [employer] and [employer]er [employer]   Enjoys sleeping   1 dog   Completed high school, some college   Social Drivers of Health   Financial Resource Strain: Medium Risk (08/07/2023)   Overall Financial Resource Strain (CARDIA)    Difficulty of Paying Living Expenses: Somewhat hard  Food Insecurity: Food Insecurity Present  (08/07/2023)   Hunger Vital Sign    Worried About Running Out of Food in the Last Year: Sometimes true    Ran Out of Food in the Last Year: Never true  Transportation Needs: No Transportation Needs (08/07/2023)   PRAPARE - Administrator, Civil Service (Medical): No    Lack of Transportation (Non-Medical): No  Physical Activity: Unknown (08/07/2023)   Exercise Vital Sign    Days of Exercise per Week: 0 days    Minutes of Exercise per Session: Not on file  Stress: Stress Concern Present (08/07/2023)   [employer]  of Occupational Health - Occupational Stress Questionnaire    Feeling of Stress : Very much  Social Connections: Moderately Integrated (08/07/2023)   Social Connection and Isolation Panel [NHANES]    Frequency of Communication with Friends and Family: Three times a week    Frequency of Social Gatherings with Friends and Family: Once a week    Attends Religious Services: 1 to 4 times per year    Active Member of [employer] or Organizations: Yes    Attends Engineer, structural: More than 4 times per year    Marital Status: Never married  Catering manager Violence: Not on file    Physical Exam: Vital signs in last 24 hours: @BP  (!) 143/70   Pulse (!) 54   Temp 98.1 F (36.7 C) (Temporal)   Resp 14   Ht 5\' 7"  (1.702 m)   Wt 82.1 kg   SpO2 96%   BMI 28.35 kg/m  GEN: NAD EYE: Sclerae anicteric ENT: MMM CV: Non-tachycardic Pulm: CTA b/l GI: Soft, NT/ND NEURO:  Alert & Oriented x 3   Harry Lindau, DO [facility]   12/17/2023 9:42 AM

## 2023-12-17 NOTE — Transfer of Care (Signed)
 Immediate Anesthesia Transfer of Care Note  Patient: Tanya Wilcox  Procedure(s) Performed: COLONOSCOPY  Patient Location: PACU and Endoscopy Unit  Anesthesia Type:MAC  Level of Consciousness: awake, alert , and oriented  Airway & Oxygen Therapy: Patient Spontanous Breathing  Post-op Assessment: Report given to RN  Post vital signs: Reviewed and stable  Last Vitals:  Vitals Value Taken Time  BP    Temp    Pulse    Resp 23 12/17/23 1025  SpO2    Vitals shown include unfiled device data.  Last Pain:  Vitals:   12/17/23 1023  TempSrc: Temporal  PainSc: 0-No pain         Complications: No notable events documented.

## 2023-12-17 NOTE — Progress Notes (Signed)
 Patient CBG 51 in pre-op. Patient given 1/2 amp D50 per Dr. Glenetta Lane, Anesthesiologist. Recheck CBG 86 before rolling back for procedure.

## 2023-12-17 NOTE — Anesthesia Procedure Notes (Signed)
 Procedure Name: MAC Date/Time: 12/17/2023 9:55 AM  Performed by: Darlena Ego, CRNAPre-anesthesia Checklist: Patient identified, Emergency Drugs available, Suction available, Patient being monitored and Timeout performed Patient Re-evaluated:Patient Re-evaluated prior to induction Oxygen Delivery Method: Simple face mask Preoxygenation: Pre-oxygenation with 100% oxygen Induction Type: IV induction

## 2023-12-17 NOTE — Anesthesia Postprocedure Evaluation (Signed)
 Anesthesia Post Note  Patient: Tanya Wilcox  Procedure(s) Performed: COLONOSCOPY     Patient location during evaluation: PACU Anesthesia Type: MAC Level of consciousness: awake and alert Pain management: pain level controlled Vital Signs Assessment: post-procedure vital signs reviewed and stable Respiratory status: spontaneous breathing, nonlabored ventilation and respiratory function stable Cardiovascular status: stable and blood pressure returned to baseline Anesthetic complications: no   No notable events documented.  Last Vitals:  Vitals:   12/17/23 1030 12/17/23 1040  BP: (!) 100/49 (!) 145/79  Pulse: 75 64  Resp: 18 16  Temp:    SpO2: 100% 100%    Last Pain:  Vitals:   12/17/23 1040  TempSrc:   PainSc: 0-No pain                 Juventino Oppenheim

## 2023-12-19 ENCOUNTER — Encounter (HOSPITAL_COMMUNITY): Payer: Self-pay | Admitting: Gastroenterology

## 2023-12-25 ENCOUNTER — Other Ambulatory Visit: Payer: Self-pay | Admitting: Family

## 2024-01-03 ENCOUNTER — Ambulatory Visit: Payer: Self-pay | Admitting: Family

## 2024-01-30 ENCOUNTER — Encounter (HOSPITAL_COMMUNITY): Payer: Self-pay | Admitting: *Deleted

## 2024-01-31 ENCOUNTER — Ambulatory Visit: Admitting: Family

## 2024-02-20 ENCOUNTER — Other Ambulatory Visit: Payer: Self-pay | Admitting: Family

## 2024-02-20 DIAGNOSIS — A6000 Herpesviral infection of urogenital system, unspecified: Secondary | ICD-10-CM

## 2024-02-20 DIAGNOSIS — E118 Type 2 diabetes mellitus with unspecified complications: Secondary | ICD-10-CM

## 2024-03-19 LAB — LIPID PANEL
Cholesterol: 134 (ref 0–200)
HDL: 56 (ref 35–70)
LDL Cholesterol: 61
Triglycerides: 87 (ref 40–160)

## 2024-03-19 LAB — BASIC METABOLIC PANEL WITH GFR: Glucose: 89

## 2024-03-19 LAB — HEMOGLOBIN A1C: Hemoglobin A1C: 5.5

## 2024-03-20 ENCOUNTER — Ambulatory Visit: Admitting: Family

## 2024-03-24 ENCOUNTER — Ambulatory Visit: Admitting: Family

## 2024-03-24 VITALS — BP 143/67 | HR 65 | Temp 98.2°F | Resp 16 | Ht 67.0 in | Wt 172.4 lb

## 2024-03-24 DIAGNOSIS — R1013 Epigastric pain: Secondary | ICD-10-CM | POA: Diagnosis not present

## 2024-03-24 DIAGNOSIS — I10 Essential (primary) hypertension: Secondary | ICD-10-CM | POA: Diagnosis not present

## 2024-03-24 DIAGNOSIS — R400 Somnolence: Secondary | ICD-10-CM | POA: Diagnosis not present

## 2024-03-24 NOTE — Progress Notes (Signed)
 Subjective:     Patient ID: Tanya Wilcox, female    DOB: June 06, 1971, 53 y.o.   MRN: [id number]  Chief Complaint  Patient presents with   Abdominal Pain    Patient reports having abdominal pain worst after eating   Nausea    Patient reports nausea w/o vomiting    Abdominal Pain    Discussed the use of AI scribe software for clinical note transcription with the patient, who gave verbal consent to proceed.  History of Present Illness  Tanya Wilcox is a 53 year old female who presents with persistent upper abdominal pain and nausea.  She has experienced upper abdominal pain and nausea for at least a month, with symptoms worsening over time. The pain is located in the middle of her upper abdomen and intensifies after alcohol consumption, particularly after excessive intake on her birthday last [birth year]. Alka-Seltzer and Pepto-Bismol provide temporary relief, but the pain recurs, especially postprandially.  Her gallbladder has been removed. She missed doses of pantoprazole  last week due to being off work and forgetting to take it. Alcohol intake has decreased, with consumption on three days in the past week, including two beers on Saturday.  She has bowel movements about once a week, consistent since her gallbladder surgery. She feels constantly tired and lacks energy despite sleeping more than six hours. She snores and has not undergone a sleep study. She is concerned about low food intake due to postprandial nausea, despite feeling hungry. She is currently taking Mounjaro  10 mg but missed her scheduled dose today.     Health Maintenance Due  Topic Date Due   Hepatitis B Vaccines 19-59 Average Risk (1 of 3 - 19+ 3-dose series) Never done   Diabetic kidney evaluation - Urine ACR  09/15/2013   Pneumococcal Vaccine: 50+ Years (2 of 2 - PCV) 01/20/2014   HEMOGLOBIN A1C  02/04/2024   INFLUENZA VACCINE  02/21/2024   COVID-19 Vaccine (5 - 2025-26 season) 03/23/2024    Past Medical  History:  Diagnosis Date   Alcohol use    Allergy    Anxiety    on meds   Bipolar disorder (HCC)    Depression    on meds   Diabetes (HCC)    TYPE 2 ---NO MEDS -- diet controlled   Gestational diabetes    borderline II   Hyperlipidemia    on meds   Hypertension    on meds   Osteoarthritis    RIGHT knee   Tobacco abuse     Past Surgical History:  Procedure Laterality Date   CHOLECYSTECTOMY  2003   COLONOSCOPY N/A 12/17/2023   Procedure: COLONOSCOPY;  Surgeon: San Sandor GAILS, DO;  Location: [facility];  Service: Gastroenterology;  Laterality: N/A;   GASTRIC ROUX-EN-Y N/A 06/29/2019   Procedure: LAPAROSCOPIC ROUX-EN-Y GASTRIC BYPASS WITH UPPER ENDOSCOPY, ERAS PATHWAY;  Surgeon: Ethyl Lenis, MD;  Location: [facility];  Service: General;  Laterality: N/A;   WISDOM TOOTH EXTRACTION      Family History  Problem Relation Age of Onset   Hypertension Mother    Hyperlipidemia Mother    Colon polyps Mother 37   Alcohol abuse Father    Hyperlipidemia Sister    Hypertension Sister    Hypertension Brother    Cancer Brother        multiple myeloma   Retinal detachment Daughter    Diabetes Neg Hx    Colon cancer Neg Hx    Rectal cancer Neg Hx  Stomach cancer Neg Hx    Esophageal cancer Neg Hx     Social History   Socioeconomic History   Marital status: Single    Spouse name: Not on file   Number of children: Not on file   Years of education: Not on file   Highest education level: Some college, no degree  Occupational History   Occupation: IT trainer: [employer]  Tobacco Use   Smoking status: Every Day    Current packs/day: 1.50    Types: Cigarettes   Smokeless tobacco: Never  Vaping Use   Vaping status: Never Used  Substance and Sexual Activity   Alcohol use: Yes    Alcohol/week: 2.0 standard drinks of alcohol    Types: 2 Standard drinks or equivalent per week    Comment: every day   Drug use: Yes    Types: Marijuana    Comment: some marijuana    Sexual activity: Not on file  Other Topics Concern   Not on file  Social History Narrative   Lives with daughter   Works at [employer] and [employer]   Enjoys sleeping   1 dog   Completed high school, some college   Social Drivers of Health   Financial Resource Strain: High Risk (03/24/2024)   Overall Financial Resource Strain (CARDIA)    Difficulty of Paying Living Expenses: Very hard  Food Insecurity: No Food Insecurity (03/24/2024)   Hunger Vital Sign    Worried About Running Out of Food in the Last Year: Never true    Ran Out of Food in the Last Year: Never true  Transportation Needs: No Transportation Needs (03/24/2024)   PRAPARE - Administrator, Civil Service (Medical): No    Lack of Transportation (Non-Medical): No  Physical Activity: Inactive (03/24/2024)   Exercise Vital Sign    Days of Exercise per Week: 0 days    Minutes of Exercise per Session: Not on file  Stress: Stress Concern Present (03/24/2024)   [employer] of Occupational Health - Occupational Stress Questionnaire    Feeling of Stress: Very much  Social Connections: Moderately Isolated (03/24/2024)   Social Connection and Isolation Panel    Frequency of Communication with Friends and Family: Once a week    Frequency of Social Gatherings with Friends and Family: Once a week    Attends Religious Services: More than 4 times per year    Active Member of [employer] or Organizations: Yes    Attends Engineer, structural: More than 4 times per year    Marital Status: Never married  Intimate Partner Violence: Not on file    Outpatient Medications Prior to Visit  Medication Sig Dispense Refill   ALPRAZolam  (XANAX ) 0.5 MG tablet Take 0.5 mg by mouth 2 (two) times daily as needed for anxiety.   1   amLODipine  (NORVASC ) 5 MG tablet Take 1 tablet (5 mg total) by mouth daily. 90 tablet 1   ARIPiprazole (ABILIFY) 5 MG tablet Take 5 mg by mouth daily.   1   atorvastatin  (LIPITOR) 10 MG tablet Take 1 tablet  (10 mg total) by mouth daily. 90 tablet 0   Blood Glucose Monitoring Suppl (ONETOUCH VERIO) w/Device KIT USE TO TEST BLOOD SUGAR THREE TIMES DAILY 1 kit 0   furosemide  (LASIX ) 20 MG tablet TAKE 1 TABLET BY MOUTH DAILY AS  NEEDED 90 tablet 3   levonorgestrel (MIRENA) 20 MCG/24HR IUD 1 each by Intrauterine route once.  lisinopril  (ZESTRIL ) 10 MG tablet TAKE 1 TABLET(10 MG) BY MOUTH DAILY 90 tablet 1   metoprolol  succinate (TOPROL -XL) 50 MG 24 hr tablet TAKE 1 TABLET BY MOUTH DAILY  TAKE WITH OR IMMEDIATELY  FOLLOWING A MEAL 90 tablet 3   Multiple Vitamins-Minerals (BARIATRIC MULTIVITAMINS/IRON PO) Take 2 tablets by mouth 2 (two) times daily.     ONETOUCH VERIO test strip CHECK BLOOD SUGAR THREE TIMES DAILY 200 strip 1   pantoprazole  (PROTONIX ) 40 MG tablet Take 1 tablet (40 mg total) by mouth daily as needed. 90 tablet 1   sertraline  (ZOLOFT ) 25 MG tablet Take 1 tablet (25 mg total) by mouth daily. 90 tablet 1   tirzepatide  (MOUNJARO ) 10 MG/0.5ML Pen INJECT THE CONTENTS OF ONE PEN  SUBCUTANEOUSLY WEEKLY AS  DIRECTED 6 mL 0   valACYclovir  (VALTREX ) 1000 MG tablet TAKE 1 TABLET BY MOUTH DAILY 90 tablet 0   No facility-administered medications prior to visit.    Allergies  Allergen Reactions   Januvia  [Sitagliptin ] Other (See Comments)    Yeast infections   Trazodone Other (See Comments)    Review of Systems  Gastrointestinal:  Positive for abdominal pain.       Objective:    Physical Exam Constitutional:      General: She is not in acute distress.    Appearance: Normal appearance. She is well-developed.  HENT:     Head: Normocephalic and atraumatic.     Right Ear: External ear normal.     Left Ear: External ear normal.  Eyes:     General: No scleral icterus. Neck:     Thyroid : No thyromegaly.  Cardiovascular:     Rate and Rhythm: Normal rate and regular rhythm.     Heart sounds: Normal heart sounds. No murmur heard. Pulmonary:     Effort: Pulmonary effort is normal. No  respiratory distress.     Breath sounds: Normal breath sounds. No wheezing.  Abdominal:     General: Abdomen is flat. Bowel sounds are normal.     Palpations: Abdomen is soft.     Tenderness: There is abdominal tenderness (mild) in the epigastric area.  Musculoskeletal:     Cervical back: Neck supple.  Skin:    General: Skin is warm and dry.  Neurological:     Mental Status: She is alert and oriented to person, place, and time.  Psychiatric:        Mood and Affect: Mood normal.        Behavior: Behavior normal.        Thought Content: Thought content normal.        Judgment: Judgment normal.      BP (!) 143/67   Pulse 65   Temp 98.2 F (36.8 C) (Oral)   Resp 16   Ht 5' 7 (1.702 m)   Wt 172 lb 6.4 oz (78.2 kg)   SpO2 100%   BMI 27.00 kg/m  Wt Readings from Last 3 Encounters:  03/24/24 172 lb 6.4 oz (78.2 kg)  12/17/23 181 lb (82.1 kg)  12/03/23 174 lb (78.9 kg)   Lab Results  Component Value Date   HGBA1C 5.6 08/07/2023       Assessment & Plan:   Problem List Items Addressed This Visit       Unprioritized   HTN (hypertension)   BP elevated due to inconsistent use of lisinopril /metoprolol . Restart. Plan to recheck blood pressure when pt returns in 2 weeks.       Epigastric pain -  Primary    Worsening epigastric pain, exacerbated by eating. Possible pancreatitis due to recent alcohol use. Inconsistent pantoprazole  use and Mounjaro  may contribute to symptoms. - Order lipase blood test. - Advise stopping Mounjaro  for two weeks to see if this helps her symptoms -restart pantoprazole  - Follow-up in two weeks to reassess symptoms and review lab results.      Relevant Orders   Comp Met (CMET)   Lipase   CBC w/Diff   Daytime somnolence   Extreme daytime fatigue, reports hx of snoring. Recommend referral to sleep specialist for sleep study to rule out OSA.       Relevant Orders   Ambulatory referral to Pulmonology    I am having Tanya Wilcox. Straley maintain  her levonorgestrel, ALPRAZolam , ARIPiprazole, OneTouch Verio, Multiple Vitamins-Minerals (BARIATRIC MULTIVITAMINS/IRON PO), sertraline , OneTouch Verio, atorvastatin , metoprolol  succinate, furosemide , lisinopril , amLODipine , pantoprazole , valACYclovir , and Mounjaro .  No orders of the defined types were placed in this encounter.

## 2024-03-24 NOTE — Assessment & Plan Note (Signed)
 Extreme daytime fatigue, reports hx of snoring. Recommend referral to sleep specialist for sleep study to rule out OSA.

## 2024-03-24 NOTE — Addendum Note (Signed)
 Addended by: TRECIA ALMARIE GRADE on: 03/24/2024 05:10 PM   Modules accepted: Orders

## 2024-03-24 NOTE — Patient Instructions (Signed)
 VISIT SUMMARY:  You visited us  today due to persistent upper abdominal pain and nausea that has been worsening over the past month. We discussed your symptoms, possible causes, and made a plan to address them. We also talked about your fatigue and possible sleep apnea, as well as your slightly elevated blood pressure.  YOUR PLAN:  CHRONIC UPPER ABDOMINAL PAIN WITH NAUSEA: You have been experiencing worsening pain in the middle of your upper abdomen, especially after eating and drinking alcohol. This may be related to pancreatitis or inconsistent use of your medications. -We will order a blood test to check your pancreatic enzyme levels. -Please stop taking Mounjaro  for two weeks. -Follow up with us  in two weeks to reassess your symptoms and review your lab results.  FATIGUE AND SUSPECTED SLEEP APNEA: You have been feeling constantly tired and snore at night, which may indicate sleep apnea. -We will refer you to a pulmonary group for a home sleep study.  HYPERTENSION: Your blood pressure was slightly elevated, which may be due to delayed medication intake. -We will recheck your blood pressure at your follow-up appointment in two weeks.

## 2024-03-24 NOTE — Assessment & Plan Note (Signed)
  Worsening epigastric pain, exacerbated by eating. Possible pancreatitis due to recent alcohol use. Inconsistent pantoprazole  use and Mounjaro  may contribute to symptoms. - Order lipase blood test. - Advise stopping Mounjaro  for two weeks to see if this helps her symptoms -restart pantoprazole  - Follow-up in two weeks to reassess symptoms and review lab results.

## 2024-03-24 NOTE — Assessment & Plan Note (Signed)
 BP elevated due to inconsistent use of lisinopril /metoprolol . Restart. Plan to recheck blood pressure when pt returns in 2 weeks.

## 2024-03-30 ENCOUNTER — Telehealth: Payer: Self-pay | Admitting: Family

## 2024-03-30 NOTE — Telephone Encounter (Signed)
 Can you please check status of her lab work results, drawn 9/2?

## 2024-03-31 ENCOUNTER — Ambulatory Visit: Payer: Self-pay | Admitting: Family

## 2024-03-31 LAB — CBC WITH DIFFERENTIAL/PLATELET
Basophils Absolute: 0.1 x10E3/uL (ref 0.0–0.2)
Basos: 1 %
EOS (ABSOLUTE): 0.1 x10E3/uL (ref 0.0–0.4)
Eos: 1 %
Hematocrit: 41.5 % (ref 34.0–46.6)
Hemoglobin: 13.9 g/dL (ref 11.1–15.9)
Immature Grans (Abs): 0 x10E3/uL (ref 0.0–0.1)
Immature Granulocytes: 0 %
Lymphocytes Absolute: 2 x10E3/uL (ref 0.7–3.1)
Lymphs: 34 %
MCH: 34.5 pg — ABNORMAL HIGH (ref 26.6–33.0)
MCHC: 33.5 g/dL (ref 31.5–35.7)
MCV: 103 fL — ABNORMAL HIGH (ref 79–97)
Monocytes Absolute: 0.5 x10E3/uL (ref 0.1–0.9)
Monocytes: 8 %
Neutrophils Absolute: 3.3 x10E3/uL (ref 1.4–7.0)
Neutrophils: 56 %
Platelets: 397 x10E3/uL (ref 150–450)
RBC: 4.03 x10E6/uL (ref 3.77–5.28)
RDW: 11.1 % — ABNORMAL LOW (ref 11.7–15.4)
WBC: 5.9 x10E3/uL (ref 3.4–10.8)

## 2024-03-31 LAB — COMPREHENSIVE METABOLIC PANEL WITH GFR
ALT: 18 IU/L (ref 0–32)
AST: 20 IU/L (ref 0–40)
Albumin: 4.6 g/dL (ref 3.8–4.9)
Alkaline Phosphatase: 78 IU/L (ref 44–121)
BUN/Creatinine Ratio: 13 (ref 9–23)
BUN: 10 mg/dL (ref 6–24)
Bilirubin Total: 0.3 mg/dL (ref 0.0–1.2)
CO2: 20 mmol/L (ref 20–29)
Calcium: 9.6 mg/dL (ref 8.7–10.2)
Chloride: 105 mmol/L (ref 96–106)
Creatinine, Ser: 0.75 mg/dL (ref 0.57–1.00)
Globulin, Total: 2.6 g/dL (ref 1.5–4.5)
Glucose: 102 mg/dL — ABNORMAL HIGH (ref 70–99)
Potassium: 4.6 mmol/L (ref 3.5–5.2)
Sodium: 140 mmol/L (ref 134–144)
Total Protein: 7.2 g/dL (ref 6.0–8.5)
eGFR: 95 mL/min/1.73 (ref >59)

## 2024-03-31 LAB — LIPASE: Lipase: 64 U/L (ref 14–72)

## 2024-03-31 NOTE — Telephone Encounter (Signed)
 These have now resulted.

## 2024-04-08 ENCOUNTER — Ambulatory Visit: Admitting: Family

## 2024-04-08 ENCOUNTER — Ambulatory Visit (HOSPITAL_BASED_OUTPATIENT_CLINIC_OR_DEPARTMENT_OTHER)

## 2024-04-08 VITALS — BP 134/62 | HR 55 | Temp 98.6°F | Resp 16 | Ht 67.0 in | Wt 173.0 lb

## 2024-04-08 DIAGNOSIS — E118 Type 2 diabetes mellitus with unspecified complications: Secondary | ICD-10-CM | POA: Diagnosis not present

## 2024-04-08 DIAGNOSIS — Z23 Encounter for immunization: Secondary | ICD-10-CM | POA: Diagnosis not present

## 2024-04-08 DIAGNOSIS — B3731 Acute candidiasis of vulva and vagina: Secondary | ICD-10-CM

## 2024-04-08 DIAGNOSIS — Z7985 Long-term (current) use of injectable non-insulin antidiabetic drugs: Secondary | ICD-10-CM

## 2024-04-08 DIAGNOSIS — D7589 Other specified diseases of blood and blood-forming organs: Secondary | ICD-10-CM

## 2024-04-08 MED ORDER — FLUCONAZOLE 150 MG PO TABS: ORAL_TABLET | ORAL | 0 refills | Status: DC

## 2024-04-08 MED ORDER — TIRZEPATIDE 5 MG/0.5ML ~~LOC~~ SOAJ: 5.0000 mg | SUBCUTANEOUS | 1 refills | Status: DC

## 2024-04-08 NOTE — Assessment & Plan Note (Signed)
 Reports last A1C 5.5 recently at work. Abdominal pain resolved after stopping Mounjaro  15mg . She is bothered by increased appetite. - Restart Mounjaro  at 5 mg. - Monitor for abdominal pain recurrence; discontinue and notify us  if pain returns. - Send Mounjaro  prescription to [facility]. - Check urine for protein to screen for diabetic nephropathy. She will send her lipid panel from work.

## 2024-04-08 NOTE — Progress Notes (Signed)
 Subjective:     Patient ID: Tanya Wilcox, female    DOB: [birth year], 53 y.o.   MRN: [id number]  Chief Complaint  Patient presents with   Hypertension    Here for follow up    Hypertension    Discussed the use of AI scribe software for clinical note transcription with the patient, who gave verbal consent to proceed.  History of Present Illness  Tanya Wilcox is a 53 year old female with hypertension and diabetes who presents for follow-up on blood pressure and abdominal pain.  She has been off track with her blood pressure medications, though specific medications are not mentioned. Her abdominal pain has resolved after discontinuing Mounjaro , which she had been taking for diabetes management. Her appetite has significantly returned since stopping the medication. Her last hemoglobin A1c was 5.5% in August. She has been off Mounjaro  for two weeks.  She experiences recurrent yeast infections, which she associates with her diabetes. She currently has symptoms of a yeast infection and is seeking treatment.  She has difficulty sleeping, often waking up in the middle of the night and staying awake for a couple of hours, which she attributes to her long work hours from 7 AM to 9 PM. She feels tired and has not been engaging in regular exercise due to her busy schedule.  She recalls having an abnormal CBC with larger red blood cells, which she attributes to alcohol consumption and is aware of a possible B12 deficiency. She is due for a mammogram in a month and has not yet scheduled the appointment.  Lab Results  Component Value Date   CHOL 117 01/08/2023   HDL 50.30 01/08/2023   LDLCALC 53 01/08/2023   TRIG 67.0 01/08/2023   CHOLHDL 2 01/08/2023      Health Maintenance Due  Topic Date Due   Diabetic kidney evaluation - Urine ACR  09/15/2013   Pneumococcal Vaccine: 50+ Years (2 of 2 - PCV) 01/20/2014   HEMOGLOBIN A1C  02/04/2024   COVID-19 Vaccine (5 - 2025-26 season) 03/23/2024    Mammogram  05/05/2024    Past Medical History:  Diagnosis Date   Alcohol use    Allergy    Anxiety    on meds   Bipolar disorder (HCC)    Depression    on meds   Diabetes (HCC)    TYPE 2 ---NO MEDS -- diet controlled   Gestational diabetes    borderline II   Hyperlipidemia    on meds   Hypertension    on meds   Osteoarthritis    RIGHT knee   Tobacco abuse     Past Surgical History:  Procedure Laterality Date   CHOLECYSTECTOMY  2003   COLONOSCOPY N/A 12/17/2023   Procedure: COLONOSCOPY;  Surgeon: San Sandor GAILS, DO;  Location: [facility];  Service: Gastroenterology;  Laterality: N/A;   GASTRIC ROUX-EN-Y N/A 06/29/2019   Procedure: LAPAROSCOPIC ROUX-EN-Y GASTRIC BYPASS WITH UPPER ENDOSCOPY, ERAS PATHWAY;  Surgeon: Ethyl Lenis, MD;  Location: [facility];  Service: General;  Laterality: N/A;   WISDOM TOOTH EXTRACTION      Family History  Problem Relation Age of Onset   Hypertension Mother    Hyperlipidemia Mother    Colon polyps Mother 90   Alcohol abuse Father    Hyperlipidemia Sister    Hypertension Sister    Hypertension Brother    Cancer Brother        multiple myeloma   Retinal detachment Daughter  Diabetes Neg Hx    Colon cancer Neg Hx    Rectal cancer Neg Hx    Stomach cancer Neg Hx    Esophageal cancer Neg Hx     Social History   Socioeconomic History   Marital status: Single    Spouse name: Not on file   Number of children: Not on file   Years of education: Not on file   Highest education level: Some college, no degree  Occupational History   Occupation: IT trainer: [employer]  Tobacco Use   Smoking status: Every Day    Current packs/day: 1.50    Types: Cigarettes   Smokeless tobacco: Never  Vaping Use   Vaping status: Never Used  Substance and Sexual Activity   Alcohol use: Yes    Alcohol/week: 2.0 standard drinks of alcohol    Types: 2 Standard drinks or equivalent per week    Comment: every day   Drug use: Yes     Types: Marijuana    Comment: some marijuana   Sexual activity: Not on file  Other Topics Concern   Not on file  Social History Narrative   Lives with daughter   Works at [employer] and [employer]   Enjoys sleeping   1 dog   Completed high school, some college   Social Drivers of Health   Financial Resource Strain: High Risk (03/24/2024)   Overall Financial Resource Strain (CARDIA)    Difficulty of Paying Living Expenses: Very hard  Food Insecurity: No Food Insecurity (03/24/2024)   Hunger Vital Sign    Worried About Running Out of Food in the Last Year: Never true    Ran Out of Food in the Last Year: Never true  Transportation Needs: No Transportation Needs (03/24/2024)   PRAPARE - Administrator, Civil Service (Medical): No    Lack of Transportation (Non-Medical): No  Physical Activity: Inactive (03/24/2024)   Exercise Vital Sign    Days of Exercise per Week: 0 days    Minutes of Exercise per Session: Not on file  Stress: Stress Concern Present (03/24/2024)   [employer] of Occupational Health - Occupational Stress Questionnaire    Feeling of Stress: Very much  Social Connections: Moderately Isolated (03/24/2024)   Social Connection and Isolation Panel    Frequency of Communication with Friends and Family: Once a week    Frequency of Social Gatherings with Friends and Family: Once a week    Attends Religious Services: More than 4 times per year    Active Member of [employer] or Organizations: Yes    Attends Engineer, structural: More than 4 times per year    Marital Status: Never married  Intimate Partner Violence: Not on file    Outpatient Medications Prior to Visit  Medication Sig Dispense Refill   amLODipine  (NORVASC ) 5 MG tablet Take 1 tablet (5 mg total) by mouth daily. 90 tablet 1   ARIPiprazole (ABILIFY) 5 MG tablet Take 5 mg by mouth daily.   1   atorvastatin  (LIPITOR) 10 MG tablet Take 1 tablet (10 mg total) by mouth daily. 90 tablet 0   Blood  Glucose Monitoring Suppl (ONETOUCH VERIO) w/Device KIT USE TO TEST BLOOD SUGAR THREE TIMES DAILY 1 kit 0   furosemide  (LASIX ) 20 MG tablet TAKE 1 TABLET BY MOUTH DAILY AS  NEEDED 90 tablet 3   levonorgestrel (MIRENA) 20 MCG/24HR IUD 1 each by Intrauterine route once.     lisinopril  (ZESTRIL ) 10  MG tablet TAKE 1 TABLET(10 MG) BY MOUTH DAILY 90 tablet 1   metoprolol  succinate (TOPROL -XL) 50 MG 24 hr tablet TAKE 1 TABLET BY MOUTH DAILY  TAKE WITH OR IMMEDIATELY  FOLLOWING A MEAL 90 tablet 3   Multiple Vitamins-Minerals (BARIATRIC MULTIVITAMINS/IRON PO) Take 2 tablets by mouth 2 (two) times daily.     ONETOUCH VERIO test strip CHECK BLOOD SUGAR THREE TIMES DAILY 200 strip 1   pantoprazole  (PROTONIX ) 40 MG tablet Take 1 tablet (40 mg total) by mouth daily as needed. 90 tablet 1   sertraline  (ZOLOFT ) 25 MG tablet Take 1 tablet (25 mg total) by mouth daily. 90 tablet 1   valACYclovir  (VALTREX ) 1000 MG tablet TAKE 1 TABLET BY MOUTH DAILY 90 tablet 0   tirzepatide  (MOUNJARO ) 10 MG/0.5ML Pen INJECT THE CONTENTS OF ONE PEN  SUBCUTANEOUSLY WEEKLY AS  DIRECTED 6 mL 0   ALPRAZolam  (XANAX ) 0.5 MG tablet Take 0.5 mg by mouth 2 (two) times daily as needed for anxiety.   1   No facility-administered medications prior to visit.    Allergies  Allergen Reactions   Januvia  [Sitagliptin ] Other (See Comments)    Yeast infections   Trazodone Other (See Comments)    ROS See HPI    Objective:    Physical Exam Constitutional:      General: She is not in acute distress.    Appearance: Normal appearance. She is well-developed.  HENT:     Head: Normocephalic and atraumatic.     Right Ear: External ear normal.     Left Ear: External ear normal.  Eyes:     General: No scleral icterus. Neck:     Thyroid : No thyromegaly.  Cardiovascular:     Rate and Rhythm: Normal rate and regular rhythm.     Heart sounds: Normal heart sounds. No murmur heard. Pulmonary:     Effort: Pulmonary effort is normal. No  respiratory distress.     Breath sounds: Normal breath sounds. No wheezing.  Musculoskeletal:     Cervical back: Neck supple.  Skin:    General: Skin is warm and dry.  Neurological:     Mental Status: She is alert and oriented to person, place, and time.  Psychiatric:        Mood and Affect: Mood normal.        Behavior: Behavior normal.        Thought Content: Thought content normal.        Judgment: Judgment normal.      BP 134/62 (BP Location: Right Arm, Patient Position: Sitting, Cuff Size: Small)   Pulse (!) 55   Temp 98.6 F (37 C) (Oral)   Resp 16   Ht 5' 7 (1.702 m)   Wt 173 lb (78.5 kg)   SpO2 100%   BMI 27.10 kg/m  Wt Readings from Last 3 Encounters:  04/08/24 173 lb (78.5 kg)  03/24/24 172 lb 6.4 oz (78.2 kg)  12/17/23 181 lb (82.1 kg)       Assessment & Plan:   Problem List Items Addressed This Visit       Unprioritized   Controlled type 2 diabetes mellitus with complication, without long-term current use of insulin  (HCC) - Primary   Reports last A1C 5.5 recently at work. Abdominal pain resolved after stopping Mounjaro  15mg . She is bothered by increased appetite. - Restart Mounjaro  at 5 mg. - Monitor for abdominal pain recurrence; discontinue and notify us  if pain returns. - Send Mounjaro  prescription to [facility]. -  Check urine for protein to screen for diabetic nephropathy. She will send her lipid panel from work.       Relevant Medications   tirzepatide  (MOUNJARO ) 5 MG/0.5ML Pen   Other Visit Diagnoses       Vaginal candidiasis       Relevant Medications   fluconazole  (DIFLUCAN ) 150 MG tablet     Macrocytosis       Relevant Orders   B12     Needs flu shot       Relevant Orders   Flu vaccine trivalent PF, 6mos and older(Flulaval,Afluria,Fluarix,Fluzone) (Completed)       I have discontinued Crystel Y. Code's ALPRAZolam , fluconazole , fluconazole , and Mounjaro . I am also having her start on tirzepatide  and fluconazole . Additionally, I am  having her maintain her levonorgestrel, ARIPiprazole, OneTouch Verio, Multiple Vitamins-Minerals (BARIATRIC MULTIVITAMINS/IRON PO), sertraline , OneTouch Verio, atorvastatin , metoprolol  succinate, furosemide , lisinopril , amLODipine , pantoprazole , and valACYclovir .  Meds ordered this encounter  Medications   tirzepatide  (MOUNJARO ) 5 MG/0.5ML Pen    Sig: Inject 5 mg into the skin once a week.    Dispense:  6 mL    Refill:  1    Supervising Provider:   DOMENICA BLACKBIRD A [[id number]]   fluconazole  (DIFLUCAN ) 150 MG tablet    Sig: Take 1 tab by mouth today, repeat in 3 days if symptoms are not resolved.    Dispense:  2 tablet    Refill:  0    Supervising Provider:   DOMENICA BLACKBIRD A [[id number]]

## 2024-04-08 NOTE — Patient Instructions (Signed)
 VISIT SUMMARY:  Today, we discussed your blood pressure, diabetes management, recurrent yeast infections, sleep issues, and general health maintenance. We also addressed your concerns about macrocytosis and possible B12 deficiency.  YOUR PLAN:  TYPE 2 DIABETES MELLITUS: Your diabetes is well-managed with a recent A1c of 5.5%. You had abdominal pain that resolved after stopping Mounjaro , but your appetite has increased. -Restart Mounjaro  at 5 mg. -Monitor for any recurrence of abdominal pain and discontinue if it returns. -We will send your Mounjaro  prescription to [facility]. -We will check your urine for protein to screen for diabetic kidney issues. -We will review your cholesterol results from [facility].  RECURRENT VAGINAL CANDIDIASIS: You have recurrent yeast infections, possibly linked to your diabetes. -Take Diflucan , one tablet today. Repeat in three days if symptoms persist. -We will send your Diflucan  prescription to [facility] on [facility].  HYPERTENSION: Your blood pressure is well-controlled.  MACROCYTOSIS: You have slightly larger red blood cells, possibly due to a B12 deficiency from alcohol consumption. -Please discontinue use of alcohol. -We will order a B12 level test at [facility]. -If a B12 deficiency is confirmed, we will start you on B12 supplements.  INSOMNIA: You have difficulty sleeping, likely due to your work schedule and stress. -Follow up if your sleep issues persist.  GENERAL HEALTH MAINTENANCE: We discussed vaccinations for hepatitis B, flu, and pneumonia. -You received the hepatitis B vaccine today. Schedule the second dose in one month. -You received the flu shot today. -We will plan to give you the Prevnar 20 pneumonia shot at your next visit in one month.

## 2024-04-09 ENCOUNTER — Encounter: Payer: Self-pay | Admitting: Family

## 2024-04-09 ENCOUNTER — Ambulatory Visit: Payer: Self-pay | Admitting: Family

## 2024-04-09 LAB — VITAMIN B12: Vitamin B-12: 905 pg/mL (ref 232–1245)

## 2024-04-17 ENCOUNTER — Encounter: Payer: Self-pay | Admitting: Family

## 2024-04-17 ENCOUNTER — Other Ambulatory Visit (HOSPITAL_COMMUNITY): Payer: Self-pay

## 2024-04-20 ENCOUNTER — Other Ambulatory Visit (HOSPITAL_COMMUNITY): Payer: Self-pay

## 2024-04-20 NOTE — Telephone Encounter (Signed)
 Pharmacy Patient Advocate Encounter  Received notification from [facility] that Prior Authorization for Mounjaro  5MG /0.5ML auto-injectors has been APPROVED from 04/20/2024 to 04/20/2025. Ran test claim, Copay is $30. This test claim was processed through [facility]- copay amounts may vary at other pharmacies due to pharmacy/plan contracts, or as the patient moves through the different stages of their insurance plan.   PA #/Case ID/Reference #: [id number]

## 2024-04-22 ENCOUNTER — Other Ambulatory Visit: Payer: Self-pay | Admitting: Family

## 2024-04-22 DIAGNOSIS — I10 Essential (primary) hypertension: Secondary | ICD-10-CM

## 2024-04-23 ENCOUNTER — Other Ambulatory Visit: Payer: Self-pay | Admitting: Family

## 2024-04-23 DIAGNOSIS — A6000 Herpesviral infection of urogenital system, unspecified: Secondary | ICD-10-CM

## 2024-05-08 ENCOUNTER — Ambulatory Visit

## 2024-06-21 ENCOUNTER — Other Ambulatory Visit: Payer: Self-pay | Admitting: Family

## 2024-07-06 ENCOUNTER — Other Ambulatory Visit: Payer: Self-pay | Admitting: Family

## 2024-07-06 DIAGNOSIS — B3731 Acute candidiasis of vulva and vagina: Secondary | ICD-10-CM

## 2024-07-08 ENCOUNTER — Ambulatory Visit: Admitting: Family

## 2024-07-30 ENCOUNTER — Encounter: Payer: Self-pay | Admitting: Family

## 2024-07-30 DIAGNOSIS — E785 Hyperlipidemia, unspecified: Secondary | ICD-10-CM

## 2024-07-30 MED ORDER — ATORVASTATIN CALCIUM 10 MG PO TABS: 10.0000 mg | ORAL_TABLET | Freq: Every day | ORAL | 1 refills | Status: AC

## 2024-08-12 ENCOUNTER — Other Ambulatory Visit (HOSPITAL_COMMUNITY)
Admission: RE | Admit: 2024-08-12 | Discharge: 2024-08-12 | Disposition: A | Discharge status: Home / Self Care | Source: Ambulatory Visit | Attending: Family | Admitting: Family

## 2024-08-12 ENCOUNTER — Ambulatory Visit: Admitting: Family

## 2024-08-12 VITALS — BP 158/56 | HR 67 | Temp 98.4°F | Resp 16 | Ht 67.0 in | Wt 192.0 lb

## 2024-08-12 DIAGNOSIS — Z23 Encounter for immunization: Secondary | ICD-10-CM

## 2024-08-12 DIAGNOSIS — N898 Other specified noninflammatory disorders of vagina: Secondary | ICD-10-CM | POA: Diagnosis present

## 2024-08-12 DIAGNOSIS — Z7985 Long-term (current) use of injectable non-insulin antidiabetic drugs: Secondary | ICD-10-CM

## 2024-08-12 DIAGNOSIS — F419 Anxiety disorder, unspecified: Secondary | ICD-10-CM | POA: Diagnosis not present

## 2024-08-12 DIAGNOSIS — F3170 Bipolar disorder, currently in remission, most recent episode unspecified: Secondary | ICD-10-CM | POA: Diagnosis not present

## 2024-08-12 DIAGNOSIS — A6000 Herpesviral infection of urogenital system, unspecified: Secondary | ICD-10-CM

## 2024-08-12 DIAGNOSIS — E785 Hyperlipidemia, unspecified: Secondary | ICD-10-CM | POA: Diagnosis not present

## 2024-08-12 DIAGNOSIS — I10 Essential (primary) hypertension: Secondary | ICD-10-CM | POA: Diagnosis not present

## 2024-08-12 DIAGNOSIS — E118 Type 2 diabetes mellitus with unspecified complications: Secondary | ICD-10-CM | POA: Diagnosis not present

## 2024-08-12 DIAGNOSIS — Z113 Encounter for screening for infections with a predominantly sexual mode of transmission: Secondary | ICD-10-CM

## 2024-08-12 DIAGNOSIS — F32A Depression, unspecified: Secondary | ICD-10-CM

## 2024-08-12 MED ORDER — SERTRALINE HCL 25 MG PO TABS: 25.0000 mg | ORAL_TABLET | Freq: Every day | ORAL | 1 refills | Status: AC

## 2024-08-12 MED ORDER — TIRZEPATIDE 2.5 MG/0.5ML ~~LOC~~ SOAJ: 2.5000 mg | SUBCUTANEOUS | 0 refills | Status: AC

## 2024-08-12 MED ORDER — ARIPIPRAZOLE 5 MG PO TABS: 5.0000 mg | ORAL_TABLET | Freq: Every day | ORAL | 1 refills | Status: AC

## 2024-08-12 NOTE — Patient Instructions (Addendum)
 Here are some numbers to call for local psychiatrists: Psychiatric Services:  [facility]) [phone] [facility] [place]) - [phone] Dr. Em[facility]) [phone] [facility]) - [phone] [facility], [address], [place], [place] [phone]   VISIT SUMMARY: During your visit, we discussed your elevated blood pressure, diabetes management, vaginal symptoms, mood changes, and general health maintenance. We performed tests and made adjustments to your medications to help manage your conditions more effectively.  YOUR PLAN: -ACUTE VAGINITIS WITH CANDIDIASIS: You have a yeast infection and possibly bacterial vaginosis. We performed a vaginal swab to test for these infections and other sexually transmitted infections. We also ordered blood tests for HIV, syphilis, and hepatitis C.  -HYPERTENSION: Your blood pressure is elevated, and you have not been taking your medication consistently. It is important to take your lisinopril  10 mg daily, including on weekends. We will recheck your blood pressure in one month.  -TYPE 2 DIABETES MELLITUS WITH UNSPECIFIED COMPLICATIONS: Your blood sugar levels are being monitored, and we checked your A1c level today. You should contact your pharmacy to fill your Mounjaro  2.5 mg prescription and start taking it once your current symptoms resolve.  -DEPRESSION AND ANXIETY: You have been experiencing mood changes after stopping your medications. We sent refills for Abilify  5 mg and Sertraline  25 mg to your pharmacy and provided names of psychiatrists for follow-up. It is important to resume your psychiatric medications.  -GENERAL HEALTH MAINTENANCE: You received hepatitis and pneumonia vaccinations today. Your mammogram is scheduled, and we requested the report from [facility] for an update in May.  INSTRUCTIONS: Please  follow up in one month to recheck your blood pressure. Ensure you take your medications as prescribed and contact your pharmacy to fill your prescriptions. Resume your psychiatric medications and follow up with a psychiatrist. Start Mounjaro  2.5 mg after your current symptoms resolve. We will update your mammogram report in May.    Contains text generated by [employer].

## 2024-08-12 NOTE — Assessment & Plan Note (Addendum)
" °  Blood pressure elevated. Non-adherence to medication on weekends. Weight gain may contribute. Current lisinopril  dose is low. - Continue lisinopril  10 mg daily. (Recommended increase to 20 but she wants to hold off for now). - Encouraged adherence to medication regimen, especially on weekends. - Recheck blood pressure in one month. "

## 2024-08-12 NOTE — Progress Notes (Incomplete)
" ° °  Established Patient Office Visit  Subjective   Patient ID: Tanya Wilcox, female    DOB: 1970/10/10  Age: 54 y.o. MRN: [id number]  Student did not document on this patient. Please disregard.  "

## 2024-08-12 NOTE — Progress Notes (Unsigned)
 "  Subjective:     Patient ID: Tanya Wilcox, female    DOB: December 30, 1970, 54 y.o.   MRN: [id number]  Chief Complaint  Patient presents with   Diabetes    Here for follow up   Hypertension    Here for follow here for follow up   Vaginal Discharge    Patient complains of vaginal discharge and odor for about 2 weeks   Fatigue    Patient complains of feeling very tired     HPI  Discussed the use of AI scribe software for clinical note transcription with the patient, who gave verbal consent to proceed.  History of Present Illness Tanya Wilcox is a 54 year old female with hypertension and diabetes who presents with elevated blood pressure and medication concerns.  She has been experiencing elevated blood pressure readings of 143/62 and 158/58. She skips her blood pressure medication, lisinopril  10 mg daily, two days a week, feeling she is taking too much medication. She keeps her medication at work, leading to missed doses on weekends. She smokes and is considering taking a third job.  Regarding her diabetes, her last blood sugar readings were 122 and 98. She experienced stomach upset with Mounjaro  10 mg, leading to a dose reduction that was not filled due to insurance issues. She has not taken Mounjaro  for several months. She reports weight gain, reaching 206 pounds three weeks ago, now down to 192 pounds, attributing the weight gain to her elevated blood pressure.  She reports a fishy vaginal odor with itching for two weeks, suspecting yeast or bacterial vaginosis. She is sexually active and requests STD testing. No urinary symptoms are present, and she reports no breakouts while taking Valtrex  daily.  She experiences left arm, abdominal, and back pain described as 'nodding up' for two weeks, without taking any medication for relief. She experiences nausea sometimes after eating, unrelated to Mounjaro  use.  Her PHQ-9 score is 12 and GAD-7 score is 14, indicating struggles with anxiety and  depression. She stopped taking Abilify  and Zoloft  over a month ago due to feeling overwhelmed by medication burden. She is not currently seeing her psychiatrist or therapist.  She consumes alcohol occasionally, with one beer yesterday and none since Saturday. She is trying to reduce her alcohol intake.  She has not had a mammogram since October 2024 and is scheduled for one in April 2027. She believes her IUD is due for removal this year and reports no menstrual periods since its insertion. She inquires about the safety of a carnivore diet, mentioning she plans to include some produce like kale in her meals.  BP Readings from Last 3 Encounters:  08/12/24 (!) 158/56  04/08/24 134/62  03/24/24 (!) 143/67        Health Maintenance Due  Topic Date Due   Diabetic kidney evaluation - Urine ACR  09/15/2013   COVID-19 Vaccine (5 - 2025-26 season) 03/23/2024   Mammogram  05/05/2024    Past Medical History:  Diagnosis Date   Alcohol use    Allergy    Anxiety    on meds   Bipolar disorder (HCC)    Depression    on meds   Diabetes (HCC)    TYPE 2 ---NO MEDS -- diet controlled   Gestational diabetes    borderline II   Hyperlipidemia    on meds   Hypertension    on meds   Osteoarthritis    RIGHT knee   Tobacco abuse  Past Surgical History:  Procedure Laterality Date   CHOLECYSTECTOMY  2003   COLONOSCOPY N/A 12/17/2023   Procedure: COLONOSCOPY;  Surgeon: San Sandor GAILS, DO;  Location: [facility];  Service: Gastroenterology;  Laterality: N/A;   GASTRIC ROUX-EN-Y N/A 06/29/2019   Procedure: LAPAROSCOPIC ROUX-EN-Y GASTRIC BYPASS WITH UPPER ENDOSCOPY, ERAS PATHWAY;  Surgeon: Ethyl Lenis, MD;  Location: [facility];  Service: General;  Laterality: N/A;   WISDOM TOOTH EXTRACTION      Family History  Problem Relation Age of Onset   Hypertension Mother    Hyperlipidemia Mother    Colon polyps Mother 67   Alcohol abuse Father    Hyperlipidemia Sister    Hypertension Sister     Hypertension Brother    Cancer Brother        multiple myeloma   Retinal detachment Daughter    Diabetes Neg Hx    Colon cancer Neg Hx    Rectal cancer Neg Hx    Stomach cancer Neg Hx    Esophageal cancer Neg Hx     Social History   Socioeconomic History   Marital status: Single    Spouse name: Not on file   Number of children: Not on file   Years of education: Not on file   Highest education level: Some college, no degree  Occupational History   Occupation: It Trainer: [employer]  Tobacco Use   Smoking status: Every Day    Current packs/day: 1.50    Types: Cigarettes   Smokeless tobacco: Never  Vaping Use   Vaping status: Never Used  Substance and Sexual Activity   Alcohol use: Yes    Alcohol/week: 2.0 standard drinks of alcohol    Types: 2 Standard drinks or equivalent per week    Comment: every day   Drug use: Yes    Types: Marijuana    Comment: some marijuana   Sexual activity: Not on file  Other Topics Concern   Not on file  Social History Narrative   Lives with daughter   Works at [employer] and [employer]   Enjoys sleeping   1 dog   Completed high school, some college   Social Drivers of Health   Tobacco Use: High Risk (02/27/2024)   Received from [facility]   Patient History    Smoking Tobacco Use: Every Day    Smokeless Tobacco Use: Never    Passive Exposure: Not on file  Financial Resource Strain: High Risk (08/11/2024)   Overall Financial Resource Strain (CARDIA)    Difficulty of Paying Living Expenses: Hard  Food Insecurity: Food Insecurity Present (08/11/2024)   Epic    Worried About Programme Researcher, Broadcasting/film/video in the Last Year: Sometimes true    Ran Out of Food in the Last Year: Sometimes true  Transportation Needs: No Transportation Needs (08/11/2024)   Epic    Lack of Transportation (Medical): No    Lack of Transportation (Non-Medical): No  Physical Activity: Inactive (08/11/2024)   Exercise Vital Sign    Days of  Exercise per Week: 0 days    Minutes of Exercise per Session: Not on file  Stress: Stress Concern Present (08/11/2024)   [employer] of Occupational Health - Occupational Stress Questionnaire    Feeling of Stress: To some extent  Social Connections: Moderately Isolated (08/11/2024)   Social Connection and Isolation Panel    Frequency of Communication with Friends and Family: More than three times a week    Frequency of  Social Gatherings with Friends and Family: Once a week    Attends Religious Services: Never    Database Administrator or Organizations: Yes    Attends Engineer, Structural: More than 4 times per year    Marital Status: Never married  Intimate Partner Violence: Not on file  Depression (PHQ2-9): High Risk (08/12/2024)   Depression (PHQ2-9)    PHQ-2 Score: 15  Alcohol Screen: Medium Risk (08/11/2024)   Alcohol Screen    Last Alcohol Screening Score (AUDIT): 15  Housing: High Risk (08/11/2024)   Epic    Unable to Pay for Housing in the Last Year: Yes    Number of Times Moved in the Last Year: 1    Homeless in the Last Year: No  Utilities: Not on file  Health Literacy: Not on file    Outpatient Medications Prior to Visit  Medication Sig Dispense Refill   amLODipine  (NORVASC ) 5 MG tablet TAKE 1 TABLET BY MOUTH DAILY 90 tablet 3   atorvastatin  (LIPITOR) 10 MG tablet Take 1 tablet (10 mg total) by mouth daily. 90 tablet 1   Blood Glucose Monitoring Suppl (ONETOUCH VERIO) w/Device KIT USE TO TEST BLOOD SUGAR THREE TIMES DAILY 1 kit 0   furosemide  (LASIX ) 20 MG tablet TAKE 1 TABLET BY MOUTH DAILY AS  NEEDED 90 tablet 3   levonorgestrel (MIRENA) 20 MCG/24HR IUD 1 each by Intrauterine route once.     lisinopril  (ZESTRIL ) 10 MG tablet Take 1 tablet (10 mg total) by mouth daily. 90 tablet 1   metoprolol  succinate (TOPROL -XL) 50 MG 24 hr tablet TAKE 1 TABLET BY MOUTH DAILY  TAKE WITH OR IMMEDIATELY  FOLLOWING A MEAL 90 tablet 3   Multiple Vitamins-Minerals (BARIATRIC  MULTIVITAMINS/IRON PO) Take 2 tablets by mouth 2 (two) times daily.     ONETOUCH VERIO test strip CHECK BLOOD SUGAR THREE TIMES DAILY 200 strip 1   pantoprazole  (PROTONIX ) 40 MG tablet TAKE 1 TABLET BY MOUTH DAILY AS  NEEDED 90 tablet 3   valACYclovir  (VALTREX ) 1000 MG tablet Take 1 tablet (1,000 mg total) by mouth daily. 90 tablet 1   tirzepatide  (MOUNJARO ) 5 MG/0.5ML Pen Inject 5 mg into the skin once a week. 6 mL 1   ARIPiprazole  (ABILIFY ) 5 MG tablet Take 5 mg by mouth daily.  (Patient not taking: Reported on 08/12/2024)  1   fluconazole  (DIFLUCAN ) 150 MG tablet TAKE 1 TABLET BY MOUTH TODAY. REPEAT IN 3 DAYS IF SYMPTOMS ARE NOT RESOLVED 2 tablet 0   sertraline  (ZOLOFT ) 25 MG tablet Take 1 tablet (25 mg total) by mouth daily. (Patient not taking: Reported on 08/12/2024) 90 tablet 1   No facility-administered medications prior to visit.    Allergies[1]  ROS   See HPI Objective:    Physical Exam Constitutional:      General: She is not in acute distress.    Appearance: Normal appearance. She is well-developed.  HENT:     Head: Normocephalic and atraumatic.     Right Ear: External ear normal.     Left Ear: External ear normal.  Eyes:     General: No scleral icterus. Neck:     Thyroid : No thyromegaly.  Cardiovascular:     Rate and Rhythm: Normal rate and regular rhythm.     Heart sounds: Normal heart sounds. No murmur heard. Pulmonary:     Effort: Pulmonary effort is normal. No respiratory distress.     Breath sounds: Normal breath sounds. No wheezing.  Musculoskeletal:  Cervical back: Neck supple.  Skin:    General: Skin is warm and dry.  Neurological:     Mental Status: She is alert and oriented to person, place, and time.  Psychiatric:        Attention and Perception: She is attentive.        Mood and Affect: Mood normal. Affect is flat.        Speech: Speech normal.        Behavior: Behavior normal.        Thought Content: Thought content normal.        Cognition  and Memory: Cognition normal.        Judgment: Judgment normal.     Comments: Somewhat irritable      BP (!) 158/56 (BP Location: Right Arm, Patient Position: Sitting)   Pulse 67   Temp 98.4 F (36.9 C)   Resp 16   Ht 5' 7 (1.702 m)   Wt 192 lb (87.1 kg)   SpO2 100%   BMI 30.07 kg/m  Wt Readings from Last 3 Encounters:  08/12/24 192 lb (87.1 kg)  04/08/24 173 lb (78.5 kg)  03/24/24 172 lb 6.4 oz (78.2 kg)   This SmartLink has not been configured with any valid records.   Lab Results  Component Value Date   HGBA1C 5.5 03/19/2024        Assessment & Plan:   Problem List Items Addressed This Visit       Unprioritized   Vaginal discharge - Primary    Suspected bacterial vaginosis. STD screening requested. - Performed vaginal swab for bacterial vaginosis, yeast, gonorrhea, chlamydia, and trichomoniasis. - Ordered blood tests for HIV, syphilis, and hepatitis C.      Relevant Orders   Cervicovaginal ancillary only( )   Hyperlipidemia   Lab Results  Component Value Date   CHOL 134 03/19/2024   HDL 56 03/19/2024   LDLCALC 61 03/19/2024   TRIG 87 03/19/2024   CHOLHDL 2 01/08/2023   LDL at goal, continue lipitor.       HTN (hypertension)    Blood pressure elevated. Non-adherence to medication on weekends. Weight gain may contribute. Current lisinopril  dose is low. - Continue lisinopril  10 mg daily. (Recommended increase to 20 but she wants to hold off for now). - Encouraged adherence to medication regimen, especially on weekends. - Recheck blood pressure in one month.      Genital HSV   Stable with valtrex .       Controlled type 2 diabetes mellitus with complication, without long-term current use of insulin  (HCC)    Nausea after eating, possibly unrelated to Mounjaro . Insurance issues with dose adjustment. - Checked A1c level today. - Advised to contact pharmacy to fill Mounjaro  2.5 mg prescription. - Instructed to start Mounjaro  2.5 mg  after current symptoms resolve.      Relevant Medications   tirzepatide  (MOUNJARO ) 2.5 MG/0.5ML Pen   Other Relevant Orders   Urine Microalbumin w/creat. ratio   HgB A1c   Basic Metabolic Panel (BMET)   Bipolar disorder (HCC)    Mood changes after discontinuing Abilify  and Zoloft . Moderate depression and anxiety indicated by PHQ-9 and GAD-7 scores. Plans to find a new psychiatrist. - Sent refills for Abilify  5 mg and Sertraline  25 mg to local pharmacy. - Provided names of psychiatrists for follow-up. - Encouraged resumption of psychiatric medications.       Anxiety and depression   Relevant Medications   ARIPiprazole  (ABILIFY ) 5 MG tablet  sertraline  (ZOLOFT ) 25 MG tablet   Other Visit Diagnoses       Screening examination for STD (sexually transmitted disease)       Relevant Orders   HIV antibody (with reflex)   RPR   Hepatitis C Antibody     Immunization due       Relevant Orders   Pneumococcal conjugate vaccine 20-valent (Prevnar 20) (Completed)   Heplisav-B  (HepB-CPG) Vaccine (Completed)      Assessment & Plan    General health maintenance Due for hepatitis and pneumonia vaccinations. Mammogram scheduled. - Administered hepatitis and pneumonia vaccinations today. - Requested mammogram report from [facility] for May update.    I have discontinued Tanya GRADE. Kitchings's tirzepatide  and fluconazole . I have also changed her ARIPiprazole . Additionally, I am having her start on tirzepatide . Lastly, I am having her maintain her levonorgestrel, OneTouch Verio, Multiple Vitamins-Minerals (BARIATRIC MULTIVITAMINS/IRON PO), OneTouch Verio, metoprolol  succinate, furosemide , lisinopril , valACYclovir , pantoprazole , amLODipine , atorvastatin , and sertraline .  Meds ordered this encounter  Medications   ARIPiprazole  (ABILIFY ) 5 MG tablet    Sig: Take 1 tablet (5 mg total) by mouth daily.    Dispense:  90 tablet    Refill:  1    Supervising Provider:   DOMENICA BLACKBIRD A  [[id number]]   sertraline  (ZOLOFT ) 25 MG tablet    Sig: Take 1 tablet (25 mg total) by mouth daily.    Dispense:  90 tablet    Refill:  1    Supervising Provider:   DOMENICA BLACKBIRD A [[id number]]   tirzepatide  (MOUNJARO ) 2.5 MG/0.5ML Pen    Sig: Inject 2.5 mg into the skin once a week.    Dispense:  2 mL    Refill:  0    Supervising Provider:   DOMENICA BLACKBIRD A [[id number]]      [1]  Allergies Allergen Reactions   Januvia  [Sitagliptin ] Other (See Comments)    Yeast infections   Trazodone Other (See Comments)   "

## 2024-08-12 NOTE — Assessment & Plan Note (Addendum)
 Lab Results  Component Value Date   CHOL 134 03/19/2024   HDL 56 03/19/2024   LDLCALC 61 03/19/2024   TRIG 87 03/19/2024   CHOLHDL 2 01/08/2023   LDL at goal, continue lipitor.

## 2024-08-12 NOTE — Assessment & Plan Note (Signed)
" °  Nausea after eating, possibly unrelated to Mounjaro . Insurance issues with dose adjustment. - Checked A1c level today. - Advised to contact pharmacy to fill Mounjaro  2.5 mg prescription. - Instructed to start Mounjaro  2.5 mg after current symptoms resolve. "

## 2024-08-12 NOTE — Assessment & Plan Note (Signed)
" °  Suspected bacterial vaginosis. STD screening requested. - Performed vaginal swab for bacterial vaginosis, yeast, gonorrhea, chlamydia, and trichomoniasis. - Ordered blood tests for HIV, syphilis, and hepatitis C. "

## 2024-08-12 NOTE — Assessment & Plan Note (Signed)
Stable with valtrex

## 2024-08-12 NOTE — Assessment & Plan Note (Signed)
" °  Mood changes after discontinuing Abilify  and Zoloft . Moderate depression and anxiety indicated by PHQ-9 and GAD-7 scores. Plans to find a new psychiatrist. - Sent refills for Abilify  5 mg and Sertraline  25 mg to local pharmacy. - Provided names of psychiatrists for follow-up. - Encouraged resumption of psychiatric medications.  "

## 2024-08-13 LAB — CERVICOVAGINAL ANCILLARY ONLY
Bacterial Vaginitis (gardnerella): POSITIVE — AB
Candida Glabrata: NEGATIVE
Candida Vaginitis: NEGATIVE
Chlamydia: NEGATIVE
Comment: NEGATIVE
Comment: NEGATIVE
Comment: NEGATIVE
Comment: NEGATIVE
Comment: NEGATIVE
Comment: NORMAL
Neisseria Gonorrhea: NEGATIVE
Trichomonas: NEGATIVE

## 2024-08-13 LAB — BASIC METABOLIC PANEL WITH GFR
BUN: 8 mg/dL (ref 6–23)
CO2: 27 meq/L (ref 19–32)
Calcium: 10.5 mg/dL (ref 8.4–10.5)
Chloride: 97 meq/L (ref 96–112)
Creatinine, Ser: 0.76 mg/dL (ref 0.40–1.20)
GFR: 89.47 mL/min
Glucose, Bld: 111 mg/dL — ABNORMAL HIGH (ref 70–99)
Potassium: 4.2 meq/L (ref 3.5–5.1)
Sodium: 134 meq/L — ABNORMAL LOW (ref 135–145)

## 2024-08-13 LAB — HEMOGLOBIN A1C: Hgb A1c MFr Bld: 6 % (ref 4.6–6.5)

## 2024-08-14 ENCOUNTER — Ambulatory Visit: Payer: Self-pay | Admitting: Family

## 2024-08-14 DIAGNOSIS — B9689 Other specified bacterial agents as the cause of diseases classified elsewhere: Secondary | ICD-10-CM

## 2024-08-14 LAB — HIV ANTIBODY (ROUTINE TESTING W REFLEX)
HIV 1&2 Ab, 4th Generation: NONREACTIVE
HIV FINAL INTERPRETATION: NEGATIVE

## 2024-08-14 LAB — SYPHILIS: RPR W/REFLEX TO RPR TITER AND TREPONEMAL ANTIBODIES, TRADITIONAL SCREENING AND DIAGNOSIS ALGORITHM: RPR Ser Ql: NONREACTIVE

## 2024-08-14 LAB — HEPATITIS C ANTIBODY: Hepatitis C Ab: NONREACTIVE

## 2024-08-14 MED ORDER — METRONIDAZOLE 0.75 % VA GEL: 1.0000 | Freq: Every day | VAGINAL | 0 refills | Status: AC

## 2024-08-14 NOTE — Progress Notes (Signed)
 Called patient but no answer, left detail voice mail with results and to let patient know about prescription. Patient advised to call back if any questions.

## 2024-08-14 NOTE — Telephone Encounter (Signed)
 Please advise pt that swab shows BV. Rx sent for metrogel .

## 2024-08-15 ENCOUNTER — Other Ambulatory Visit: Payer: Self-pay

## 2024-08-15 ENCOUNTER — Emergency Department (HOSPITAL_BASED_OUTPATIENT_CLINIC_OR_DEPARTMENT_OTHER)
Admission: EM | Admit: 2024-08-15 | Discharge: 2024-08-15 | Disposition: A | Discharge status: Home / Self Care | Attending: Emergency Medicine | Admitting: Emergency Medicine

## 2024-08-15 ENCOUNTER — Emergency Department (HOSPITAL_BASED_OUTPATIENT_CLINIC_OR_DEPARTMENT_OTHER)

## 2024-08-15 ENCOUNTER — Encounter (HOSPITAL_BASED_OUTPATIENT_CLINIC_OR_DEPARTMENT_OTHER): Payer: Self-pay

## 2024-08-15 DIAGNOSIS — I10 Essential (primary) hypertension: Secondary | ICD-10-CM | POA: Diagnosis not present

## 2024-08-15 DIAGNOSIS — R079 Chest pain, unspecified: Secondary | ICD-10-CM | POA: Diagnosis present

## 2024-08-15 DIAGNOSIS — E119 Type 2 diabetes mellitus without complications: Secondary | ICD-10-CM | POA: Diagnosis not present

## 2024-08-15 DIAGNOSIS — R0981 Nasal congestion: Secondary | ICD-10-CM | POA: Diagnosis not present

## 2024-08-15 DIAGNOSIS — F17200 Nicotine dependence, unspecified, uncomplicated: Secondary | ICD-10-CM | POA: Diagnosis not present

## 2024-08-15 DIAGNOSIS — R5383 Other fatigue: Secondary | ICD-10-CM | POA: Diagnosis not present

## 2024-08-15 DIAGNOSIS — R059 Cough, unspecified: Secondary | ICD-10-CM | POA: Insufficient documentation

## 2024-08-15 DIAGNOSIS — R110 Nausea: Secondary | ICD-10-CM | POA: Insufficient documentation

## 2024-08-15 DIAGNOSIS — R072 Precordial pain: Secondary | ICD-10-CM | POA: Insufficient documentation

## 2024-08-15 LAB — CBC
HCT: 41.3 % (ref 36.0–46.0)
Hemoglobin: 14.3 g/dL (ref 12.0–15.0)
MCH: 34 pg (ref 26.0–34.0)
MCHC: 34.6 g/dL (ref 30.0–36.0)
MCV: 98.3 fL (ref 80.0–100.0)
Platelets: 308 10*3/uL (ref 150–400)
RBC: 4.2 MIL/uL (ref 3.87–5.11)
RDW: 11.5 % (ref 11.5–15.5)
WBC: 5.3 10*3/uL (ref 4.0–10.5)
nRBC: 0 % (ref 0.0–0.2)

## 2024-08-15 LAB — BASIC METABOLIC PANEL WITH GFR
Anion gap: 12 (ref 5–15)
BUN: 11 mg/dL (ref 6–20)
CO2: 22 mmol/L (ref 22–32)
Calcium: 9.3 mg/dL (ref 8.9–10.3)
Chloride: 103 mmol/L (ref 98–111)
Creatinine, Ser: 0.82 mg/dL (ref 0.44–1.00)
GFR, Estimated: >60 mL/min
Glucose, Bld: 120 mg/dL — ABNORMAL HIGH (ref 70–99)
Potassium: 4.3 mmol/L (ref 3.5–5.1)
Sodium: 137 mmol/L (ref 135–145)

## 2024-08-15 LAB — RESP PANEL BY RT-PCR (RSV, FLU A&B, COVID)  RVPGX2
Influenza A by PCR: NEGATIVE
Influenza B by PCR: NEGATIVE
Resp Syncytial Virus by PCR: NEGATIVE
SARS Coronavirus 2 by RT PCR: NEGATIVE

## 2024-08-15 LAB — TROPONIN T, HIGH SENSITIVITY
Troponin T High Sensitivity: 10 ng/L (ref 0–19)
Troponin T High Sensitivity: 12 ng/L (ref 0–19)

## 2024-08-15 MED ORDER — ALUM & MAG HYDROXIDE-SIMETH 200-200-20 MG/5ML PO SUSP
30.0000 mL | Freq: Once | ORAL | Status: AC
  Administered 2024-08-15: 30 mL via ORAL
  Filled 2024-08-15: qty 30

## 2024-08-15 NOTE — Discharge Instructions (Signed)

## 2024-08-15 NOTE — ED Provider Notes (Signed)
 "  Emergency Department Provider Note   I have reviewed the triage vital signs and the nursing notes.   HISTORY  Chief Complaint Chest Pain   HPI Tanya Wilcox is a 54 y.o. female with past history reviewed below presents emergency department for evaluation of chest pain.  She describes intermittent mid chest pain with nausea since yesterday.  Episodes lasting for around 10 minutes and then resolving.  No shortness of breath or vomiting.  No diaphoresis.  She has had some cough along with congestion and fatigue for the past 7 days.  No pleuritic pain. No active pain at this time.   Past Medical History:  Diagnosis Date   Alcohol use    Allergy    Anxiety    on meds   Bipolar disorder (HCC)    Depression    on meds   Diabetes (HCC)    TYPE 2 ---NO MEDS -- diet controlled   Gestational diabetes    borderline II   Hyperlipidemia    on meds   Hypertension    on meds   Osteoarthritis    RIGHT knee   Tobacco abuse     Review of Systems  Constitutional: No fever/chills Cardiovascular: Positive chest pain. Respiratory: Denies shortness of breath. Positive cough.  Gastrointestinal: No abdominal pain. Positive nausea, no vomiting.  No diarrhea.  Neurological: Negative for headaches.  ____________________________________________   PHYSICAL EXAM:  VITAL SIGNS: ED Triage Vitals  Encounter Vitals Group     BP 08/15/24 1056 (!) 145/73     Pulse Rate 08/15/24 1056 66     Resp 08/15/24 1056 18     Temp 08/15/24 1056 98.2 F (36.8 C)     Temp Source 08/15/24 1056 Oral     SpO2 08/15/24 1056 100 %   Constitutional: Alert and oriented. Well appearing and in no acute distress. Eyes: Conjunctivae are normal.  Head: Atraumatic. Nose: No congestion/rhinnorhea. Mouth/Throat: Mucous membranes are moist.  Neck: No stridor.  Cardiovascular: Normal rate, regular rhythm. Good peripheral circulation. Grossly normal heart sounds.   Respiratory: Normal respiratory effort.  No  retractions. Lungs CTAB. Gastrointestinal: Soft and nontender. No distention.  Musculoskeletal: No gross deformities of extremities. Neurologic:  Normal speech and language.   ____________________________________________   LABS (all labs ordered are listed, but only abnormal results are displayed)  Labs Reviewed  RESP PANEL BY RT-PCR (RSV, FLU A&B, COVID)  [id number]  BASIC METABOLIC PANEL WITH GFR  CBC  TROPONIN T, HIGH SENSITIVITY   ____________________________________________  EKG   EKG Interpretation Date/Time:  Saturday August 15 2024 10:52:17 EST Ventricular Rate:  73 PR Interval:  116 QRS Duration:  88 QT Interval:  368 QTC Calculation: 406 R Axis:   21  Text Interpretation: Sinus rhythm Borderline short PR interval Abnormal R-wave progression, early transition Borderline repolarization abnormality Baseline wander in lead(s) III V3 Confirmed by Darra Chew [phone]) on 08/15/2024 10:56:32 AM        ____________________________________________  RADIOLOGY  No results found.  ____________________________________________   PROCEDURES  Procedure(s) performed:   Procedures  None  ____________________________________________   INITIAL IMPRESSION / ASSESSMENT AND PLAN / ED COURSE  Pertinent labs & imaging results that were available during my care of the patient were reviewed by me and considered in my medical decision making (see chart for details).   This patient is Presenting for Evaluation of CP, which does require a range of treatment options, and is a complaint that involves a high risk of morbidity  and mortality.  The Differential Diagnoses includes but is not exclusive to acute coronary syndrome, aortic dissection, pulmonary embolism, cardiac tamponade, community-acquired pneumonia, pericarditis, musculoskeletal chest wall pain, etc.   Critical Interventions-    Medications  alum & mag hydroxide-simeth (MAALOX/MYLANTA) 200-200-20 MG/5ML suspension  30 mL (has no administration in time range)    Reassessment after intervention: symptoms improved.   Clinical Laboratory Tests Ordered, included COVID/Flu negative. CBC without anemia. ***  Radiologic Tests Ordered, included CXR. I independently interpreted the images and agree with radiology interpretation.   Cardiac Monitor Tracing which shows NSR.    Social Determinants of Health Risk patient is a smoker.   Consult complete with  Medical Decision Making: Summary:  Patient presents to the emergency department with chest pain intermittently since yesterday.  No acute ischemic change on EKG.  Plan for screening troponins and chest x-ray.  Vitals reassuring. No active pain at this time.   Reevaluation with update and discussion with   ***Considered admission***  Patient's presentation is most consistent with acute presentation with potential threat to life or bodily function.   Disposition:   ____________________________________________  FINAL CLINICAL IMPRESSION(S) / ED DIAGNOSES  Final diagnoses:  None     NEW OUTPATIENT MEDICATIONS STARTED DURING THIS VISIT:  New Prescriptions   No medications on file    Note:  This document was prepared using Dragon voice recognition software and may include unintentional dictation errors.  Fonda Law, MD, [facility] Emergency Medicine  "

## 2024-08-15 NOTE — ED Triage Notes (Signed)
 Intermittent mid chest pain, nausea since yesterday. Denies SHOB or emesis.   Cough, congestion, fatigue for 1 week

## 2024-08-19 ENCOUNTER — Encounter: Payer: Self-pay | Admitting: Cardiovascular Disease

## 2024-08-19 ENCOUNTER — Encounter: Payer: Self-pay | Admitting: Family

## 2024-08-19 ENCOUNTER — Ambulatory Visit: Attending: Internal Medicine | Admitting: Cardiovascular Disease

## 2024-08-19 VITALS — BP 134/84 | HR 78 | Ht 67.0 in | Wt 193.8 lb

## 2024-08-19 DIAGNOSIS — R079 Chest pain, unspecified: Secondary | ICD-10-CM | POA: Insufficient documentation

## 2024-08-19 DIAGNOSIS — Z8249 Family history of ischemic heart disease and other diseases of the circulatory system: Secondary | ICD-10-CM | POA: Diagnosis not present

## 2024-08-19 DIAGNOSIS — I10 Essential (primary) hypertension: Secondary | ICD-10-CM

## 2024-08-19 DIAGNOSIS — Z720 Tobacco use: Secondary | ICD-10-CM

## 2024-08-19 DIAGNOSIS — E782 Mixed hyperlipidemia: Secondary | ICD-10-CM

## 2024-08-19 DIAGNOSIS — R072 Precordial pain: Secondary | ICD-10-CM

## 2024-08-19 MED ORDER — METOPROLOL TARTRATE 25 MG PO TABS: ORAL_TABLET | ORAL | 0 refills | Status: AC

## 2024-08-19 NOTE — Assessment & Plan Note (Signed)
 History of hyperlipidemia on low-dose statin therapy with lipid profile performed 03/19/2024 revealing total cholesterol 134, LDL 61 and HDL 56.

## 2024-08-19 NOTE — Assessment & Plan Note (Signed)
 History of essential hypertension with blood pressure measured today at 134/84.  She is on amlodipine  lisinopril  and metoprolol .

## 2024-08-19 NOTE — Assessment & Plan Note (Signed)
 Long history of tobacco abuse having smoked 1 pack a day for last 40 years recalcitrant to risk factor modification.

## 2024-08-19 NOTE — Patient Instructions (Addendum)
 Medication Instructions:  Your physician recommends that you continue on your current medications as directed. Please refer to the Current Medication list given to you today.  *If you need a refill on your cardiac medications before your next appointment, please call your pharmacy*  Testing/Procedures: See below  Follow-Up: At [facility], you and your health needs are our priority.  As part of our continuing mission to provide you with exceptional heart care, our providers are all part of one team.  This team includes your primary Cardiologist (physician) and Advanced Practice Providers or APPs (Physician Assistants and Nurse Practitioners) who all work together to provide you with the care you need, when you need it.  Your next appointment:   We will see you on an as needed basis.   Provider:   Dorn Lesches, MD    We recommend signing up for the patient portal called MyChart.  Sign up information is provided on this After Visit Summary.  MyChart is used to connect with patients for Virtual Visits (Telemedicine).  Patients are able to view lab/test results, encounter notes, upcoming appointments, etc.  Non-urgent messages can be sent to your provider as well.   To learn more about what you can do with MyChart, go to [email or web address].   Other Instructions   Your cardiac CT will be scheduled at the below location:    [facility] [address]  [place], [place] [address]   If scheduled at the [facility] at [address], please enter the parking lot using the [address] entrance and use the FREE valet service at the patient drop-off area. Enter the building and check-in with registration on the main floor.   Please follow these instructions carefully (unless otherwise directed):  An IV will be required for this test and Nitroglycerin will be given.   On the Night Before the Test: Be sure to Drink plenty of  water. Do not consume any caffeinated/decaffeinated beverages or chocolate 12 hours prior to your test. Do not take any antihistamines 12 hours prior to your test.   On the Day of the Test: Drink plenty of water until 1 hour prior to the test. Do not eat any food 1 hour prior to test. You may take your regular medications prior to the test.  Take metoprolol  (Lopressor ) 25mg  two hours prior to test. If you take Furosemide /Hydrochlorothiazide/Spironolactone/Chlorthalidone, please HOLD on the morning of the test. Patients who wear a continuous glucose monitor MUST remove the device prior to scanning. FEMALES- please wear underwire-free bra if available, avoid dresses & tight clothing      After the Test: Drink plenty of water. After receiving IV contrast, you may experience a mild flushed feeling. This is normal. On occasion, you may experience a mild rash up to 24 hours after the test. This is not dangerous. If this occurs, you can take Benadryl 25 mg, Zyrtec, Claritin, or Allegra and increase your fluid intake. (Patients taking Tikosyn should avoid Benadryl, and may take Zyrtec, Claritin, or Allegra) If you experience trouble breathing, this can be serious. If it is severe call 911 IMMEDIATELY. If it is mild, please call our office.  We will call to schedule your test 2-4 weeks out understanding that some insurance companies will need an authorization prior to the service being performed.   For more information and frequently asked questions, please visit our website : [email or web address]  For non-scheduling related questions, please contact the cardiac imaging nurse navigator  should you have any questions/concerns: Cardiac Imaging Nurse Navigators Direct Office Dial : [phone]   For scheduling needs, including cancellations and rescheduling, please call Brittany, [phone].  For billing questions, please call [phone].

## 2024-08-19 NOTE — Assessment & Plan Note (Signed)
 Father had a heart attack and died in his 18s.

## 2024-08-19 NOTE — Assessment & Plan Note (Signed)
 Patient was seen in the ER for chest pain 08/11/2024.  She has had 3 episodes of chest pain around that time.  Associated with nausea and diaphoresis.  She does have significant risk factors for ischemic heart disease.  She had a chest CTA for cancer screening 12/10/2023 that did not mention coronary calcification.  I am going to get a coronary CTA to rule out an ischemic etiology.

## 2024-08-19 NOTE — Progress Notes (Signed)
 "     08/19/2024 Tanya Wilcox   12-Jul-1971  [id number]  Primary Physician Daryl Setter, NP Primary Cardiologist: Dorn JINNY Lesches MD GENI CODY MADEIRA, FSCAI  HPI:  Tanya Wilcox is a 54 y.o. 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  She is being seen at the request of the emergency room where she presented on 08/11/2024 with chest pain.  Patient also has a history of bipolar disease.  Her coronary risk factors include 40 pack years of tobacco abuse currently smoking 1 pack/day, treated hypertension, diabetes and hyperlipidemia.  Her father died in his 36s of myocardial infarction.  She was seen in the ER on 08/11/2024 with chest pain after having 2 previous episodes within the prior 24 hours.  Her workup was unrevealing including normal troponins and normal EKG.  Given her risk factor profile I am certainly concerned that this may be ischemically mediated.   Active Medications[1]   Allergies[2]  Social History   Socioeconomic History   Marital status: Single    Spouse name: Not on file   Number of children: Not on file   Years of education: Not on file   Highest education level: Some college, no degree  Occupational History   Occupation: It Trainer: [employer]  Tobacco Use   Smoking status: Every Day    Current packs/day: 1.50    Types: Cigarettes   Smokeless tobacco: Never   Tobacco comments:    1 PPD  Vaping Use   Vaping status: Never Used  Substance and Sexual Activity   Alcohol use: Yes    Alcohol/week: 2.0 standard drinks of alcohol    Types: 2 Standard drinks or equivalent per week    Comment: every day   Drug use: Yes    Types: Marijuana    Comment: some marijuana   Sexual activity: Not on file  Other Topics Concern   Not on file  Social History Narrative   Lives with daughter   Works at [employer] and [employer]   Enjoys sleeping   1 dog   Completed high school, some college   Social Drivers of Health   Tobacco Use: High Risk (08/19/2024)   Patient History    Smoking Tobacco Use: Every Day    Smokeless Tobacco Use: Never    Passive Exposure: Not on file  Financial Resource Strain: High Risk (08/11/2024)   Overall Financial Resource Strain (CARDIA)    Difficulty of Paying Living Expenses: Hard  Food Insecurity: Food Insecurity Present (08/11/2024)   Epic    Worried About Programme Researcher, Broadcasting/film/video in the Last Year: Sometimes true    Ran Out of Food in the Last Year: Sometimes true  Transportation Needs: No Transportation Needs (08/11/2024)   Epic    Lack of Transportation (Medical): No    Lack of Transportation (Non-Medical): No  Physical Activity: Inactive (08/11/2024)   Exercise Vital Sign    Days of Exercise per Week: 0 days    Minutes of Exercise per Session: Not on file  Stress: Stress Concern Present (08/11/2024)   [employer] of Occupational Health - Occupational Stress Questionnaire    Feeling of Stress: To some extent  Social Connections: Moderately Isolated (08/11/2024)   Social Connection and Isolation Panel    Frequency of Communication with Friends and Family: More than three times  a week    Frequency of Social Gatherings with Friends and Family: Once a week    Attends Religious Services: Never    Database Administrator or Organizations: Yes    Attends Engineer, Structural: More than 4 times per year    Marital Status: Never married  Intimate Partner Violence: Not on file  Depression (PHQ2-9): High Risk (08/12/2024)   Depression (PHQ2-9)    PHQ-2 Score: 15  Alcohol Screen: Medium Risk (08/11/2024)   Alcohol Screen    Last Alcohol Screening Score (AUDIT): 15  Housing: High Risk (08/11/2024)   Epic    Unable to Pay for Housing in the Last Year: Yes    Number of Times Moved in the Last Year: 1    Homeless in the Last Year: No  Utilities: Not on file  Health  Literacy: Not on file     Review of Systems: General: negative for chills, fever, night sweats or weight changes.  Cardiovascular: negative for chest pain, dyspnea on exertion, edema, orthopnea, palpitations, paroxysmal nocturnal dyspnea or shortness of breath Dermatological: negative for rash Respiratory: negative for cough or wheezing Urologic: negative for hematuria Abdominal: negative for nausea, vomiting, diarrhea, bright red blood per rectum, melena, or hematemesis Neurologic: negative for visual changes, syncope, or dizziness All other systems reviewed and are otherwise negative except as noted above.    Blood pressure 134/84, pulse 78, height 5' 7 (1.702 m), weight 193 lb 12.8 oz (87.9 kg), SpO2 100%.  General appearance: alert and no distress Neck: no adenopathy, no carotid bruit, no JVD, supple, symmetrical, trachea midline, and thyroid  not enlarged, symmetric, no tenderness/mass/nodules Lungs: clear to auscultation bilaterally Heart: regular rate and rhythm, S1, S2 normal, no murmur, click, rub or gallop Extremities: extremities normal, atraumatic, no cyanosis or edema Pulses: 2+ and symmetric Skin: Skin color, texture, turgor normal. No rashes or lesions Neurologic: Grossly normal  EKG not performed today      ASSESSMENT AND PLAN:   Tobacco abuse Long history of tobacco abuse having smoked 1 pack a day for last 40 years recalcitrant to risk factor modification.  Hyperlipidemia History of hyperlipidemia on low-dose statin therapy with lipid profile performed 03/19/2024 revealing total cholesterol 134, LDL 61 and HDL 56.  HTN (hypertension) History of essential hypertension with blood pressure measured today at 134/84.  She is on amlodipine  lisinopril  and metoprolol .  Family history of heart disease Father had a heart attack and died in his 52s.  Chest pain of uncertain etiology Patient was seen in the ER for chest pain 08/11/2024.  She has had 3 episodes of chest  pain around that time.  Associated with nausea and diaphoresis.  She does have significant risk factors for ischemic heart disease.  She had a chest CTA for cancer screening 12/10/2023 that did not mention coronary calcification.  I am going to get a coronary CTA to rule out an ischemic etiology.     Dorn DOROTHA Lesches MD FACP,FACC,FAHA, FSCAI 08/19/2024 3:08 PM    [1]  Current Meds  Medication Sig   amLODipine  (NORVASC ) 5 MG tablet TAKE 1 TABLET BY MOUTH DAILY   ARIPiprazole  (ABILIFY ) 5 MG tablet Take 1 tablet (5 mg total) by mouth daily.   atorvastatin  (LIPITOR) 10 MG tablet Take 1 tablet (10 mg total) by mouth daily.   Blood Glucose Monitoring Suppl (ONETOUCH VERIO) w/Device KIT USE TO TEST BLOOD SUGAR THREE TIMES DAILY   furosemide  (LASIX ) 20 MG tablet TAKE 1 TABLET BY MOUTH DAILY  AS  NEEDED   levonorgestrel (MIRENA) 20 MCG/24HR IUD 1 each by Intrauterine route once.   lisinopril  (ZESTRIL ) 10 MG tablet Take 1 tablet (10 mg total) by mouth daily.   metoprolol  succinate (TOPROL -XL) 50 MG 24 hr tablet TAKE 1 TABLET BY MOUTH DAILY  TAKE WITH OR IMMEDIATELY  FOLLOWING A MEAL   metroNIDAZOLE  (METROGEL ) 0.75 % vaginal gel Place 1 Applicatorful vaginally at bedtime for 5 days.   Multiple Vitamins-Minerals (BARIATRIC MULTIVITAMINS/IRON PO) Take 2 tablets by mouth 2 (two) times daily.   ONETOUCH VERIO test strip CHECK BLOOD SUGAR THREE TIMES DAILY   pantoprazole  (PROTONIX ) 40 MG tablet TAKE 1 TABLET BY MOUTH DAILY AS  NEEDED   sertraline  (ZOLOFT ) 25 MG tablet Take 1 tablet (25 mg total) by mouth daily.   tirzepatide  (MOUNJARO ) 2.5 MG/0.5ML Pen Inject 2.5 mg into the skin once a week.   valACYclovir  (VALTREX ) 1000 MG tablet Take 1 tablet (1,000 mg total) by mouth daily.  [2]  Allergies Allergen Reactions   Januvia  [Sitagliptin ] Other (See Comments)    Yeast infections   Trazodone Other (See Comments)   "

## 2024-08-20 ENCOUNTER — Encounter (HOSPITAL_COMMUNITY): Payer: Self-pay

## 2024-08-20 NOTE — Telephone Encounter (Signed)
 Patient called and advised pcp not in office this week. Patient advised per Dr. Domenica she should definitely follow up with the CT ordered by cardiology. She verbalized understanding.

## 2024-08-21 ENCOUNTER — Ambulatory Visit (HOSPITAL_COMMUNITY)
Admission: RE | Admit: 2024-08-21 | Discharge: 2024-08-21 | Disposition: A | Source: Ambulatory Visit | Attending: Cardiovascular Disease | Admitting: Cardiovascular Disease

## 2024-08-21 DIAGNOSIS — R072 Precordial pain: Secondary | ICD-10-CM | POA: Insufficient documentation

## 2024-08-21 MED ORDER — IOHEXOL 350 MG/ML SOLN
100.0000 mL | Freq: Once | INTRAVENOUS | Status: AC | PRN
  Administered 2024-08-21: 100 mL via INTRAVENOUS

## 2024-08-21 MED ORDER — NITROGLYCERIN 0.4 MG SL SUBL
0.8000 mg | SUBLINGUAL_TABLET | Freq: Once | SUBLINGUAL | Status: AC
  Administered 2024-08-21: 0.8 mg via SUBLINGUAL

## 2024-08-22 ENCOUNTER — Ambulatory Visit: Payer: Self-pay | Admitting: Cardiovascular Disease

## 2024-09-09 ENCOUNTER — Ambulatory Visit: Admitting: Family

## 2024-09-11 ENCOUNTER — Ambulatory Visit: Admitting: Family
